# Patient Record
Sex: Male | Born: 1935 | Race: Black or African American | Hispanic: No | Marital: Married | Smoking: Former smoker
Health system: Southern US, Community
[De-identification: ages and names within clinical notes are randomized; demographics above are authoritative.]

## PROBLEM LIST (undated history)

## (undated) DIAGNOSIS — I679 Cerebrovascular disease, unspecified: Secondary | ICD-10-CM

## (undated) DIAGNOSIS — G459 Transient cerebral ischemic attack, unspecified: Secondary | ICD-10-CM

## (undated) DIAGNOSIS — I639 Cerebral infarction, unspecified: Secondary | ICD-10-CM

## (undated) DIAGNOSIS — G20 Parkinson's disease: Secondary | ICD-10-CM

## (undated) DIAGNOSIS — F0390 Unspecified dementia without behavioral disturbance: Secondary | ICD-10-CM

## (undated) DIAGNOSIS — M5090 Cervical disc disorder, unspecified, unspecified cervical region: Secondary | ICD-10-CM

## (undated) DIAGNOSIS — C801 Malignant (primary) neoplasm, unspecified: Secondary | ICD-10-CM

---

## 2009-01-13 ENCOUNTER — Encounter: Payer: Self-pay | Admitting: Gastroenterology

## 2009-01-13 ENCOUNTER — Ambulatory Visit: Payer: Self-pay | Admitting: Gastroenterology

## 2009-01-13 DIAGNOSIS — Z8719 Personal history of other diseases of the digestive system: Secondary | ICD-10-CM

## 2009-01-13 DIAGNOSIS — R112 Nausea with vomiting, unspecified: Secondary | ICD-10-CM | POA: Insufficient documentation

## 2009-01-13 DIAGNOSIS — R109 Unspecified abdominal pain: Secondary | ICD-10-CM

## 2009-01-14 ENCOUNTER — Encounter: Payer: Self-pay | Admitting: Gastroenterology

## 2009-01-16 ENCOUNTER — Encounter: Payer: Self-pay | Admitting: Gastroenterology

## 2009-01-21 ENCOUNTER — Encounter: Payer: Self-pay | Admitting: Gastroenterology

## 2009-01-21 ENCOUNTER — Ambulatory Visit: Payer: Self-pay | Admitting: Gastroenterology

## 2009-01-21 ENCOUNTER — Ambulatory Visit (HOSPITAL_COMMUNITY): Admission: RE | Admit: 2009-01-21 | Discharge: 2009-01-21 | Payer: Self-pay | Admitting: Gastroenterology

## 2009-01-26 LAB — CONVERTED CEMR LAB
ALT: 18 units/L (ref 0–53)
AST: 24 units/L (ref 0–37)
Amylase: 143 units/L — ABNORMAL HIGH (ref 0–105)
BUN: 11 mg/dL (ref 6–23)
Eosinophils Absolute: 0.1 10*3/uL (ref 0.0–0.7)
Hemoglobin: 13.4 g/dL (ref 13.0–17.0)
Lymphocytes Relative: 17 % (ref 12–46)
Lymphs Abs: 0.9 10*3/uL (ref 0.7–4.0)
Monocytes Absolute: 0.8 10*3/uL (ref 0.1–1.0)
Neutrophils Relative %: 64 % (ref 43–77)
Platelets: 266 10*3/uL (ref 150–400)
Potassium: 5.1 meq/L (ref 3.5–5.3)
RBC: 4.58 M/uL (ref 4.22–5.81)
RDW: 13.9 % (ref 11.5–15.5)
Sodium: 134 meq/L — ABNORMAL LOW (ref 135–145)
Total Protein: 7.9 g/dL (ref 6.0–8.3)
WBC: 5.3 10*3/uL (ref 4.0–10.5)

## 2009-02-04 ENCOUNTER — Telehealth: Payer: Self-pay | Admitting: Gastroenterology

## 2009-03-19 ENCOUNTER — Ambulatory Visit: Payer: Self-pay | Admitting: Gastroenterology

## 2009-03-23 ENCOUNTER — Ambulatory Visit (HOSPITAL_COMMUNITY): Admission: RE | Admit: 2009-03-23 | Discharge: 2009-03-23 | Payer: Self-pay | Admitting: Gastroenterology

## 2009-05-01 ENCOUNTER — Encounter: Payer: Self-pay | Admitting: Gastroenterology

## 2009-05-06 ENCOUNTER — Ambulatory Visit: Payer: Self-pay | Admitting: Gastroenterology

## 2009-05-06 DIAGNOSIS — R110 Nausea: Secondary | ICD-10-CM

## 2009-05-11 DIAGNOSIS — R6881 Early satiety: Secondary | ICD-10-CM

## 2009-05-12 LAB — CONVERTED CEMR LAB
Cortisol - AM: 14.4 ug/dL (ref 4.3–22.4)
Hgb A1c MFr Bld: 6.2 % — ABNORMAL HIGH (ref 4.6–6.1)
TSH: 1.406 microintl units/mL (ref 0.350–4.500)

## 2009-05-18 ENCOUNTER — Encounter (HOSPITAL_COMMUNITY): Admission: RE | Admit: 2009-05-18 | Discharge: 2009-06-17 | Payer: Self-pay | Admitting: Gastroenterology

## 2009-05-18 ENCOUNTER — Encounter: Payer: Self-pay | Admitting: Infectious Diseases

## 2009-06-01 ENCOUNTER — Ambulatory Visit: Payer: Self-pay | Admitting: Gastroenterology

## 2009-06-01 ENCOUNTER — Ambulatory Visit (HOSPITAL_COMMUNITY): Admission: RE | Admit: 2009-06-01 | Discharge: 2009-06-01 | Payer: Self-pay | Admitting: Gastroenterology

## 2009-06-24 ENCOUNTER — Encounter: Payer: Self-pay | Admitting: Gastroenterology

## 2009-10-03 ENCOUNTER — Ambulatory Visit: Payer: Self-pay | Admitting: Cardiology

## 2010-12-14 NOTE — Op Note (Signed)
NAME:  Jeffrey Blair, Jeffrey Blair NO.:  [id number]   MEDICAL RECORD NO.:  [id number]          PATIENT TYPE:  AMB   LOCATION:  DAY                           FACILITY:  [facility]   PHYSICIAN:  Kassie Mends, M.D.      DATE OF BIRTH:  [birth year]   DATE OF PROCEDURE:  DATE OF DISCHARGE:  01/21/2009                               OPERATIVE REPORT   REFERRING Nawaal Alling:  Dr. Charolette Forward   PROCEDURE:  Given capsule endoscopy.   INDICATION FOR EXAM:  [name] is a 77 year old male who presented  with weight loss, nausea, vomiting, and black stools, as well as  abdominal pain.  He had upper endoscopy, which showed small hiatal  hernia, otherwise is unremarkable, given capsule was released into the  second portion of the duodenum.   PATIENT DATA:  Height 74 inches, weight 180 pounds, waist 38, build  normal.  Gastric passage time 0-minute.  Small bowel passage time 4  hours and 55 minutes.   FINDINGS:  No old blood or fresh blood seen in the small intestines.  View slightly obscured for 1 hour to 2 hour due to retained luminal  contents.  Otherwise, no evidence of masses, ulcers, AVMs, or strictures  identified.   DIAGNOSIS:  No source for abdominal pain, nausea, vomiting, black stools  identified.   RECOMMENDATIONS:  [name] has had an extensive GI evaluation to  include colonoscopy and upper endoscopy, most recent is 2008.  He has a  CT scan in May 2010, which showed no acute intraabdominal process.  He  still have biopsies pending from his upper endoscopy.  No additional GI  workup recommended.  If his abdominal pain persists, then would proceed  with exploratory laparotomy.   PATH:  REACTIVE GASTROPATHY. Treated empirically for chronic prostatitis  w/ CIP x 21 days.      Kassie Mends, M.D.  Electronically Signed     SM/MEDQ  D:  01/26/2009  T:  01/27/2009  Job:  [id number]   cc:   Dr. Charolette Forward

## 2010-12-14 NOTE — Op Note (Signed)
NAME:  BERLE, FITZ NO.:  [id number]   MEDICAL RECORD NO.:  [id number]          PATIENT TYPE:  AMB   LOCATION:  DAY                           FACILITY:  [facility]   PHYSICIAN:  Kassie Mends, M.D.      DATE OF BIRTH:  11/13/1927   DATE OF PROCEDURE:  01/21/2009  DATE OF DISCHARGE:  01/21/2009                               OPERATIVE REPORT   REFERRING Jaymen Fetch:  Dr. Charolette Forward in [place], [facility].   PROCEDURE:  Esophagogastroduodenoscopy with cold forceps biopsy and  Givens capsule placement.   INDICATION FOR EXAM:  [name] is a 77 year old male, who presents  with abdominal pain, weight loss, nausea, vomiting, and black tarry  stools.  He reports having blood in his emesis at times.  He has lost 15  pounds.  His black stools were associated with Pepto-Bismol.  He has had  an evaluation at [facility] which include a CT scan of  the abdomen and pelvis on Dec 21, 2008, which showed a thickened distal  esophagus, otherwise was unremarkable.  He has CT angio that showed a  T11 compression fracture remote.  He has had normal liver enzymes,  mildly elevated amylase, and a lipase that was normal.  His hemoglobin  was 12.7 in May 2010.  He has been treated for H. pylori in the past  based on a serology.  He actually had a quarter found in his rectum.  His son, Abdulah Iqbal, helps to manage his medical care.  He denies  any problems swallowing.  He does not use aspirin, BC Goody's powders,  ibuprofen Motrin, or Aleve.  He is on omeprazole twice a day.  He takes  Remeron 30 mg at bedtime.  The patient's last colonoscopy was in  December 2001 that showed mild patchy erythema in the sigmoid colon and  the rectum.  He was treated with mesalamine enemas for 1 month.  He also  had a colonoscopy in August 2008.  He had an upper endoscopy in August  2008 that showed active gastritis.   FINDINGS:  1. Normal esophagus without evidence of  Barrett's mass, erosion,      ulceration, or stricture.  2. A 2-3 cm hiatal hernia.  Linear erosions in the antrum.  Biopsies      obtained via cold forceps to evaluate for H. pylori gastritis.  3. Normal duodenal bulb and second portion of the duodenum.  4. Given capsule released into the second portion of the duodenum.   DIAGNOSIS:  No obvious source for nausea or vomiting identified.  The  patient does have mild gastritis.   RECOMMENDATIONS:  1. Follow dietary instructions for post capsule endoscopy placement.  2. He should add Boost or [employer] Breakfast 3 times a day.  3. He will return to capsule today.  The results would be available      prior to Monday.  4. He should take his omeprazole 20 mg 30 minutes prior to first and      last meal.  We will call him with the results of his biopsies.  5. He has a follow up appointment in 1 month with Dr. Cira Servant regarding      his abdominal pain.  6. No aspirin, NSAIDs, or anticoagulation for 7 days.  He is also      given a handout on gastritis.   MEDICATIONS:  1. Demerol 50 mg IV.  2. Versed 3 mg IV.   PROCEDURE TECHNIQUE:  Physical exam was performed.  Informed consent was  obtained from the patient after explaining the benefits, risks, and  alternatives to the procedure.  The patient was connected to the monitor  and placed in left lateral position.  Continuous oxygen was provided by  nasal cannula and IV medicine administered through an indwelling  cannula.  After administration of sedation, the patient's esophagus was  intubated and scope was advanced under direct visualization to the  second portion of the duodenum.  The scope was removed slowly by  carefully examining the color, texture, anatomy, and integrity of the  mucosa on the way out.  Capsule was introduced under limited  visualization into the second portion of the duodenum.  The scope was  withdrawn.  The patient's esophagus was then again intubated with  the  diagnostic gastroscope and the scope was advanced to the antrum.  Biopsies were obtained.  The scope was removed slowly by carefully  examining the color, texture, anatomy, and integrity of the mucosa on  the way out.  The patient was recovered in endoscopy and discharged home  in satisfactory condition.   PATH:  Reactive gastropathy      Kassie Mends, M.D.  Electronically Signed     SM/MEDQ  D:  01/21/2009  T:  01/22/2009  Job:  [id number]   cc:   Charolette Forward

## 2011-10-03 ENCOUNTER — Encounter (HOSPITAL_COMMUNITY): Payer: Self-pay | Admitting: *Deleted

## 2011-10-03 ENCOUNTER — Emergency Department (HOSPITAL_COMMUNITY)
Admission: EM | Admit: 2011-10-03 | Discharge: 2011-10-03 | Disposition: A | Discharge status: Home / Self Care | Payer: Medicare Other | Attending: Emergency Medicine | Admitting: Emergency Medicine

## 2011-10-03 ENCOUNTER — Emergency Department (HOSPITAL_COMMUNITY): Payer: Medicare Other

## 2011-10-03 DIAGNOSIS — R609 Edema, unspecified: Secondary | ICD-10-CM | POA: Insufficient documentation

## 2011-10-03 DIAGNOSIS — M79609 Pain in unspecified limb: Secondary | ICD-10-CM | POA: Insufficient documentation

## 2011-10-03 DIAGNOSIS — L03119 Cellulitis of unspecified part of limb: Secondary | ICD-10-CM | POA: Insufficient documentation

## 2011-10-03 DIAGNOSIS — L02419 Cutaneous abscess of limb, unspecified: Secondary | ICD-10-CM | POA: Insufficient documentation

## 2011-10-03 DIAGNOSIS — L03116 Cellulitis of left lower limb: Secondary | ICD-10-CM

## 2011-10-03 DIAGNOSIS — Z8546 Personal history of malignant neoplasm of prostate: Secondary | ICD-10-CM | POA: Insufficient documentation

## 2011-10-03 HISTORY — DX: Parkinson's disease: G20

## 2011-10-03 HISTORY — DX: Unspecified dementia, unspecified severity, without behavioral disturbance, psychotic disturbance, mood disturbance, and anxiety: F03.90

## 2011-10-03 HISTORY — DX: Malignant (primary) neoplasm, unspecified: C80.1

## 2011-10-03 LAB — CBC
HCT: 36.3 % — ABNORMAL LOW (ref 39.0–52.0)
Hemoglobin: 12.4 g/dL — ABNORMAL LOW (ref 13.0–17.0)
MCHC: 34.2 g/dL (ref 30.0–36.0)
Platelets: 203 10*3/uL (ref 150–400)
RBC: 4.21 MIL/uL — ABNORMAL LOW (ref 4.22–5.81)
RDW: 13 % (ref 11.5–15.5)
WBC: 5.5 10*3/uL (ref 4.0–10.5)

## 2011-10-03 LAB — URINALYSIS, ROUTINE W REFLEX MICROSCOPIC
Bilirubin Urine: NEGATIVE
Ketones, ur: NEGATIVE mg/dL
Leukocytes, UA: NEGATIVE
Nitrite: NEGATIVE
Protein, ur: NEGATIVE mg/dL
Specific Gravity, Urine: 1.014 (ref 1.005–1.030)
Urobilinogen, UA: 0.2 mg/dL (ref 0.0–1.0)

## 2011-10-03 LAB — COMPREHENSIVE METABOLIC PANEL WITH GFR
ALT: <5 U/L (ref 0–53)
AST: 13 U/L (ref 0–37)
Albumin: 3.4 g/dL — ABNORMAL LOW (ref 3.5–5.2)
Alkaline Phosphatase: 116 U/L (ref 39–117)
BUN: 13 mg/dL (ref 6–23)
CO2: 31 meq/L (ref 19–32)
Calcium: 9.3 mg/dL (ref 8.4–10.5)
Chloride: 98 meq/L (ref 96–112)
Creatinine, Ser: 0.67 mg/dL (ref 0.50–1.35)
GFR calc Af Amer: >90 mL/min
GFR calc non Af Amer: 88 mL/min — ABNORMAL LOW
Glucose, Bld: 138 mg/dL — ABNORMAL HIGH (ref 70–99)
Potassium: 3.7 meq/L (ref 3.5–5.1)
Sodium: 137 meq/L (ref 135–145)
Total Bilirubin: 0.2 mg/dL — ABNORMAL LOW (ref 0.3–1.2)
Total Protein: 7.4 g/dL (ref 6.0–8.3)

## 2011-10-03 LAB — DIFFERENTIAL
Basophils Absolute: 0 K/uL (ref 0.0–0.1)
Basophils Relative: 0 % (ref 0–1)
Eosinophils Absolute: 0.1 K/uL (ref 0.0–0.7)
Eosinophils Relative: 2 % (ref 0–5)
Lymphocytes Relative: 9 % — ABNORMAL LOW (ref 12–46)
Lymphs Abs: 0.5 K/uL — ABNORMAL LOW (ref 0.7–4.0)
Monocytes Absolute: 0.5 K/uL (ref 0.1–1.0)
Monocytes Relative: 9 % (ref 3–12)
Neutro Abs: 4.4 K/uL (ref 1.7–7.7)
Neutrophils Relative %: 80 % — ABNORMAL HIGH (ref 43–77)

## 2011-10-03 LAB — PROCALCITONIN: Procalcitonin: <0.1 ng/mL

## 2011-10-03 MED ORDER — ACETAMINOPHEN 500 MG PO TABS
1000.0000 mg | ORAL_TABLET | Freq: Once | ORAL | Status: DC
  Filled 2011-10-03: qty 2

## 2011-10-03 MED ORDER — SODIUM CHLORIDE 0.9 % IV BOLUS (SEPSIS)
1000.0000 mL | Freq: Once | INTRAVENOUS | Status: AC
  Administered 2011-10-03: 1000 mL via INTRAVENOUS

## 2011-10-03 MED ORDER — HYDROCODONE-ACETAMINOPHEN 5-325 MG PO TABS
2.0000 | ORAL_TABLET | Freq: Once | ORAL | Status: AC
  Administered 2011-10-03: 2 via ORAL
  Filled 2011-10-03: qty 2

## 2011-10-03 MED ORDER — VANCOMYCIN HCL IN DEXTROSE 1-5 GM/200ML-% IV SOLN
1000.0000 mg | Freq: Once | INTRAVENOUS | Status: AC
  Administered 2011-10-03: 1000 mg via INTRAVENOUS
  Filled 2011-10-03: qty 200

## 2011-10-03 MED ORDER — DOXYCYCLINE HYCLATE 100 MG PO CAPS: 100.0000 mg | ORAL_CAPSULE | Freq: Two times a day (BID) | ORAL | Status: AC

## 2011-10-03 NOTE — H&P (Signed)
Requesting physician: Dr. Valma Cava  Reason for consultation: Cellulitis; concerns for inpatient vs outpatient treatment.  History of Present Illness: 78 y/o male with pmh significant for prostate cancer (in remission), parkinson disease and migraines; came to the hospital complaining of LLE pain, erythema and swelling. Patient in ED afebrile; no leukocytosis and with negative leg x-ray for osteomyelitis. Leg X-ray demonstrated and favor soft tissue cellulitis. At this point plan is to discharge patient home on doxycyline and follow up with PCP.  Allergies:   Allergies  Allergen Reactions  . Morphine   . Penicillins       Past Medical History  Diagnosis Date  . Cancer     prostate-in remission  . Dementia   . Parkinson disease   . Migraine     History reviewed. No pertinent past surgical history.  Scheduled Meds:   . HYDROcodone-acetaminophen  2 tablet Oral Once  . sodium chloride  1,000 mL Intravenous Once  . vancomycin  1,000 mg Intravenous Once  . DISCONTD: acetaminophen  1,000 mg Oral Once   Continuous Infusions:  PRN Meds:.  Social History:  reports that he has quit smoking. He does not have any smokeless tobacco history on file. He reports that he does not drink alcohol or use illicit drugs.  History reviewed. No pertinent family history.  Review of Systems:  Denies fever/chills, SOB, N/V, chest pain, abdominal pain or any other complaints. He reports mild anorexia for the last 2 days. Otherwise negative.  Physical Exam: Blood pressure 107/59, pulse 104, temperature 98.3 F (36.8 C), temperature source Oral, resp. rate 24, weight 72.576 kg (160 lb), SpO2 94.00%. GEN: in NAD; laying on bed cooperative with examination. Head: normocephalic, atraumatic. Eyes: no icterus, PERRLA, EOMI Oropharynx: no erythema and no exudate. Neck: supple, no thyromegaly Heart: Regular rhythm, no murmurs, S1 and S2. Resp: CTA bilaterally. Abdomen: soft, NT, ND, positive  BS. Extremities: LLE (anteriro aspect) with erythema, swelling and warm sensation; good pulses bilaterally and otherwise WNL. Neuro:AAOX3; no CN deficit; MS 4/5 bilaterally and simetrically. Psych: appropriate.   Labs on Admission:  Results for orders placed during the hospital encounter of 10/03/11 (from the past 48 hour(s))  CBC     Status: Abnormal   Collection Time   10/03/11 11:55 AM      Component Value Range Comment   WBC 5.5  4.0 - 10.5 (K/uL)    RBC 4.21 (*) 4.22 - 5.81 (MIL/uL)    Hemoglobin 12.4 (*) 13.0 - 17.0 (g/dL)    HCT 16.1 (*) 09.6 - 52.0 (%)    MCV 86.2  78.0 - 100.0 (fL)    MCH 29.5  26.0 - 34.0 (pg)    MCHC 34.2  30.0 - 36.0 (g/dL)    RDW 04.5  40.9 - 81.1 (%)    Platelets 203  150 - 400 (K/uL)   DIFFERENTIAL     Status: Abnormal   Collection Time   10/03/11 11:55 AM      Component Value Range Comment   Neutrophils Relative 80 (*) 43 - 77 (%)    Neutro Abs 4.4  1.7 - 7.7 (K/uL)    Lymphocytes Relative 9 (*) 12 - 46 (%)    Lymphs Abs 0.5 (*) 0.7 - 4.0 (K/uL)    Monocytes Relative 9  3 - 12 (%)    Monocytes Absolute 0.5  0.1 - 1.0 (K/uL)    Eosinophils Relative 2  0 - 5 (%)    Eosinophils Absolute 0.1  0.0 -  0.7 (K/uL)    Basophils Relative 0  0 - 1 (%)    Basophils Absolute 0.0  0.0 - 0.1 (K/uL)   COMPREHENSIVE METABOLIC PANEL     Status: Abnormal   Collection Time   10/03/11 11:55 AM      Component Value Range Comment   Sodium 137  135 - 145 (mEq/L)    Potassium 3.7  3.5 - 5.1 (mEq/L)    Chloride 98  96 - 112 (mEq/L)    CO2 31  19 - 32 (mEq/L)    Glucose, Bld 138 (*) 70 - 99 (mg/dL)    BUN 13  6 - 23 (mg/dL)    Creatinine, Ser 1.61  0.50 - 1.35 (mg/dL)    Calcium 9.3  8.4 - 10.5 (mg/dL)    Total Protein 7.4  6.0 - 8.3 (g/dL)    Albumin 3.4 (*) 3.5 - 5.2 (g/dL)    AST 13  0 - 37 (U/L)    ALT <5  0 - 53 (U/L) REPEATED TO VERIFY   Alkaline Phosphatase 116  39 - 117 (U/L)    Total Bilirubin 0.2 (*) 0.3 - 1.2 (mg/dL)    GFR calc non Af Amer 88 (*) >90  (mL/min)    GFR calc Af Amer >90  >90 (mL/min)   LACTIC ACID, PLASMA     Status: Normal   Collection Time   10/03/11 11:55 AM      Component Value Range Comment   Lactic Acid, Venous 1.6  0.5 - 2.2 (mmol/L)   PROCALCITONIN     Status: Normal   Collection Time   10/03/11 11:55 AM      Component Value Range Comment   Procalcitonin <0.10     URINALYSIS, ROUTINE W REFLEX MICROSCOPIC     Status: Normal   Collection Time   10/03/11  1:17 PM      Component Value Range Comment   Color, Urine YELLOW  YELLOW     APPearance CLEAR  CLEAR     Specific Gravity, Urine 1.014  1.005 - 1.030     pH 6.5  5.0 - 8.0     Glucose, UA NEGATIVE  NEGATIVE (mg/dL)    Hgb urine dipstick NEGATIVE  NEGATIVE     Bilirubin Urine NEGATIVE  NEGATIVE     Ketones, ur NEGATIVE  NEGATIVE (mg/dL)    Protein, ur NEGATIVE  NEGATIVE (mg/dL)    Urobilinogen, UA 0.2  0.0 - 1.0 (mg/dL)    Nitrite NEGATIVE  NEGATIVE     Leukocytes, UA NEGATIVE  NEGATIVE  MICROSCOPIC NOT DONE ON URINES WITH NEGATIVE PROTEIN, BLOOD, LEUKOCYTES, NITRITE, OR GLUCOSE <1000 mg/dL.    Radiological Exams on Admission: Dg Tibia/fibula Left  10/03/2011  *RADIOLOGY REPORT*  Clinical Data: Left lower anterior leg swelling and redness.  LEFT TIBIA AND FIBULA - 2 VIEW  Comparison: None.  Findings: Healed fracture deformities of the tibia and fibula are seen.  There is extensive bony overgrowth and heterotopic ossification.  Osteopenia is seen in the ankle and osteopenia. Soft tissue swelling is seen along the anterior aspect of the lower leg.  Degenerative changes in the ankle.  Vascular calcifications.  IMPRESSION:  1.  Soft tissue swelling along the anterior aspect of the lower leg without underlying evidence of osteomyelitis.  Cellulitis is suggested. 2.  Healed fracture deformities of the tibia and fibula with extensive bony overgrowth and heterotopic ossification.  Original Report Authenticated By: Reyes Ivan, M.D.    Assessment/Plan 1-LLE cellulitis:  no signs of osteomyelitis or extensive tendosynovitis or myositis. Will treat with doxycycline PO for 10 days; provide instructions to keep himself hydrated and to follow with PCP.  The rest of his medical problems remains stable and he will continue current home regimen.  Time Spent on Admission: 55 minutes  Jaceon Heiberger [facility] [phone]  10/03/2011, 3:11 PM

## 2011-10-03 NOTE — Discharge Instructions (Signed)
Cellulitis Cellulitis is an infection of the skin and the tissue beneath it. The area is typically red and tender. It is caused by germs (bacteria) (usually staph or strep) that enter the body through cuts or sores. Cellulitis most commonly occurs in the arms or lower legs.  HOME CARE INSTRUCTIONS   If you are given a prescription for medications which kill germs (antibiotics), take as directed until finished.   If the infection is on the arm or leg, keep the limb elevated as able.   Use a cold cloth several times per day to relieve pain and encourage healing.   See your caregiver for recheck of the infected site as directed if problems arise.   Only take over-the-counter or prescription medicines for pain, discomfort, or fever as directed by your caregiver.  SEEK MEDICAL CARE IF:   The area of redness (inflammation) is spreading, there are red streaks coming from the infected site, or if a part of the infection begins to turn dark in color.   The joint or bone underneath the infected skin becomes painful after the skin has healed.   The infection returns in the same or another area after it seems to have gone away.   A boil or bump swells up. This may be an abscess.   New, unexplained problems such as pain or fever develop.  SEEK IMMEDIATE MEDICAL CARE IF:   You have a fever.   You or your child feels drowsy or lethargic.   There is vomiting, diarrhea, or lasting discomfort or feeling ill (malaise) with muscle aches and pains.  MAKE SURE YOU:   Understand these instructions.   Will watch your condition.  Will get help right away if you are not doing well or get worse. Cellulitis Cellulitis is an infection of the tissue under the skin. The infected area is usually red and tender. This is caused by germs. These germs enter the body through cuts or sores. This usually happens in the arms or lower legs. HOME CARE   Take your medicine as told. Finish it even if you start to feel  better.   If the infection is on the arm or leg, keep it raised (elevated).   Use a warm cloth on the infected area several times a day.   See your doctor for a follow-up visit as told.  GET HELP RIGHT AWAY IF:   You are tired or confused.   You throw up (vomit).   You have watery poop (diarrhea).   You feel ill and have muscle aches.   You have a fever.  MAKE SURE YOU:   Understand these instructions.   Will watch your condition.   Will get help right away if you are not doing well or get worse.  Document Released: 01/04/2008 Document Revised: 07/07/2011 Document Reviewed: 06/19/2009  [facility] Patient Information 2012 [place], [place].

## 2011-10-03 NOTE — ED Provider Notes (Addendum)
History     CSN: [id number]  Arrival date & time 10/03/11  1057   First MD Initiated Contact with Patient 10/03/11 1130      Chief Complaint  Patient presents with  . Leg Pain    pt c/o left leg pain, was referred here by urgent care PA to r/o osteomyelitis. pt c/o pain to palpation and with ambulation. son also reports pt had bone graft years ago in same leg.   Pt has pain in L tib fib for several weeks.  H/o bone graft from football accident in 73's.  Worsening redness and pain in past few days.  Seen at [facility] yesterday, but did not come in until today. No fevers, no vomiting. Lives at home with spouse. Patient denies any recent injury. He has had no open sores. He does normally followed at the [facility] in [place]. Patient states he is "allergic" to morphine, and penicillin, but does not know the exact reaction. No reported history of anaphylaxis. Denies any abdominal or chest pain. No back pain or dysuria.  (Consider location/radiation/quality/duration/timing/severity/associated sxs/prior treatment) HPI  Past Medical History  Diagnosis Date  . Cancer     prostate-in remission  . Dementia   . Parkinson disease   . Migraine     History reviewed. No pertinent past surgical history.  History reviewed. No pertinent family history.  History  Substance Use Topics  . Smoking status: Former Games developer  . Smokeless tobacco: Not on file  . Alcohol Use: No      Review of Systems  All other systems reviewed and are negative.    Allergies  Morphine and Penicillins  Home Medications  No current outpatient prescriptions on file.  BP 107/59  Pulse 104  Temp(Src) 98.3 F (36.8 C) (Oral)  Resp 24  Wt 160 lb (72.576 kg)  SpO2 94%  Physical Exam  Nursing note and vitals reviewed. Constitutional: He is oriented to person, place, and time. He appears well-developed and well-nourished.  HENT:  Head: Normocephalic and atraumatic.  Eyes: Conjunctivae and EOM are normal.  Pupils are equal, round, and reactive to light.  Neck: Neck supple.  Cardiovascular: Normal rate and regular rhythm.  Exam reveals no gallop and no friction rub.   No murmur heard. Pulmonary/Chest: Breath sounds normal. He has no wheezes. He has no rales. He exhibits no tenderness.  Abdominal: Soft. Bowel sounds are normal. He exhibits no distension. There is no tenderness. There is no rebound and no guarding.  Musculoskeletal: Normal range of motion. He exhibits edema and tenderness.       Abnormal bony contour and left anterior tib-fib from previous surgery. Surrounding erythema and tenderness. No obvious crepitance. Distal pulses are intact. No open sores appreciated. Diffusely tender.  Neurological: He is alert and oriented to person, place, and time. No cranial nerve deficit. Coordination normal.  Skin: Skin is warm and dry. No rash noted.  Psychiatric: He has a normal mood and affect.    ED Course  Procedures (including critical care time)   Labs Reviewed  CBC  DIFFERENTIAL  COMPREHENSIVE METABOLIC PANEL  LACTIC ACID, PLASMA  PROCALCITONIN  CULTURE, BLOOD (ROUTINE X 2)  CULTURE, BLOOD (ROUTINE X 2)  URINALYSIS, ROUTINE W REFLEX MICROSCOPIC  URINE CULTURE   No results found.   No diagnosis found.    MDM  Pt is seen and examined;  Initial history and physical completed.  Will follow.   Initial HR of 120.  Initial BP of 107.  IVF, blood cx, imaging, labs, emperic ABX, pain meds.  Likely will need admission.   Will follow.      Horice Carrero A. Patrica Duel, MD 10/03/11 1141  3:12 PM  Discussed with [facility].  I felt the patient should be admitted.  The hospitalist states that they have seen. The patient and performed a full valuation. They felt the patient could go home. I respectfully disagreed. I told the [facility]ist that they could send the patient home, but I recommended that he be fitted. Disposition will be per [facility].  Lorelle Gibbs. Patrica Duel,  MD 10/03/11 [phone]

## 2011-10-03 NOTE — Discharge Summary (Signed)
Physician Discharge Summary  Patient ID: Jeffrey Blair MRN: [id number] DOB/AGE: [id number] 76 y.o.  Admit date: 10/03/2011 Discharge date: 10/03/2011  Primary Care Physician:  No primary provider on file.   Discharge Diagnoses:   1-LLE cellulitis 2-GERD 3-BPH 4-depression/anxiety 5-Parkinson's disease 6-HTN 7-HLD  Medication List  As of 10/03/2011  4:25 PM   TAKE these medications         citalopram 40 MG tablet   Commonly known as: CELEXA   Take 40 mg by mouth daily.      doxycycline 100 MG capsule   Commonly known as: VIBRAMYCIN   Take 1 capsule (100 mg total) by mouth 2 (two) times daily.      LORazepam 0.5 MG tablet   Commonly known as: ATIVAN   Take 0.5 mg by mouth 2 (two) times daily as needed. For anxiety and tremor      mulitivitamin with minerals Tabs   Take 1 tablet by mouth daily.      primidone 250 MG tablet   Commonly known as: MYSOLINE   Take 250 mg by mouth 2 (two) times daily.      propranolol 80 MG tablet   Commonly known as: INDERAL   Take 80 mg by mouth daily.      ranitidine 300 MG capsule   Commonly known as: ZANTAC   Take 300 mg by mouth at bedtime as needed.      simvastatin 20 MG tablet   Commonly known as: ZOCOR   Take 20 mg by mouth every evening.      terazosin 2 MG capsule   Commonly known as: HYTRIN   Take 2 mg by mouth at bedtime.      triamterene-hydrochlorothiazide 75-50 MG per tablet   Commonly known as: MAXZIDE   Take 1 tablet by mouth daily.      vitamin B-12 500 MCG tablet   Commonly known as: CYANOCOBALAMIN   Take 500 mcg by mouth daily.             Disposition and Follow-up:  Patient discharge home on PO antibiotics using doxycycline for 10 days and physical measures to help healing his LEG cellulitis. Advise to arrange visit with PCP in 10 days for follow up.  Consults:  None  Significant Diagnostic Studies:  Dg Tibia/fibula Left  10/03/2011  *RADIOLOGY REPORT*  Clinical Data: Left lower anterior leg  swelling and redness.  LEFT TIBIA AND FIBULA - 2 VIEW  Comparison: None.  Findings: Healed fracture deformities of the tibia and fibula are seen.  There is extensive bony overgrowth and heterotopic ossification.  Osteopenia is seen in the ankle and osteopenia. Soft tissue swelling is seen along the anterior aspect of the lower leg.  Degenerative changes in the ankle.  Vascular calcifications.  IMPRESSION:  1.  Soft tissue swelling along the anterior aspect of the lower leg without underlying evidence of osteomyelitis.  Cellulitis is suggested. 2.  Healed fracture deformities of the tibia and fibula with extensive bony overgrowth and heterotopic ossification.  Original Report Authenticated By: Reyes Ivan, M.D.     Brief H and P: 78 y/o male with pmh significant for prostate cancer (in remission), parkinson disease and migraines; came to the hospital complaining of LLE pain, erythema and swelling. Patient in ED afebrile; no leukocytosis and with negative leg x-ray for osteomyelitis. Leg X-ray demonstrated and favor soft tissue cellulitis.  Assessment/Plan  1-LLE cellulitis: no signs of osteomyelitis or extensive tendosynovitis or myositis. Will treat with doxycycline  PO for 10 days; provide instructions to keep himself hydrated and to follow with PCP.  The rest of his medical problems remains stable and he will continue current home regimen.      Time spent on Discharge: 55 minutes  Signed: Shaughn Thomley 10/03/2011, 4:25 PM

## 2011-10-03 NOTE — ED Notes (Signed)
Pt. Is unable to use the restroom at this time. 

## 2011-10-03 NOTE — ED Notes (Signed)
Admitting MD at bedside.

## 2011-10-04 LAB — URINE CULTURE
Colony Count: NO GROWTH
Culture  Setup Time: 201303050120

## 2011-10-09 LAB — CULTURE, BLOOD (ROUTINE X 2)
Culture: NO GROWTH
Culture: NO GROWTH

## 2015-04-08 ENCOUNTER — Ambulatory Visit (INDEPENDENT_AMBULATORY_CARE_PROVIDER_SITE_OTHER): Payer: PPO | Admitting: Pulmonary Disease

## 2015-04-08 ENCOUNTER — Ambulatory Visit (INDEPENDENT_AMBULATORY_CARE_PROVIDER_SITE_OTHER)
Admission: RE | Admit: 2015-04-08 | Discharge: 2015-04-08 | Disposition: A | Discharge status: Home / Self Care | Payer: PPO | Source: Ambulatory Visit | Attending: Pulmonary Disease | Admitting: Pulmonary Disease

## 2015-04-08 ENCOUNTER — Encounter: Payer: Self-pay | Admitting: Pulmonary Disease

## 2015-04-08 VITALS — BP 122/68 | HR 109 | Ht 73.0 in | Wt 158.0 lb

## 2015-04-08 DIAGNOSIS — R0602 Shortness of breath: Secondary | ICD-10-CM | POA: Diagnosis not present

## 2015-04-08 DIAGNOSIS — R05 Cough: Secondary | ICD-10-CM | POA: Diagnosis not present

## 2015-04-08 DIAGNOSIS — R059 Cough, unspecified: Secondary | ICD-10-CM | POA: Insufficient documentation

## 2015-04-08 DIAGNOSIS — J411 Mucopurulent chronic bronchitis: Secondary | ICD-10-CM

## 2015-04-08 DIAGNOSIS — J449 Chronic obstructive pulmonary disease, unspecified: Secondary | ICD-10-CM | POA: Insufficient documentation

## 2015-04-08 NOTE — Progress Notes (Signed)
Subjective:    Patient ID: Jeffrey Blair, male    DOB: 08-18-1927, 81 y.o.   MRN: [id number]  HPI Chief Complaint  Patient presents with  . Advice Only    self referral for cough.  Pt c/o sob, prod cough with lots of white mucus X4-5 months    This is a very pleasant 81 year old veteran who smoked for many years throughout his life and quit approximately 13 years ago who comes to my clinic today for evaluation of shortness of breath and coughing spells. He has referred himself here though apparently this problem has recently been evaluated at the that [facility]. He says that as a child he had no respiratory illnesses and is not aware of being born prematurely or having an infection as a child. He served in [employer] without respiratory difficulty and remained active as a young adult. He ran a Chiropractor and according to his family who is with him today he's always been a very hard worker and very active. However in the last several months he's been experiencing significant cough with mucus production. He coughs throughout the course of the day that his daughter feels that it may be a little worse in the evenings. She says that he does cough up significant chunks of white or yellow mucus at a time. She says that he sometimes will have coughing spells that last for several minutes on and and are quite disturbing. She says that he does have cough in the morning but it's not usually as bad as the evening cough. It does not sound like he chokes on food in it's not clear if the cough is worse with eating. He does not have sinus congestion, postnasal drip or other sinus symptoms. He says that he does have an upset stomach from time to time but he denies overt heart burn symptoms. He has used albuterol from time to time which will help with the cough. He does not have chest pain or leg swelling. He says that he does have shortness of breath when he exerts himself. Specifically, climbing  a flight of stairs will make him short of breath. Carrying in groceries does not make him short of breath.  Past Medical History  Diagnosis Date  . Cancer     prostate-in remission  . Dementia   . Parkinson disease   . Migraine      Family History  Problem Relation Age of Onset  . Allergies Child   . Cancer Father     lung cancer  . Cancer Brother     colon  . Cancer Sister      Social History   Social History  . Marital Status: Married    Spouse Name: N/A  . Number of Children: N/A  . Years of Education: N/A   Occupational History  . Not on file.   Social History Main Topics  . Smoking status: Former Smoker -- 0.50 packs/day for 20 years    Types: Cigarettes    Quit date: 04/07/1985  . Smokeless tobacco: Never Used  . Alcohol Use: No  . Drug Use: No  . Sexual Activity: Not on file   Other Topics Concern  . Not on file   Social History Narrative     Allergies  Allergen Reactions  . Morphine   . Penicillins      Outpatient Prescriptions Prior to Visit  Medication Sig Dispense Refill  . primidone (MYSOLINE) 250 MG tablet Take 250 mg  by mouth 2 (two) times daily.    . ranitidine (ZANTAC) 300 MG capsule Take 300 mg by mouth at bedtime as needed.    . simvastatin (ZOCOR) 20 MG tablet Take 20 mg by mouth every evening.    . vitamin B-12 (CYANOCOBALAMIN) 500 MCG tablet Take 500 mcg by mouth daily.    . citalopram (CELEXA) 40 MG tablet Take 40 mg by mouth daily.    Marland Kitchen LORazepam (ATIVAN) 0.5 MG tablet Take 0.5 mg by mouth 2 (two) times daily as needed. For anxiety and tremor    . Multiple Vitamin (MULITIVITAMIN WITH MINERALS) TABS Take 1 tablet by mouth daily.    . propranolol (INDERAL) 80 MG tablet Take 80 mg by mouth daily.    Marland Kitchen terazosin (HYTRIN) 2 MG capsule Take 2 mg by mouth at bedtime.    . triamterene-hydrochlorothiazide (MAXZIDE) 75-50 MG per tablet Take 1 tablet by mouth daily.     No facility-administered medications prior to visit.     Review of  Systems  Constitutional: Negative for unexpected weight change.  HENT: Positive for congestion and sinus pressure. Negative for dental problem and nosebleeds.   Eyes: Negative for redness and itching.  Respiratory: Negative for chest tightness.   Cardiovascular: Negative for palpitations and leg swelling.  Gastrointestinal: Negative for nausea and vomiting.  Genitourinary: Negative for dysuria.  Musculoskeletal: Negative for joint swelling.  Skin: Negative for rash.  Hematological: Does not bruise/bleed easily.  Psychiatric/Behavioral: Negative for dysphoric mood. The patient is not nervous/anxious.        Objective:   Physical Exam Filed Vitals:   04/08/15 1427  BP: 122/68  Pulse: 109  Height: 6\' 1"  (1.854 m)  Weight: 158 lb (71.668 kg)  SpO2: 97%   RA  Gen: well appearing, no acute distress HENT: NCAT, OP clear, neck supple without masses Eyes: PERRL, EOMi Lymph: no cervical lymphadenopathy PULM: Fine crackles in the bases of both lungs, good air movement CV: RRR, no mgr, no JVD GI: BS+, soft, nontender, no hsm Derm: no rash or skin breakdown MSK: normal bulk and tone Neuro: A&Ox4, CN II-XII intact, strength 5/5 in all 4 extremities Psyche: normal mood and affect   May 2016 chest x-ray images personally reviewed showing possible emphysema, mild scarring in the apices of both lungs bilaterally, normal cardiac silhouette. 2013 discharge summary of uterus treated for cellulitis and acid reflux. He was on a proton pump inhibitor and H2 blocker at that time.      Assessment & Plan:  COPD (chronic obstructive pulmonary disease) Today we perform simple spirometry and I think because of his dementia he had difficulty with the test. The test did not meet ATS criteria for acceptability or reproducibility but the flow volume loop on several of the studies was suggestive of airflow obstruction. Considering his lengthy smoking history and shortness of breath with cough with mucus  production, COPD would be a very reasonable diagnosis here. However, I would like to see the results of the recent full pulmonary function test which was performed by the [facility].  The differential diagnosis includes an underlying interstitial lung disease given the crackles in the lungs. There are many non-pulmonary causes of dyspnea, and unforunately I don't have the benefit of his PCP or [facility] records to review.  Plan Obtain records from the [place] to see if the recent full pulmonary function test that was performed Chest x-ray Start Anoro 1 puff daily, samples provided. I will have him call to let  us know in a couple weeks how the prescription is working. At that point we will likely change him to [facility] because the [place] provides that medicine. (We did not have Spiriva samples today) Follow-up 6 weeks or sooner if needed  Cough We will start by treating COPD as detailed above. Continue acid reflux treatment as well. If no improvement then we will consider working him up for other pulmonary causes of cough including a potential CT scan of the chest.     Current outpatient prescriptions:  .  carbidopa-levodopa (SINEMET IR) 25-100 MG per tablet, Take 2 tablets by mouth 3 (three) times daily., Disp: , Rfl:  .  carboxymethylcellulose (REFRESH PLUS) 0.5 % SOLN, Place 1 drop into both eyes 4 (four) times daily as needed., Disp: , Rfl:  .  diclofenac (CATAFLAM) 50 MG tablet, Take 50 mg by mouth as needed., Disp: , Rfl:  .  loratadine (CLARITIN) 10 MG tablet, Take 10 mg by mouth daily as needed for allergies., Disp: , Rfl:  .  meclizine (ANTIVERT) 25 MG tablet, Take 25 mg by mouth 3 (three) times daily as needed for dizziness., Disp: , Rfl:  .  omeprazole (PRILOSEC) 20 MG capsule, Take 20 mg by mouth daily., Disp: , Rfl:  .  primidone (MYSOLINE) 250 MG tablet, Take 250 mg by mouth 2 (two) times daily., Disp: , Rfl:  .  ranitidine (ZANTAC) 300 MG capsule, Take 300 mg by mouth at bedtime as  needed., Disp: , Rfl:  .  simvastatin (ZOCOR) 20 MG tablet, Take 20 mg by mouth every evening., Disp: , Rfl:  .  topiramate (TOPAMAX) 25 MG capsule, Take 25 mg by mouth 2 (two) times daily., Disp: , Rfl:  .  traMADol (ULTRAM) 50 MG tablet, Take 50 mg by mouth 3 (three) times daily as needed., Disp: , Rfl:  .  venlafaxine (EFFEXOR) 37.5 MG tablet, Take 37.5 mg by mouth 2 (two) times daily with a meal., Disp: , Rfl:  .  vitamin B-12 (CYANOCOBALAMIN) 500 MCG tablet, Take 500 mcg by mouth daily., Disp: , Rfl:

## 2015-04-08 NOTE — Patient Instructions (Addendum)
Start Anoro one puff daily no matter how you feel We will request a copy of the lung function test from the [place] We will call you with the result of the CXR We will see you back in 6 weeks or sooner if needed

## 2015-04-08 NOTE — Assessment & Plan Note (Addendum)
Today we perform simple spirometry and I think because of his dementia he had difficulty with the test. The test did not meet ATS criteria for acceptability or reproducibility but the flow volume loop on several of the studies was suggestive of airflow obstruction. Considering his lengthy smoking history and shortness of breath with cough with mucus production, COPD would be a very reasonable diagnosis here. However, I would like to see the results of the recent full pulmonary function test which was performed by the [facility].  The differential diagnosis includes an underlying interstitial lung disease given the crackles in the lungs. There are many non-pulmonary causes of dyspnea, and unforunately I don't have the benefit of his PCP or [facility] records to review.  Plan Obtain records from the [place] to see if the recent full pulmonary function test that was performed Chest x-ray Start Anoro 1 puff daily, samples provided. I will have him call to let us know in a couple weeks how the prescription is working. At that point we will likely change him to [facility] because the [place] provides that medicine. (We did not have Spiriva samples today) Follow-up 6 weeks or sooner if needed

## 2015-04-08 NOTE — Assessment & Plan Note (Signed)
We will start by treating COPD as detailed above. Continue acid reflux treatment as well. If no improvement then we will consider working him up for other pulmonary causes of cough including a potential CT scan of the chest.

## 2015-04-20 ENCOUNTER — Telehealth: Payer: Self-pay | Admitting: Pulmonary Disease

## 2015-04-20 MED ORDER — UMECLIDINIUM-VILANTEROL 62.5-25 MCG/INH IN AEPB: 1.0000 | INHALATION_SPRAY | Freq: Every day | RESPIRATORY_TRACT | Status: DC

## 2015-04-20 NOTE — Telephone Encounter (Signed)
Rx printed and sample - both left in bag at front to be picked up Patient notified. Nothing further needed.

## 2015-04-20 NOTE — Telephone Encounter (Signed)
Spoke with Estill Bamberg, Pt's EC.  She states that pt has done good on the Anoro.  Still has phlegm in the chest and encouraging him to cough this up. They are requesting [facility] and rx for [facility].  Per BQ ov note: Obtain records from the [place] to see if the recent full pulmonary function test that was performed Chest x-ray Start Anoro 1 puff daily, samples provided. I will have him call to let us know in a couple weeks how the prescription is working. At that point we will likely change him to [facility] because the [place] provides that medicine. (We did not have Spiriva samples today) Follow-up 6 weeks or sooner if needed  Please advise what to give pt.

## 2015-04-20 NOTE — Telephone Encounter (Signed)
Well, we can give him the Anoro Rx and sample and then write a script for it to see if the [place] will prescribe it. If they don't then we will need to Rx Spiriva.

## 2015-04-21 ENCOUNTER — Telehealth: Payer: Self-pay | Admitting: Pulmonary Disease

## 2015-04-21 NOTE — Telephone Encounter (Signed)
Spoke with Estill Bamberg; states she forgot to inform BQ yesterday when she called; Pt is doing well on Anoro but continues to have cough; she has noticed that when he coughs he grabs his side as though he is in pain. Estill Bamberg would like to know what BQ suggests to help with this. Estill Bamberg is aware that BQ is seeing patients at this time and may not get a call back until Wednesday AM. BQ please advise on any other recommendations. Thanks.

## 2015-04-22 NOTE — Telephone Encounter (Signed)
ON 9/7 his chest x-ray didn't show any abnormalities to explain the pain.  Sometimes people can have a muscle strain or small rib fracture.  Would recommend warm compresses to the area and tylenol first.  If no improvement with tylenol then low dose ibuprofen 200-400mg  tid prn pain.  Need to clarify that he doesn't have a contraindication to ibuprofen (GI bleeding in past or kidney problems).  If no improvement then f/u with [place].

## 2015-04-22 NOTE — Telephone Encounter (Signed)
Called spoke with Estill Bamberg. Aware of recs. Nothing further needed

## 2015-04-23 ENCOUNTER — Telehealth: Payer: Self-pay | Admitting: Pulmonary Disease

## 2015-04-23 NOTE — Telephone Encounter (Signed)
Spoke with pt's daughter Estill Bamberg, acknowledged that she needs a hard copy of script.  Advised that BQ will be in office tomorrow to sign rx.   BQ please advise on rx alternative.  Thanks!

## 2015-04-23 NOTE — Telephone Encounter (Signed)
Patient daughter cb and states she needs a written rx to come pick up from our office, please do not sent to pharmacy. She has to take the rx to the [place], please cb at previous number listed

## 2015-04-23 NOTE — Telephone Encounter (Signed)
Spoke with pt's EC and she states the [place] doesn't carry Anoro and at the pharmacy it will be $400. She is asking if you will send something different due to these issues. Please advise.

## 2015-04-27 MED ORDER — TIOTROPIUM BROMIDE MONOHYDRATE 18 MCG IN CAPS: 18.0000 ug | ORAL_CAPSULE | Freq: Every day | RESPIRATORY_TRACT | Status: DC

## 2015-04-27 NOTE — Telephone Encounter (Signed)
rx has been signed and placed up front for pickup.  Nothing further needed.

## 2015-04-27 NOTE — Telephone Encounter (Signed)
Rx printed for Dr. Lake Bells to sign.  Put in Dr. Anastasia Pall box. Estill Bamberg has been notified and says she will pick up RX in the morning.  To Caryl Pina for follow up

## 2015-04-27 NOTE — Telephone Encounter (Signed)
It's probably just going to be Spiriva handihaler one puff daily

## 2015-04-27 NOTE — Telephone Encounter (Signed)
So write the Rx for Spiriva Handihaler 1 puff daily; however if they can come up with the [place] then I can write for something else, but I'm pretty sure that is still on formulary at the [place]

## 2015-05-14 ENCOUNTER — Telehealth: Payer: Self-pay | Admitting: Pulmonary Disease

## 2015-05-14 NOTE — Telephone Encounter (Signed)
The # to [place] is [phone] sorry # got missed eariler.Jeffrey Blair

## 2015-05-14 NOTE — Telephone Encounter (Signed)
Last ov note printed and faxed to the number given for [facility]

## 2015-05-14 NOTE — Telephone Encounter (Signed)
[facility]. Sent Dorothyann Peng a message advising of this. Will forward message back to her

## 2015-05-21 ENCOUNTER — Ambulatory Visit (INDEPENDENT_AMBULATORY_CARE_PROVIDER_SITE_OTHER): Payer: PPO | Admitting: Pulmonary Disease

## 2015-05-21 ENCOUNTER — Ambulatory Visit: Payer: Medicare Other | Admitting: Pulmonary Disease

## 2015-05-21 ENCOUNTER — Encounter: Payer: Self-pay | Admitting: Pulmonary Disease

## 2015-05-21 ENCOUNTER — Other Ambulatory Visit (INDEPENDENT_AMBULATORY_CARE_PROVIDER_SITE_OTHER): Payer: PPO

## 2015-05-21 VITALS — BP 134/64 | HR 98 | Ht 73.0 in | Wt 152.0 lb

## 2015-05-21 DIAGNOSIS — J411 Mucopurulent chronic bronchitis: Secondary | ICD-10-CM | POA: Diagnosis not present

## 2015-05-21 DIAGNOSIS — R1012 Left upper quadrant pain: Secondary | ICD-10-CM | POA: Diagnosis not present

## 2015-05-21 DIAGNOSIS — R109 Unspecified abdominal pain: Secondary | ICD-10-CM | POA: Insufficient documentation

## 2015-05-21 LAB — URINALYSIS
BILIRUBIN URINE: NEGATIVE
HGB URINE DIPSTICK: NEGATIVE
Ketones, ur: NEGATIVE
LEUKOCYTES UA: NEGATIVE
NITRITE: NEGATIVE
Specific Gravity, Urine: 1.02 (ref 1.000–1.030)
TOTAL PROTEIN, URINE-UPE24: NEGATIVE
Urine Glucose: NEGATIVE
Urobilinogen, UA: 0.2 (ref 0.0–1.0)
pH: 6.5 (ref 5.0–8.0)

## 2015-05-21 LAB — BASIC METABOLIC PANEL
BUN: 14 mg/dL (ref 6–23)
CO2: 30 mEq/L (ref 19–32)
Calcium: 9.7 mg/dL (ref 8.4–10.5)
Chloride: 103 mEq/L (ref 96–112)
Creatinine, Ser: 0.85 mg/dL (ref 0.40–1.50)
GFR: 110.13 mL/min (ref >60.00)
Glucose, Bld: 117 mg/dL — ABNORMAL HIGH (ref 70–99)
POTASSIUM: 3.9 meq/L (ref 3.5–5.1)
SODIUM: 140 meq/L (ref 135–145)

## 2015-05-21 NOTE — Progress Notes (Signed)
Subjective:    Patient ID: Jeffrey Blair, male    DOB: Sep 29, 1927, 81 y.o.   MRN: [id number]  Synopsis this is a very pleasant veteran who is seen for the first time in 2016 for COPD Simple spirometry testing was difficult for him to perform but the flow volume loop showed airflow obstruction so he was started on Spiriva in 2016.  HPI Chief Complaint  Patient presents with  . Follow-up    pt has not yet started spiriva d/t difficulty getting med filled through the [place].  pt c/o fatigue, R sided chest pain, DOE.     We did not receive the records from the [place] unfortunately. He took the [employer], but we needed to change his medications to Spiriva to match the [place]' medication formulary. His daughter has struggled to get his medication from the [place]. The inhaler does help his breathing a lot.  He can't afford the [place]. He still has a constant pain in the left side which has not improved.  It is dull left pain under his left rib that will come and go.  This has been progressive for over a month. Bowel movements normal.  + weight loss with that.  Past Medical History  Diagnosis Date  . Cancer ([place])     prostate-in remission  . Dementia   . Parkinson disease ([place])   . Migraine       Review of Systems  Constitutional: Negative for fever, chills and fatigue.  HENT: Negative for postnasal drip, rhinorrhea and sinus pressure.   Respiratory: Positive for shortness of breath. Negative for cough and wheezing.   Cardiovascular: Negative for chest pain, palpitations and leg swelling.  Gastrointestinal: Positive for abdominal pain.       Objective:   Physical Exam Filed Vitals:   05/21/15 1523  BP: 134/64  Pulse: 98  Height: 6\' 1"  (1.854 m)  Weight: 152 lb (68.947 kg)  SpO2: 98%   RA  Gen: well appearing HENT: OP clear, TM's clear, neck supple PULM: CTA B, normal percussion CV: RRR, no mgr, trace edema GI: mild tenderness left upper quadrant, no mass, BS+, soft,  nontender Derm: no cyanosis or rash Psyche: normal mood and affect  September 2016 chest x-ray images personally reviewed showing emphysema bilaterally Lung function testing September 2016 flow volume loop consistent with airflow obstruction      Assessment & Plan:  COPD (chronic obstructive pulmonary disease) His simple spirometry this year on flow volume loop showed airflow obstruction, his chest x-ray this year showed emphysema. He benefited initially from [facility] but this medication was not covered by the [place]. We will treat him with Spiriva as this is covered by the [place]. His flu shot is up-to-date.  Plan: Continue Spiriva Continue exercise  Abdominal pain He has been experiencing severe, pressure type coming and going pain in the left upper quadrant. This is a luminal type pain so it makes me worried about the possibility of either constipation, or more worrisome a ureteral obstruction or bowel obstruction of some sort. I'm concerned about it because of his ongoing weight loss. There is no palpable mass on exam today.  Plan: Check urinalysis for evidence of ureteral obstruction or lesion Check CT abdomen and pelvis     Current outpatient prescriptions:  .  carbidopa-levodopa (SINEMET IR) 25-100 MG per tablet, Take 2 tablets by mouth 3 (three) times daily., Disp: , Rfl:  .  carboxymethylcellulose (REFRESH PLUS) 0.5 % SOLN, Place 1 drop into both  eyes 4 (four) times daily as needed., Disp: , Rfl:  .  diclofenac (CATAFLAM) 50 MG tablet, Take 50 mg by mouth as needed., Disp: , Rfl:  .  loratadine (CLARITIN) 10 MG tablet, Take 10 mg by mouth daily as needed for allergies., Disp: , Rfl:  .  meclizine (ANTIVERT) 25 MG tablet, Take 25 mg by mouth 3 (three) times daily as needed for dizziness., Disp: , Rfl:  .  omeprazole (PRILOSEC) 20 MG capsule, Take 20 mg by mouth daily., Disp: , Rfl:  .  primidone (MYSOLINE) 250 MG tablet, Take 250 mg by mouth 2 (two) times daily., Disp: , Rfl:  .   ranitidine (ZANTAC) 300 MG capsule, Take 300 mg by mouth at bedtime as needed., Disp: , Rfl:  .  simvastatin (ZOCOR) 20 MG tablet, Take 20 mg by mouth every evening., Disp: , Rfl:  .  tiotropium (SPIRIVA) 18 MCG inhalation capsule, Place 1 capsule (18 mcg total) into inhaler and inhale daily., Disp: 30 capsule, Rfl: 12 .  topiramate (TOPAMAX) 25 MG capsule, Take 25 mg by mouth 2 (two) times daily., Disp: , Rfl:  .  traMADol (ULTRAM) 50 MG tablet, Take 50 mg by mouth 3 (three) times daily as needed., Disp: , Rfl:  .  Umeclidinium-Vilanterol (ANORO ELLIPTA) 62.5-25 MCG/INH AEPB, Inhale 1 puff into the lungs daily., Disp: 1 each, Rfl: 0 .  venlafaxine (EFFEXOR) 37.5 MG tablet, Take 37.5 mg by mouth 2 (two) times daily with a meal., Disp: , Rfl:  .  vitamin B-12 (CYANOCOBALAMIN) 500 MCG tablet, Take 500 mcg by mouth daily., Disp: , Rfl:

## 2015-05-21 NOTE — Patient Instructions (Signed)
Take Spiriva 1 puff daily We will order a CT scan of your abdomen and call you with the results We will see you back in 4 months or sooner if needed

## 2015-05-21 NOTE — Assessment & Plan Note (Signed)
He has been experiencing severe, pressure type coming and going pain in the left upper quadrant. This is a luminal type pain so it makes me worried about the possibility of either constipation, or more worrisome a ureteral obstruction or bowel obstruction of some sort. I'm concerned about it because of his ongoing weight loss. There is no palpable mass on exam today.  Plan: Check urinalysis for evidence of ureteral obstruction or lesion Check CT abdomen and pelvis

## 2015-05-21 NOTE — Assessment & Plan Note (Signed)
His simple spirometry this year on flow volume loop showed airflow obstruction, his chest x-ray this year showed emphysema. He benefited initially from [facility] but this medication was not covered by the [place]. We will treat him with Spiriva as this is covered by the [place]. His flu shot is up-to-date.  Plan: Continue Spiriva Continue exercise

## 2015-05-27 ENCOUNTER — Ambulatory Visit (INDEPENDENT_AMBULATORY_CARE_PROVIDER_SITE_OTHER)
Admission: RE | Admit: 2015-05-27 | Discharge: 2015-05-27 | Disposition: A | Discharge status: Home / Self Care | Payer: PPO | Source: Ambulatory Visit | Attending: Pulmonary Disease | Admitting: Pulmonary Disease

## 2015-05-27 DIAGNOSIS — R1012 Left upper quadrant pain: Secondary | ICD-10-CM

## 2015-05-27 MED ORDER — IOHEXOL 300 MG/ML  SOLN
100.0000 mL | Freq: Once | INTRAMUSCULAR | Status: AC | PRN
  Administered 2015-05-27: 100 mL via INTRAVENOUS

## 2015-09-24 ENCOUNTER — Ambulatory Visit: Payer: PPO | Admitting: Pulmonary Disease

## 2015-09-29 ENCOUNTER — Encounter: Payer: Self-pay | Admitting: Adult Health

## 2015-09-29 ENCOUNTER — Ambulatory Visit (INDEPENDENT_AMBULATORY_CARE_PROVIDER_SITE_OTHER): Payer: PPO | Admitting: Adult Health

## 2015-09-29 VITALS — BP 132/72 | HR 97 | Temp 98.2°F | Ht 73.0 in | Wt 162.0 lb

## 2015-09-29 DIAGNOSIS — J441 Chronic obstructive pulmonary disease with (acute) exacerbation: Secondary | ICD-10-CM | POA: Diagnosis not present

## 2015-09-29 MED ORDER — PREDNISONE 10 MG PO TABS: ORAL_TABLET | ORAL | Status: DC

## 2015-09-29 NOTE — Patient Instructions (Signed)
Continue on Spiriva daily , rinse after use.  Prednisone taper over next week.  Mucinex DM Twice daily  As needed cough and congestion  Follow up with Dr. Lake Bells in 2 months and As needed

## 2015-09-29 NOTE — Assessment & Plan Note (Addendum)
Flare  Plan  Continue on Spiriva daily , rinse after use.  Prednisone taper over next week.  Mucinex DM Twice daily  As needed cough and congestion  Follow up with Dr. Lake Bells in 2 months and As needed

## 2015-09-29 NOTE — Progress Notes (Signed)
Subjective:    Patient ID: Jeffrey Blair, male    DOB: May 16, 1928, 82 y.o.   MRN: [id number]  HPI 82 yo male former smoker with COPD  [place] gets meds thru  [place]   09/29/2015 Acute OV  Pt presents for an acute office visit.  Complains of few days of prod cough with minimal white /gray colored mucus, SOB and wheezing with activity.  Denies any chest tightness/congestion, sinus pressure/drainage, fever, nausea or vomiting.  On spiriva daily .    Past Medical History  Diagnosis Date  . Cancer ([place])     prostate-in remission  . Dementia   . Parkinson disease ([place])   . Migraine    Current Outpatient Prescriptions on File Prior to Visit  Medication Sig Dispense Refill  . carbidopa-levodopa (SINEMET IR) 25-100 MG per tablet Take 2 tablets by mouth 3 (three) times daily.    . carboxymethylcellulose (REFRESH PLUS) 0.5 % SOLN Place 1 drop into both eyes 4 (four) times daily as needed.    . diclofenac (CATAFLAM) 50 MG tablet Take 50 mg by mouth as needed.    . loratadine (CLARITIN) 10 MG tablet Take 10 mg by mouth daily as needed for allergies.    Marland Kitchen meclizine (ANTIVERT) 25 MG tablet Take 25 mg by mouth 3 (three) times daily as needed for dizziness.    Marland Kitchen omeprazole (PRILOSEC) 20 MG capsule Take 20 mg by mouth daily.    . primidone (MYSOLINE) 250 MG tablet Take 250 mg by mouth 2 (two) times daily.    . ranitidine (ZANTAC) 300 MG capsule Take 300 mg by mouth at bedtime as needed.    . simvastatin (ZOCOR) 20 MG tablet Take 20 mg by mouth every evening.    . tiotropium (SPIRIVA) 18 MCG inhalation capsule Place 1 capsule (18 mcg total) into inhaler and inhale daily. 30 capsule 12  . topiramate (TOPAMAX) 25 MG capsule Take 25 mg by mouth 2 (two) times daily.    . traMADol (ULTRAM) 50 MG tablet Take 50 mg by mouth 3 (three) times daily as needed.    Marland Kitchen Umeclidinium-Vilanterol (ANORO ELLIPTA) 62.5-25 MCG/INH AEPB Inhale 1 puff into the lungs daily. 1 each 0  . venlafaxine (EFFEXOR) 37.5 MG  tablet Take 37.5 mg by mouth 2 (two) times daily with a meal.    . vitamin B-12 (CYANOCOBALAMIN) 500 MCG tablet Take 500 mcg by mouth daily.     No current facility-administered medications on file prior to visit.      Review of Systems Constitutional:   No  weight loss, night sweats,  Fevers, chills,  +fatigue, or  lassitude.  HEENT:   No headaches,  Difficulty swallowing,  Tooth/dental problems, or  Sore throat,                No sneezing, itching, ear ache, nasal congestion, post nasal drip,   CV:  No chest pain,  Orthopnea, PND, swelling in lower extremities, anasarca, dizziness, palpitations, syncope.   GI  No heartburn, indigestion, abdominal pain, nausea, vomiting, diarrhea, change in bowel habits, loss of appetite, bloody stools.   Resp:  No chest wall deformity  Skin: no rash or lesions.  GU: no dysuria, change in color of urine, no urgency or frequency.  No flank pain, no hematuria   MS:  No joint pain or swelling.  No decreased range of motion.  No back pain.  Psych:  No change in mood or affect. No depression or anxiety.  No memory  loss.         Objective:   Physical Exam Filed Vitals:   09/29/15 1608  BP: 132/72  Pulse: 97  Temp: 98.2 F (36.8 C)  TempSrc: Oral  Height: 6\' 1"  (1.854 m)  Weight: 162 lb (73.483 kg)  SpO2: 98%  . GEN: A/Ox3; pleasant , NAD, elderly   HEENT:  [place]/AT,  EACs-clear, TMs-wnl, NOSE-clear, THROAT-clear, no lesions, no postnasal drip or exudate noted.   NECK:  Supple w/ fair ROM; no JVD; normal carotid impulses w/o bruits; no thyromegaly or nodules palpated; no lymphadenopathy.  RESP  Decreased BS in bases no accessory muscle use, no dullness to percussion  CARD:  RRR, no m/r/g  , no peripheral edema, pulses intact, no cyanosis or clubbing.  GI:   Soft & nt; nml bowel sounds; no organomegaly or masses detected.  Musco: Warm bil, no deformities or joint swelling noted.   Neuro: alert, no focal deficits noted.    Skin:  Warm, no lesions or rashes   Jakob Kimberlin NP-C  [facility]   09/29/2015      Assessment & Plan:

## 2015-11-27 ENCOUNTER — Ambulatory Visit (INDEPENDENT_AMBULATORY_CARE_PROVIDER_SITE_OTHER): Payer: PPO | Admitting: Pulmonary Disease

## 2015-11-27 ENCOUNTER — Other Ambulatory Visit (INDEPENDENT_AMBULATORY_CARE_PROVIDER_SITE_OTHER): Payer: PPO

## 2015-11-27 ENCOUNTER — Ambulatory Visit (INDEPENDENT_AMBULATORY_CARE_PROVIDER_SITE_OTHER)
Admission: RE | Admit: 2015-11-27 | Discharge: 2015-11-27 | Disposition: A | Discharge status: Home / Self Care | Payer: PPO | Source: Ambulatory Visit | Attending: Pulmonary Disease | Admitting: Pulmonary Disease

## 2015-11-27 ENCOUNTER — Encounter: Payer: Self-pay | Admitting: Pulmonary Disease

## 2015-11-27 VITALS — BP 136/78 | HR 84 | Ht 73.0 in | Wt 167.0 lb

## 2015-11-27 DIAGNOSIS — R05 Cough: Secondary | ICD-10-CM

## 2015-11-27 DIAGNOSIS — R059 Cough, unspecified: Secondary | ICD-10-CM

## 2015-11-27 DIAGNOSIS — J441 Chronic obstructive pulmonary disease with (acute) exacerbation: Secondary | ICD-10-CM

## 2015-11-27 LAB — CBC WITH DIFFERENTIAL/PLATELET
BASOS PCT: 0.4 % (ref 0.0–3.0)
Basophils Absolute: 0 10*3/uL (ref 0.0–0.1)
EOS PCT: 5.4 % — AB (ref 0.0–5.0)
Eosinophils Absolute: 0.4 10*3/uL (ref 0.0–0.7)
HEMATOCRIT: 40.4 % (ref 39.0–52.0)
HEMOGLOBIN: 13.4 g/dL (ref 13.0–17.0)
LYMPHS PCT: 18.8 % (ref 12.0–46.0)
Lymphs Abs: 1.3 10*3/uL (ref 0.7–4.0)
MCHC: 33.2 g/dL (ref 30.0–36.0)
MCV: 86.1 fl (ref 78.0–100.0)
MONO ABS: 0.9 10*3/uL (ref 0.1–1.0)
Monocytes Relative: 13.1 % — ABNORMAL HIGH (ref 3.0–12.0)
Neutro Abs: 4.5 10*3/uL (ref 1.4–7.7)
Neutrophils Relative %: 62.3 % (ref 43.0–77.0)
Platelets: 244 10*3/uL (ref 150.0–400.0)
RBC: 4.69 Mil/uL (ref 4.22–5.81)
RDW: 14.7 % (ref 11.5–15.5)
WBC: 7.2 10*3/uL (ref 4.0–10.5)

## 2015-11-27 MED ORDER — BENZONATATE 100 MG PO CAPS: 100.0000 mg | ORAL_CAPSULE | Freq: Three times a day (TID) | ORAL | Status: DC | PRN

## 2015-11-27 NOTE — Assessment & Plan Note (Signed)
This problem persists and seems to be worse today. His son notes that it's been fairly severe in the last several weeks. He does not often cough up mucus. He denies postnasal drip or indigestion. I explained to them that gastroesophageal reflux can be silent and that may be causing his cough. Also, with the mucus production he's been experiencing it's important to evaluate for an allergic process or an occult mold.  Plan: Check CBC with differential to check eosinophils Check serum IgE Try switching to a controller medicine with a steroid: We will give Breo instead of Spiriva He was encouraged to take antacid therapy for acid reflux every day for a month, Prilosec recommended Tessalon pearls when necessary Follow-up 6-8 weeks or sooner if needed

## 2015-11-27 NOTE — Progress Notes (Signed)
Subjective:    Patient ID: Jeffrey Blair, male    DOB: Jan 19, 1928, 82 y.o.   MRN: [id number]  Synopsis this is a very pleasant veteran who is seen for the first time in 2016 for COPD Simple spirometry testing was difficult for him to perform but the flow volume loop showed airflow obstruction so he was started on Spiriva in 2016.  HPI Chief Complaint  Patient presents with  . Follow-up    Pt c/o sob with exertion, chest tightness with exertion, prod cough with light gray mucus.     Jeffrey Blair has been breathing about the same.  He still has a cough which hasn't changed much since the last visit.  His family notes that the cough persists throughout.  Sometimes he produces mucus, but not every time.  He has never seen any color or blood in it.  He feels congested a lot.  He denies heartburn or indigestion.  He has some sinus drainage sometimes.   He has not been experiencing shortness of breath. He is generally weaker and he has had more problems with his balance recently.  Past Medical History  Diagnosis Date  . Cancer ([place])     prostate-in remission  . Dementia   . Parkinson disease ([place])   . Migraine       Review of Systems  Constitutional: Negative for fever, chills and fatigue.  HENT: Negative for postnasal drip, rhinorrhea and sinus pressure.   Respiratory: Positive for shortness of breath. Negative for cough and wheezing.   Cardiovascular: Negative for chest pain, palpitations and leg swelling.  Gastrointestinal: Positive for abdominal pain.       Objective:   Physical Exam Filed Vitals:   11/27/15 1336  BP: 136/78  Pulse: 84  Height: 6\' 1"  (1.854 m)  Weight: 167 lb (75.751 kg)  SpO2: 97%   RA  Gen: well appearing HENT: OP clear, TM's clear, neck supple PULM: CTA B, normal percussion CV: RRR, no mgr, trace edema GI: mild tenderness left upper quadrant, no mass, BS+, soft, nontender Derm: no cyanosis or rash Psyche: normal mood and affect  September  2016 chest x-ray images personally reviewed showing emphysema bilaterally (again today) Our office notes from February reviewed where he was treated for a COPD flare Lung function testing September 2016 flow volume loop consistent with airflow obstruction      Assessment & Plan:  COPD (chronic obstructive pulmonary disease) I think his COPD has been stable as he has not been having problems with shortness of breath and his lungs are clear today but he does continue to have a cough, see discussion above.  Plan: For now we will switch from Symbicort to [facility] in the form of a sample, they will call me and let me know if this seems to help.  Cough This problem persists and seems to be worse today. His son notes that it's been fairly severe in the last several weeks. He does not often cough up mucus. He denies postnasal drip or indigestion. I explained to them that gastroesophageal reflux can be silent and that may be causing his cough. Also, with the mucus production he's been experiencing it's important to evaluate for an allergic process or an occult mold.  Plan: Check CBC with differential to check eosinophils Check serum IgE Try switching to a controller medicine with a steroid: We will give Breo instead of Spiriva He was encouraged to take antacid therapy for acid reflux every day for a month, Prilosec  recommended Tessalon pearls when necessary Follow-up 6-8 weeks or sooner if needed     Current outpatient prescriptions:  .  carbidopa-levodopa (SINEMET IR) 25-100 MG per tablet, Take 2 tablets by mouth 3 (three) times daily., Disp: , Rfl:  .  carboxymethylcellulose (REFRESH PLUS) 0.5 % SOLN, Place 1 drop into both eyes 4 (four) times daily as needed., Disp: , Rfl:  .  diclofenac (CATAFLAM) 50 MG tablet, Take 50 mg by mouth as needed., Disp: , Rfl:  .  loratadine (CLARITIN) 10 MG tablet, Take 10 mg by mouth daily as needed for allergies., Disp: , Rfl:  .  meclizine (ANTIVERT) 25 MG  tablet, Take 25 mg by mouth 3 (three) times daily as needed for dizziness., Disp: , Rfl:  .  omeprazole (PRILOSEC) 20 MG capsule, Take 20 mg by mouth daily., Disp: , Rfl:  .  primidone (MYSOLINE) 250 MG tablet, Take 250 mg by mouth 2 (two) times daily., Disp: , Rfl:  .  ranitidine (ZANTAC) 300 MG capsule, Take 300 mg by mouth at bedtime as needed., Disp: , Rfl:  .  simvastatin (ZOCOR) 20 MG tablet, Take 20 mg by mouth every evening., Disp: , Rfl:  .  tiotropium (SPIRIVA) 18 MCG inhalation capsule, Place 1 capsule (18 mcg total) into inhaler and inhale daily., Disp: 30 capsule, Rfl: 12 .  topiramate (TOPAMAX) 25 MG capsule, Take 25 mg by mouth 2 (two) times daily., Disp: , Rfl:  .  traMADol (ULTRAM) 50 MG tablet, Take 50 mg by mouth 3 (three) times daily as needed., Disp: , Rfl:  .  Umeclidinium-Vilanterol (ANORO ELLIPTA) 62.5-25 MCG/INH AEPB, Inhale 1 puff into the lungs daily., Disp: 1 each, Rfl: 0 .  venlafaxine (EFFEXOR) 37.5 MG tablet, Take 37.5 mg by mouth 2 (two) times daily with a meal., Disp: , Rfl:  .  vitamin B-12 (CYANOCOBALAMIN) 500 MCG tablet, Take 500 mcg by mouth daily., Disp: , Rfl:

## 2015-11-27 NOTE — Assessment & Plan Note (Signed)
I think his COPD has been stable as he has not been having problems with shortness of breath and his lungs are clear today but he does continue to have a cough, see discussion above.  Plan: For now we will switch from Symbicort to [facility] in the form of a sample, they will call me and let me know if this seems to help.

## 2015-11-27 NOTE — Patient Instructions (Signed)
We will call you with the results of the bloodwork and the chest x-ray Stop Spiriva Take Breo 1 puff daily for the next week and then call me to let me know if this seems to be helping Take Tessalon Perles every 8 hours as needed for the cough Take Prilosec daily We will see you back in 6-8 weeks or sooner if needed

## 2016-01-11 ENCOUNTER — Encounter: Payer: Self-pay | Admitting: Pulmonary Disease

## 2016-01-11 ENCOUNTER — Ambulatory Visit (INDEPENDENT_AMBULATORY_CARE_PROVIDER_SITE_OTHER): Payer: PPO | Admitting: Pulmonary Disease

## 2016-01-11 VITALS — BP 130/72 | HR 90 | Ht 74.0 in | Wt 173.0 lb

## 2016-01-11 DIAGNOSIS — R05 Cough: Secondary | ICD-10-CM

## 2016-01-11 DIAGNOSIS — R059 Cough, unspecified: Secondary | ICD-10-CM

## 2016-01-11 DIAGNOSIS — J441 Chronic obstructive pulmonary disease with (acute) exacerbation: Secondary | ICD-10-CM

## 2016-01-11 MED ORDER — ALBUTEROL SULFATE HFA 108 (90 BASE) MCG/ACT IN AERS: 2.0000 | INHALATION_SPRAY | Freq: Four times a day (QID) | RESPIRATORY_TRACT | Status: AC | PRN

## 2016-01-11 MED ORDER — ALBUTEROL SULFATE (2.5 MG/3ML) 0.083% IN NEBU: 2.5000 mg | INHALATION_SOLUTION | Freq: Four times a day (QID) | RESPIRATORY_TRACT | Status: DC | PRN

## 2016-01-11 MED ORDER — FLUTICASONE FUROATE-VILANTEROL 100-25 MCG/INH IN AEPB: 1.0000 | INHALATION_SPRAY | Freq: Every day | RESPIRATORY_TRACT | Status: DC

## 2016-01-11 NOTE — Patient Instructions (Signed)
Take Breo 1 puff daily no matter how you feel Use albuterol (nebulized or HFA) as needed for chest tightness, shortness of breath, or wheezing. This can be used 3-4 times a day as needed We will see back in 6 months pursuing needed

## 2016-01-11 NOTE — Assessment & Plan Note (Addendum)
His cough improved significantly when we changed his controller medications to one that contained an inhaled corticosteroid.  He had a slightly elevated serum eosinophil count on the last visit. I suspect there is an atopic component to his airways disease, this seems to be well-controlled with an inhaled corticosteroid.  Plan: Continue Breo

## 2016-01-11 NOTE — Assessment & Plan Note (Signed)
His symptoms are better controlled on an inhaled corticosteroid.  Plan: Change prescription to Breo 1 puff daily Add albuterol nebulizer as needed, HFA inhaler as needed Follow-up 6 months

## 2016-01-11 NOTE — Progress Notes (Signed)
Subjective:    Patient ID: Jeffrey Blair, male    DOB: 02/08/1928, 82 y.o.   MRN: [id number]  Synopsis this is a very pleasant veteran who is seen for the first time in 2016 for COPD Simple spirometry testing was difficult for him to perform but the flow volume loop showed airflow obstruction so he was started on Spiriva in 2016.  HPI Chief Complaint  Patient presents with  . Follow-up    pt states breathing is baseline since last OV. c/o non prod cough. denies sob, wheezing or cp/tightness   [place] and his daughter provide the history together today. She says that the [facility] helps and the cough is better. Not having sinus symptoms or a runny nose.   Past Medical History  Diagnosis Date  . Cancer ([place])     prostate-in remission  . Dementia   . Parkinson disease ([place])   . Migraine       Review of Systems  Constitutional: Negative for fever, chills and fatigue.  HENT: Negative for postnasal drip, rhinorrhea and sinus pressure.   Respiratory: Positive for shortness of breath. Negative for cough and wheezing.   Cardiovascular: Negative for chest pain, palpitations and leg swelling.  Gastrointestinal: Positive for abdominal pain.       Objective:   Physical Exam Filed Vitals:   01/11/16 1351  BP: 130/72  Pulse: 90  Height: 6\' 2"  (1.88 m)  Weight: 173 lb (78.472 kg)  SpO2: 99%   RA  Gen: well appearing HENT: OP clear, neck supple PULM: CTA B, normal percussion CV: RRR, no mgr, trace edema GI: no mass, BS+, soft, nontender Derm: no cyanosis or rash Psyche: normal mood and affect  April eosinophil count 200 cell/microliter      Assessment & Plan:  Cough His cough improved significantly when we changed his controller medications to one that contained an inhaled corticosteroid.  He had a slightly elevated serum eosinophil count on the last visit. I suspect there is an atopic component to his airways disease, this seems to be well-controlled with an  inhaled corticosteroid.  Plan: Continue Breo  COPD (chronic obstructive pulmonary disease) His symptoms are better controlled on an inhaled corticosteroid.  Plan: Change prescription to Breo 1 puff daily Add albuterol nebulizer as needed, HFA inhaler as needed Follow-up 6 months     Current outpatient prescriptions:  .  benzonatate (TESSALON) 100 MG capsule, Take 1 capsule (100 mg total) by mouth 3 (three) times daily as needed for cough., Disp: 45 capsule, Rfl: 1 .  carbidopa-levodopa (SINEMET IR) 25-100 MG per tablet, Take 2 tablets by mouth 3 (three) times daily., Disp: , Rfl:  .  carboxymethylcellulose (REFRESH PLUS) 0.5 % SOLN, Place 1 drop into both eyes 4 (four) times daily as needed., Disp: , Rfl:  .  diclofenac (CATAFLAM) 50 MG tablet, Take 50 mg by mouth as needed., Disp: , Rfl:  .  loratadine (CLARITIN) 10 MG tablet, Take 10 mg by mouth daily as needed for allergies., Disp: , Rfl:  .  meclizine (ANTIVERT) 25 MG tablet, Take 25 mg by mouth 3 (three) times daily as needed for dizziness., Disp: , Rfl:  .  omeprazole (PRILOSEC) 20 MG capsule, Take 20 mg by mouth daily., Disp: , Rfl:  .  primidone (MYSOLINE) 250 MG tablet, Take 250 mg by mouth 2 (two) times daily., Disp: , Rfl:  .  ranitidine (ZANTAC) 300 MG capsule, Take 300 mg by mouth at bedtime as needed., Disp: , Rfl:  .  simvastatin (ZOCOR) 20 MG tablet, Take 20 mg by mouth every evening., Disp: , Rfl:  .  tiotropium (SPIRIVA) 18 MCG inhalation capsule, Place 1 capsule (18 mcg total) into inhaler and inhale daily., Disp: 30 capsule, Rfl: 12 .  topiramate (TOPAMAX) 25 MG capsule, Take 25 mg by mouth 2 (two) times daily., Disp: , Rfl:  .  traMADol (ULTRAM) 50 MG tablet, Take 50 mg by mouth 3 (three) times daily as needed., Disp: , Rfl:  .  Umeclidinium-Vilanterol (ANORO ELLIPTA) 62.5-25 MCG/INH AEPB, Inhale 1 puff into the lungs daily., Disp: 1 each, Rfl: 0 .  venlafaxine (EFFEXOR) 37.5 MG tablet, Take 37.5 mg by mouth 2  (two) times daily with a meal., Disp: , Rfl:  .  vitamin B-12 (CYANOCOBALAMIN) 500 MCG tablet, Take 500 mcg by mouth daily., Disp: , Rfl:

## 2016-01-14 DIAGNOSIS — J441 Chronic obstructive pulmonary disease with (acute) exacerbation: Secondary | ICD-10-CM | POA: Diagnosis not present

## 2016-01-14 DIAGNOSIS — M6281 Muscle weakness (generalized): Secondary | ICD-10-CM | POA: Diagnosis not present

## 2016-01-14 DIAGNOSIS — E876 Hypokalemia: Secondary | ICD-10-CM | POA: Diagnosis not present

## 2016-01-24 ENCOUNTER — Observation Stay (HOSPITAL_COMMUNITY)
Admission: EM | Admit: 2016-01-24 | Discharge: 2016-01-25 | Disposition: A | Discharge status: Home / Self Care | Payer: PPO | Attending: Internal Medicine | Admitting: Internal Medicine

## 2016-01-24 ENCOUNTER — Emergency Department (HOSPITAL_COMMUNITY): Payer: PPO

## 2016-01-24 ENCOUNTER — Encounter (HOSPITAL_COMMUNITY): Payer: Self-pay | Admitting: Emergency Medicine

## 2016-01-24 ENCOUNTER — Other Ambulatory Visit: Payer: Self-pay

## 2016-01-24 DIAGNOSIS — G20A1 Parkinson's disease without dyskinesia, without mention of fluctuations: Secondary | ICD-10-CM | POA: Diagnosis present

## 2016-01-24 DIAGNOSIS — G459 Transient cerebral ischemic attack, unspecified: Secondary | ICD-10-CM | POA: Diagnosis not present

## 2016-01-24 DIAGNOSIS — Z8546 Personal history of malignant neoplasm of prostate: Secondary | ICD-10-CM | POA: Insufficient documentation

## 2016-01-24 DIAGNOSIS — G20 Parkinson's disease: Secondary | ICD-10-CM | POA: Diagnosis not present

## 2016-01-24 DIAGNOSIS — R29818 Other symptoms and signs involving the nervous system: Secondary | ICD-10-CM | POA: Diagnosis not present

## 2016-01-24 DIAGNOSIS — M5090 Cervical disc disorder, unspecified, unspecified cervical region: Secondary | ICD-10-CM | POA: Diagnosis present

## 2016-01-24 DIAGNOSIS — I679 Cerebrovascular disease, unspecified: Secondary | ICD-10-CM | POA: Diagnosis present

## 2016-01-24 DIAGNOSIS — Z79899 Other long term (current) drug therapy: Secondary | ICD-10-CM | POA: Insufficient documentation

## 2016-01-24 DIAGNOSIS — R531 Weakness: Secondary | ICD-10-CM | POA: Diagnosis not present

## 2016-01-24 DIAGNOSIS — Z87891 Personal history of nicotine dependence: Secondary | ICD-10-CM | POA: Insufficient documentation

## 2016-01-24 DIAGNOSIS — E785 Hyperlipidemia, unspecified: Secondary | ICD-10-CM | POA: Diagnosis present

## 2016-01-24 DIAGNOSIS — R030 Elevated blood-pressure reading, without diagnosis of hypertension: Secondary | ICD-10-CM

## 2016-01-24 DIAGNOSIS — R5383 Other fatigue: Secondary | ICD-10-CM | POA: Diagnosis not present

## 2016-01-24 DIAGNOSIS — F4489 Other dissociative and conversion disorders: Secondary | ICD-10-CM | POA: Diagnosis not present

## 2016-01-24 DIAGNOSIS — R05 Cough: Secondary | ICD-10-CM

## 2016-01-24 DIAGNOSIS — R6881 Early satiety: Secondary | ICD-10-CM

## 2016-01-24 DIAGNOSIS — C801 Malignant (primary) neoplasm, unspecified: Secondary | ICD-10-CM

## 2016-01-24 DIAGNOSIS — IMO0001 Reserved for inherently not codable concepts without codable children: Secondary | ICD-10-CM | POA: Diagnosis present

## 2016-01-24 DIAGNOSIS — C61 Malignant neoplasm of prostate: Secondary | ICD-10-CM | POA: Diagnosis not present

## 2016-01-24 DIAGNOSIS — I6789 Other cerebrovascular disease: Secondary | ICD-10-CM | POA: Diagnosis not present

## 2016-01-24 DIAGNOSIS — R059 Cough, unspecified: Secondary | ICD-10-CM

## 2016-01-24 HISTORY — DX: Transient cerebral ischemic attack, unspecified: G45.9

## 2016-01-24 HISTORY — DX: Cervical disc disorder, unspecified, unspecified cervical region: M50.90

## 2016-01-24 HISTORY — DX: Cerebrovascular disease, unspecified: I67.9

## 2016-01-24 LAB — COMPREHENSIVE METABOLIC PANEL
ALT: 19 U/L (ref 17–63)
ANION GAP: 4 — AB (ref 5–15)
AST: 24 U/L (ref 15–41)
Albumin: 3.9 g/dL (ref 3.5–5.0)
Alkaline Phosphatase: 68 U/L (ref 38–126)
BUN: 15 mg/dL (ref 6–20)
CHLORIDE: 103 mmol/L (ref 101–111)
CO2: 30 mmol/L (ref 22–32)
CREATININE: 0.75 mg/dL (ref 0.61–1.24)
Calcium: 8.9 mg/dL (ref 8.9–10.3)
GFR calc non Af Amer: >60 mL/min (ref >60)
Glucose, Bld: 104 mg/dL — ABNORMAL HIGH (ref 65–99)
Potassium: 3.9 mmol/L (ref 3.5–5.1)
SODIUM: 137 mmol/L (ref 135–145)
Total Bilirubin: 0.6 mg/dL (ref 0.3–1.2)
Total Protein: 7.3 g/dL (ref 6.5–8.1)

## 2016-01-24 LAB — I-STAT CHEM 8, ED
BUN: 13 mg/dL (ref 6–20)
CALCIUM ION: 1.13 mmol/L (ref 1.13–1.30)
CHLORIDE: 100 mmol/L — AB (ref 101–111)
Creatinine, Ser: 0.8 mg/dL (ref 0.61–1.24)
Glucose, Bld: 100 mg/dL — ABNORMAL HIGH (ref 65–99)
HEMATOCRIT: 41 % (ref 39.0–52.0)
Hemoglobin: 13.9 g/dL (ref 13.0–17.0)
POTASSIUM: 3.9 mmol/L (ref 3.5–5.1)
SODIUM: 140 mmol/L (ref 135–145)
TCO2: 28 mmol/L (ref 0–100)

## 2016-01-24 LAB — DIFFERENTIAL
BASOS PCT: 0 %
Basophils Absolute: 0 10*3/uL (ref 0.0–0.1)
Eosinophils Absolute: 0.3 10*3/uL (ref 0.0–0.7)
Eosinophils Relative: 6 %
Lymphocytes Relative: 20 %
Lymphs Abs: 1.2 10*3/uL (ref 0.7–4.0)
MONO ABS: 0.8 10*3/uL (ref 0.1–1.0)
Monocytes Relative: 13 %
NEUTROS ABS: 3.7 10*3/uL (ref 1.7–7.7)
Neutrophils Relative %: 61 %

## 2016-01-24 LAB — URINALYSIS, ROUTINE W REFLEX MICROSCOPIC
BILIRUBIN URINE: NEGATIVE
GLUCOSE, UA: NEGATIVE mg/dL
HGB URINE DIPSTICK: NEGATIVE
KETONES UR: NEGATIVE mg/dL
Leukocytes, UA: NEGATIVE
Nitrite: NEGATIVE
PH: 8.5 — AB (ref 5.0–8.0)
Protein, ur: NEGATIVE mg/dL
SPECIFIC GRAVITY, URINE: 1.01 (ref 1.005–1.030)

## 2016-01-24 LAB — RAPID URINE DRUG SCREEN, HOSP PERFORMED
AMPHETAMINES: NOT DETECTED
BENZODIAZEPINES: NOT DETECTED
Barbiturates: POSITIVE — AB
COCAINE: NOT DETECTED
OPIATES: NOT DETECTED
Tetrahydrocannabinol: NOT DETECTED

## 2016-01-24 LAB — CBC
HCT: 39.3 % (ref 39.0–52.0)
Hemoglobin: 13 g/dL (ref 13.0–17.0)
MCH: 29 pg (ref 26.0–34.0)
MCHC: 33.1 g/dL (ref 30.0–36.0)
MCV: 87.7 fL (ref 78.0–100.0)
PLATELETS: 183 10*3/uL (ref 150–400)
RBC: 4.48 MIL/uL (ref 4.22–5.81)
RDW: 13.2 % (ref 11.5–15.5)
WBC: 6.1 10*3/uL (ref 4.0–10.5)

## 2016-01-24 LAB — ETHANOL

## 2016-01-24 LAB — I-STAT TROPONIN, ED: Troponin i, poc: 0 ng/mL (ref 0.00–0.08)

## 2016-01-24 LAB — PROTIME-INR
INR: 1 (ref 0.00–1.49)
PROTHROMBIN TIME: 13.4 s (ref 11.6–15.2)

## 2016-01-24 LAB — APTT: APTT: 32 s (ref 24–37)

## 2016-01-24 MED ORDER — TRAMADOL HCL 50 MG PO TABS
50.0000 mg | ORAL_TABLET | Freq: Three times a day (TID) | ORAL | Status: DC | PRN
  Administered 2016-01-24 – 2016-01-25 (×2): 50 mg via ORAL
  Filled 2016-01-24 (×2): qty 1

## 2016-01-24 MED ORDER — FLUTICASONE FUROATE-VILANTEROL 100-25 MCG/INH IN AEPB
1.0000 | INHALATION_SPRAY | Freq: Every day | RESPIRATORY_TRACT | Status: DC
  Administered 2016-01-25: 1 via RESPIRATORY_TRACT
  Filled 2016-01-24: qty 28

## 2016-01-24 MED ORDER — FAMOTIDINE 20 MG PO TABS
40.0000 mg | ORAL_TABLET | Freq: Every day | ORAL | Status: DC
  Administered 2016-01-24: 40 mg via ORAL
  Filled 2016-01-24: qty 2

## 2016-01-24 MED ORDER — ENOXAPARIN SODIUM 40 MG/0.4ML ~~LOC~~ SOLN
40.0000 mg | SUBCUTANEOUS | Status: DC
  Administered 2016-01-24: 40 mg via SUBCUTANEOUS
  Filled 2016-01-24: qty 0.4

## 2016-01-24 MED ORDER — LORATADINE 10 MG PO TABS: 10.0000 mg | ORAL_TABLET | Freq: Every day | ORAL | Status: DC | PRN

## 2016-01-24 MED ORDER — ASPIRIN 81 MG PO CHEW
324.0000 mg | CHEWABLE_TABLET | Freq: Once | ORAL | Status: AC
  Administered 2016-01-24: 324 mg via ORAL
  Filled 2016-01-24: qty 4

## 2016-01-24 MED ORDER — POLYVINYL ALCOHOL 1.4 % OP SOLN
1.0000 [drp] | OPHTHALMIC | Status: DC | PRN
Start: 2016-01-24 — End: 2016-01-25

## 2016-01-24 MED ORDER — MECLIZINE HCL 12.5 MG PO TABS
25.0000 mg | ORAL_TABLET | Freq: Three times a day (TID) | ORAL | Status: DC | PRN
  Administered 2016-01-24: 25 mg via ORAL
  Filled 2016-01-24: qty 2

## 2016-01-24 MED ORDER — SODIUM CHLORIDE 0.9 % IV BOLUS (SEPSIS)
500.0000 mL | Freq: Once | INTRAVENOUS | Status: AC
  Administered 2016-01-24: 500 mL via INTRAVENOUS

## 2016-01-24 MED ORDER — PRIMIDONE 50 MG PO TABS
150.0000 mg | ORAL_TABLET | Freq: Two times a day (BID) | ORAL | Status: DC
  Administered 2016-01-25: 150 mg via ORAL
  Filled 2016-01-24 (×6): qty 3

## 2016-01-24 MED ORDER — ALBUTEROL SULFATE (2.5 MG/3ML) 0.083% IN NEBU: 3.0000 mL | INHALATION_SOLUTION | Freq: Four times a day (QID) | RESPIRATORY_TRACT | Status: DC | PRN

## 2016-01-24 MED ORDER — CARBIDOPA-LEVODOPA 25-100 MG PO TABS
2.0000 | ORAL_TABLET | Freq: Three times a day (TID) | ORAL | Status: DC
  Administered 2016-01-24 – 2016-01-25 (×3): 2 via ORAL
  Filled 2016-01-24: qty 2
  Filled 2016-01-24: qty 1
  Filled 2016-01-24: qty 2
  Filled 2016-01-24: qty 1

## 2016-01-24 MED ORDER — TRAMADOL HCL 50 MG PO TABS
50.0000 mg | ORAL_TABLET | Freq: Once | ORAL | Status: AC
  Administered 2016-01-24: 50 mg via ORAL
  Filled 2016-01-24: qty 1

## 2016-01-24 MED ORDER — PANTOPRAZOLE SODIUM 40 MG PO TBEC
40.0000 mg | DELAYED_RELEASE_TABLET | Freq: Every day | ORAL | Status: DC
  Administered 2016-01-24 – 2016-01-25 (×2): 40 mg via ORAL
  Filled 2016-01-24 (×2): qty 1

## 2016-01-24 MED ORDER — SENNOSIDES-DOCUSATE SODIUM 8.6-50 MG PO TABS: 1.0000 | ORAL_TABLET | Freq: Every evening | ORAL | Status: DC | PRN

## 2016-01-24 MED ORDER — SIMVASTATIN 10 MG PO TABS
10.0000 mg | ORAL_TABLET | Freq: Every evening | ORAL | Status: DC
  Administered 2016-01-24: 10 mg via ORAL
  Filled 2016-01-24: qty 1

## 2016-01-24 MED ORDER — CARBOXYMETHYLCELLULOSE SODIUM 0.5 % OP SOLN: 1.0000 [drp] | Freq: Four times a day (QID) | OPHTHALMIC | Status: DC | PRN

## 2016-01-24 MED ORDER — KETOROLAC TROMETHAMINE 30 MG/ML IJ SOLN
30.0000 mg | Freq: Once | INTRAMUSCULAR | Status: AC
  Administered 2016-01-25: 30 mg via INTRAVENOUS
  Filled 2016-01-24: qty 1

## 2016-01-24 MED ORDER — STROKE: EARLY STAGES OF RECOVERY BOOK
Freq: Once | Status: DC
  Filled 2016-01-24: qty 1

## 2016-01-24 MED ORDER — BENZONATATE 100 MG PO CAPS
100.0000 mg | ORAL_CAPSULE | Freq: Three times a day (TID) | ORAL | Status: DC | PRN
Start: 2016-01-24 — End: 2016-01-25

## 2016-01-24 NOTE — Progress Notes (Signed)
1437 call from ER/ possible code stroke 1447 EMS brining patient to CT on EMS stretcher 1449 started CT scan 1451 Exam finished/ EMS taking patient back to ER with RN 1455 finished exam in EPIC and called GRA/ also sent to [place] results called to EDP by RAD and completed in canopy and EPIC.

## 2016-01-24 NOTE — ED Notes (Signed)
ED triage Start at [phone] entered in error.

## 2016-01-24 NOTE — ED Provider Notes (Signed)
CSN: [id number]     Arrival date & time 01/24/16  1442 History   First MD Initiated Contact with Patient 01/24/16 1444     Chief Complaint  Patient presents with  . Code Stroke    An emergency department physician performed an initial assessment on this suspected stroke patient at 38. (Consider location/radiation/quality/duration/timing/severity/associated sxs/prior Treatment) Patient is a 82 y.o. male presenting with weakness. The history is provided by a relative (The patient was in bed this morning he woke up and felt weak and eventually called his son who cannot see him and found that he had slurred speech and weakness in the left side. When paramedics arrived he had no weakness but slurred speech.).  Weakness This is a new problem. Episode onset: Unknown. The problem occurs constantly. The problem has been resolved. Pertinent negatives include no chest pain, no abdominal pain and no headaches. Nothing aggravates the symptoms. Nothing relieves the symptoms.    Past Medical History  Diagnosis Date  . Cancer ([place])     prostate-in remission  . Dementia   . Parkinson disease ([place])   . Migraine    History reviewed. No pertinent past surgical history. Family History  Problem Relation Age of Onset  . Allergies Child   . Cancer Father     lung cancer  . Cancer Brother     colon  . Cancer Sister    Social History  Substance Use Topics  . Smoking status: Former Smoker -- 0.50 packs/day for 20 years    Types: Cigarettes    Quit date: 04/07/1985  . Smokeless tobacco: Never Used  . Alcohol Use: No    Review of Systems  Constitutional: Negative for appetite change and fatigue.  HENT: Negative for congestion, ear discharge and sinus pressure.   Eyes: Negative for discharge.  Respiratory: Negative for cough.   Cardiovascular: Negative for chest pain.  Gastrointestinal: Negative for abdominal pain and diarrhea.  Genitourinary: Negative for frequency and hematuria.   Musculoskeletal: Negative for back pain.  Skin: Negative for rash.  Neurological: Positive for weakness. Negative for seizures and headaches.       Slurred speech  Psychiatric/Behavioral: Negative for hallucinations.      Allergies  Morphine and Penicillins  Home Medications   Prior to Admission medications   Medication Sig Start Date End Date Taking? Authorizing Provider  albuterol (PROVENTIL HFA;VENTOLIN HFA) 108 (90 Base) MCG/ACT inhaler Inhale 2 puffs into the lungs every 6 (six) hours as needed for wheezing or shortness of breath. 01/11/16  Yes Juanito Doom, MD  albuterol (PROVENTIL) (2.5 MG/3ML) 0.083% nebulizer solution Take 3 mLs (2.5 mg total) by nebulization every 6 (six) hours as needed for wheezing or shortness of breath. 01/11/16  Yes Juanito Doom, MD  benzonatate (TESSALON) 100 MG capsule Take 1 capsule (100 mg total) by mouth 3 (three) times daily as needed for cough. 11/27/15  Yes Juanito Doom, MD  Calcium Carbonate-Vitamin D (CALCARB 600/D) 600-400 MG-UNIT tablet Take 1 tablet by mouth 2 (two) times daily.   Yes Historical Provider, MD  carbidopa-levodopa (SINEMET IR) 25-100 MG per tablet Take 2 tablets by mouth 3 (three) times daily.   Yes Historical Provider, MD  carboxymethylcellulose (REFRESH PLUS) 0.5 % SOLN Place 1 drop into both eyes 4 (four) times daily as needed.   Yes Historical Provider, MD  fluticasone furoate-vilanterol (BREO ELLIPTA) 100-25 MCG/INH AEPB Inhale 1 puff into the lungs daily. 01/11/16  Yes Juanito Doom, MD  guaifenesin (HUMIBID E)  400 MG TABS tablet Take 400 mg by mouth 3 (three) times daily.   Yes Historical Provider, MD  loratadine (CLARITIN) 10 MG tablet Take 10 mg by mouth daily as needed for allergies.   Yes Historical Provider, MD  meclizine (ANTIVERT) 25 MG tablet Take 25 mg by mouth 3 (three) times daily as needed for dizziness.   Yes Historical Provider, MD  omeprazole (PRILOSEC) 20 MG capsule Take 20 mg by mouth daily.    Yes Historical Provider, MD  primidone (MYSOLINE) 50 MG tablet Take 150 mg by mouth 2 (two) times daily.   Yes Historical Provider, MD  ranitidine (ZANTAC) 300 MG capsule Take 300 mg by mouth at bedtime.    Yes Historical Provider, MD  simvastatin (ZOCOR) 20 MG tablet Take 10 mg by mouth every evening.    Yes Historical Provider, MD  traMADol (ULTRAM) 50 MG tablet Take 50 mg by mouth 3 (three) times daily as needed for moderate pain.    Yes Historical Provider, MD  vitamin B-12 (CYANOCOBALAMIN) 500 MCG tablet Take 1,000 mcg by mouth daily.    Yes Historical Provider, MD   BP 156/75 mmHg  Pulse 77  Temp(Src) 98.6 F (37 C)  Resp 15  Ht 6\' 2"  (1.88 m)  Wt 180 lb (81.647 kg)  BMI 23.10 kg/m2  SpO2 100% Physical Exam  Constitutional: He is oriented to person, place, and time. He appears well-developed.  HENT:  Head: Normocephalic.  Eyes: Conjunctivae and EOM are normal. No scleral icterus.  Neck: Neck supple. No thyromegaly present.  Cardiovascular: Normal rate and regular rhythm.  Exam reveals no gallop and no friction rub.   No murmur heard. Pulmonary/Chest: No stridor. He has no wheezes. He has no rales. He exhibits no tenderness.  Abdominal: He exhibits no distension. There is no tenderness. There is no rebound.  Musculoskeletal: Normal range of motion. He exhibits no edema.  Lymphadenopathy:    He has no cervical adenopathy.  Neurological: He is oriented to person, place, and time. He exhibits normal muscle tone. Coordination normal.  Patient's speech started returned to normal once he got to the emergency department  Skin: No rash noted. No erythema.  Psychiatric: He has a normal mood and affect. His behavior is normal.    ED Course  Procedures (including critical care time) Labs Review Labs Reviewed  COMPREHENSIVE METABOLIC PANEL - Abnormal; Notable for the following:    Glucose, Bld 104 (*)    Anion gap 4 (*)    All other components within normal limits  URINE RAPID  DRUG SCREEN, HOSP PERFORMED - Abnormal; Notable for the following:    Barbiturates POSITIVE (*)    All other components within normal limits  URINALYSIS, ROUTINE W REFLEX MICROSCOPIC (NOT AT [facility]) - Abnormal; Notable for the following:    pH 8.5 (*)    All other components within normal limits  I-STAT CHEM 8, ED - Abnormal; Notable for the following:    Chloride 100 (*)    Glucose, Bld 100 (*)    All other components within normal limits  ETHANOL  PROTIME-INR  APTT  CBC  DIFFERENTIAL  I-STAT TROPOININ, ED    Imaging Review Dg Chest Port 1 View  01/24/2016  CLINICAL DATA:  Weakness EXAM: PORTABLE CHEST 1 VIEW COMPARISON:  11/27/2015 chest radiograph. FINDINGS: Stable cardiomediastinal silhouette with normal heart size. No pneumothorax. No pleural effusion. No pulmonary edema. Stable biapical pleural-parenchymal scarring. No acute consolidative airspace disease. IMPRESSION: No active disease. Electronically Signed  By: Ilona Sorrel M.D.   On: 01/24/2016 15:48   Ct Head Code Stroke W/o Cm  01/24/2016  CLINICAL DATA:  Left-sided weakness. Code stroke. Prostate cancer in remission. Parkinson disease. EXAM: CT HEAD WITHOUT CONTRAST TECHNIQUE: Contiguous axial images were obtained from the base of the skull through the vertex without intravenous contrast. COMPARISON:  12/20/2014 head CT. FINDINGS: No evidence of parenchymal hemorrhage or extra-axial fluid collection. No mass lesion, mass effect, or midline shift. No CT evidence of acute infarction. Intracranial atherosclerosis. Nonspecific mild subcortical and periventricular white matter hypodensity, most in keeping with chronic small vessel ischemic change. Cerebral volume is age appropriate. No ventriculomegaly. Mucoperiosteal thickening in the visualized bilateral maxillary sinuses. No fluid levels in the visualized paranasal sinuses. The mastoid air cells are unopacified. No evidence of calvarial fracture. IMPRESSION: 1.  No evidence of acute  intracranial abnormality. 2. Mild chronic small vessel ischemia . 3. Mild paranasal sinusitis, probably chronic. These results were called by telephone at the time of interpretation on 01/24/2016 at 3:05 pm to Dr. Milton Ferguson , who verbally acknowledged these results. Electronically Signed   By: Ilona Sorrel M.D.   On: 01/24/2016 15:04   I have personally reviewed and evaluated these images and lab results as part of my medical decision-making.   EKG Interpretation   Date/Time:  Sunday January 24 2016 14:59:00 EDT Ventricular Rate:  84 PR Interval:    QRS Duration: 83 QT Interval:  398 QTC Calculation: 471 R Axis:   84 Text Interpretation:  Sinus rhythm Borderline right axis deviation  Confirmed by Liticia Gasior  MD, Aniketh Huberty ([id number]) on 01/24/2016 6:32:43 PM      MDM   Final diagnoses:  Transient cerebral ischemia, unspecified transient cerebral ischemia type      CT head unremarkable,   suspect TIA. Patient will be admitted for TIA workup  Milton Ferguson, MD 01/24/16 1840

## 2016-01-24 NOTE — ED Notes (Signed)
Neuro checks to be done every 2 hours per Dr. Roderic Palau.

## 2016-01-24 NOTE — H&P (Signed)
History and Physical  [name] [id number] DOB: [birth year] DOA: 01/24/2016  Referring physician: Dr Dewayne Hatch, ED physician PCP: Octavio Graves, DO  Outpatient Specialists:   [facility]  Pt has neurologist, but not able to remember name  Chief Complaint: Left arm weakness  HPI: Jeffrey Blair is a 81 y.o. male with a history of prostate cancer in remission, Parkinson disease, migraines.  He was last seen well last night. He woke up this morning around 1 PM feeling weak. He called his son, who noted that he had some slurred speech. Multiple family members came to check on him and called EMS. When EMS arrived, he found that he had resolved the lower extremity weakness, but continued to have slurred speech.  He said resolved upon arrival to the emergency department around 3 PM. The patient's bedroom was quite hot when EMS arrived. At the time of my conversation with the patient, he felt fine and had no deficits. He isn't had no other episode similar to this. His family reports that this episode was not normal for him. No palliating or provoking factors.   Review of Systems:   Pt denies any fevers, chills, nausea, vomiting, diarrhea, constipation, abdominal pain, shortness of breath, dyspnea on exertion, orthopnea, cough, wheezing, palpitations, headache, vision changes, lightheadedness, dizziness, melena, rectal bleeding.  Review of systems are otherwise negative  Past Medical History  Diagnosis Date  . Cancer ([place])     prostate-in remission  . Dementia   . Parkinson disease ([place])   . Migraine    History reviewed. No pertinent past surgical history. Social History:  reports that he quit smoking about 30 years ago. His smoking use included Cigarettes. He has a 10 pack-year smoking history. He has never used smokeless tobacco. He reports that he does not drink alcohol or use illicit drugs. Patient lives at [facility]  . Morphine Anaphylaxis,  Shortness Of Breath and Other (See Comments)    Altered mental status  . Penicillins Other (See Comments)    Has patient had a PCN reaction causing immediate rash, facial/tongue/throat swelling, SOB or lightheadedness with hypotension: unknown Has patient had a PCN reaction causing severe rash involving mucus membranes or skin necrosis: unknown Has patient had a PCN reaction that required hospitalization; unknown Has patient had a PCN reaction occurring within the last 10 years:unknown If all of the above answers are "NO", then may proceed with Cephalosporin use.  Unknown reaction    Family History  Problem Relation Age of Onset  . Allergies Child   . Cancer Father     lung cancer  . Cancer Brother     colon  . Cancer Sister       Prior to Admission medications   Medication Sig Start Date End Date Taking? Authorizing Provider  albuterol (PROVENTIL HFA;VENTOLIN HFA) 108 (90 Base) MCG/ACT inhaler Inhale 2 puffs into the lungs every 6 (six) hours as needed for wheezing or shortness of breath. 01/11/16  Yes Juanito Doom, MD  albuterol (PROVENTIL) (2.5 MG/3ML) 0.083% nebulizer solution Take 3 mLs (2.5 mg total) by nebulization every 6 (six) hours as needed for wheezing or shortness of breath. 01/11/16  Yes Juanito Doom, MD  benzonatate (TESSALON) 100 MG capsule Take 1 capsule (100 mg total) by mouth 3 (three) times daily as needed for cough. 11/27/15  Yes Juanito Doom, MD  Calcium Carbonate-Vitamin D (CALCARB 600/D) 600-400 MG-UNIT tablet Take 1 tablet by mouth 2 (two) times  daily.   Yes Historical Provider, MD  carbidopa-levodopa (SINEMET IR) 25-100 MG per tablet Take 2 tablets by mouth 3 (three) times daily.   Yes Historical Provider, MD  carboxymethylcellulose (REFRESH PLUS) 0.5 % SOLN Place 1 drop into both eyes 4 (four) times daily as needed.   Yes Historical Provider, MD  fluticasone furoate-vilanterol (BREO ELLIPTA) 100-25 MCG/INH AEPB Inhale 1 puff into the lungs daily.  01/11/16  Yes Juanito Doom, MD  guaifenesin (HUMIBID E) 400 MG TABS tablet Take 400 mg by mouth 3 (three) times daily.   Yes Historical Provider, MD  loratadine (CLARITIN) 10 MG tablet Take 10 mg by mouth daily as needed for allergies.   Yes Historical Provider, MD  meclizine (ANTIVERT) 25 MG tablet Take 25 mg by mouth 3 (three) times daily as needed for dizziness.   Yes Historical Provider, MD  omeprazole (PRILOSEC) 20 MG capsule Take 20 mg by mouth daily.   Yes Historical Provider, MD  primidone (MYSOLINE) 50 MG tablet Take 150 mg by mouth 2 (two) times daily.   Yes Historical Provider, MD  ranitidine (ZANTAC) 300 MG capsule Take 300 mg by mouth at bedtime.    Yes Historical Provider, MD  simvastatin (ZOCOR) 20 MG tablet Take 10 mg by mouth every evening.    Yes Historical Provider, MD  traMADol (ULTRAM) 50 MG tablet Take 50 mg by mouth 3 (three) times daily as needed for moderate pain.    Yes Historical Provider, MD  vitamin B-12 (CYANOCOBALAMIN) 500 MCG tablet Take 1,000 mcg by mouth daily.    Yes Historical Provider, MD    Physical Exam: BP 156/75 mmHg  Pulse 77  Temp(Src) 98.6 F (37 C)  Resp 15  Ht 6\' 2"  (1.88 m)  Wt 81.647 kg (180 lb)  BMI 23.10 kg/m2  SpO2 100%  General: Elderly black male. Awake and alert and oriented x3. No acute cardiopulmonary distress.  HEENT: Normocephalic atraumatic.  Right and left ears normal in appearance.  Pupils equal, round, reactive to light. Extraocular muscles are intact. Sclerae anicteric and noninjected.  Moist mucosal membranes. No mucosal lesions.  Neck: Neck supple without lymphadenopathy. No carotid bruits. No masses palpated.  Cardiovascular: Regular rate with normal S1-S2 sounds. No murmurs, rubs, gallops auscultated. No JVD.  Respiratory: Good respiratory effort with no wheezes, rales, rhonchi. Lungs clear to auscultation bilaterally.  No accessory muscle use. Abdomen: Soft, nontender, nondistended. Active bowel sounds. No masses or  hepatosplenomegaly  Skin: No rashes, lesions, or ulcerations.  Dry, warm to touch. 2+ dorsalis pedis and radial pulses. Musculoskeletal: No calf or leg pain. All major joints not erythematous nontender.  No upper or lower joint deformation.  Good ROM.  No contractures  Psychiatric: Intact judgment and insight. Pleasant and cooperative. Neurologic: No focal neurological deficits. Strength is 5/5 and symmetric in upper and lower extremities.  Sensation intact. Coordination intact. Cranial nerves II through XII are grossly intact.           Labs on Admission: I have personally reviewed following labs and imaging studies  CBC:  Recent Labs Lab 01/24/16 1507 01/24/16 1528  WBC 6.1  --   NEUTROABS 3.7  --   HGB 13.0 13.9  HCT 39.3 41.0  MCV 87.7  --   PLT 183  --    Basic Metabolic Panel:  Recent Labs Lab 01/24/16 1507 01/24/16 1528  NA 137 140  K 3.9 3.9  CL 103 100*  CO2 30  --   GLUCOSE 104* 100*  BUN 15 13  CREATININE 0.75 0.80  CALCIUM 8.9  --    GFR: Estimated Creatinine Clearance: 77.9 mL/min (by C-G formula based on Cr of 0.8). Liver Function Tests:  Recent Labs Lab 01/24/16 1507  AST 24  ALT 19  ALKPHOS 68  BILITOT 0.6  PROT 7.3  ALBUMIN 3.9   No results for input(s): LIPASE, AMYLASE in the last 168 hours. No results for input(s): AMMONIA in the last 168 hours. Coagulation Profile:  Recent Labs Lab 01/24/16 1507  INR 1.00   Cardiac Enzymes: No results for input(s): CKTOTAL, CKMB, CKMBINDEX, TROPONINI in the last 168 hours. BNP (last 3 results) No results for input(s): PROBNP in the last 8760 hours. HbA1C: No results for input(s): HGBA1C in the last 72 hours. CBG: No results for input(s): GLUCAP in the last 168 hours. Lipid Profile: No results for input(s): CHOL, HDL, LDLCALC, TRIG, CHOLHDL, LDLDIRECT in the last 72 hours. Thyroid Function Tests: No results for input(s): TSH, T4TOTAL, FREET4, T3FREE, THYROIDAB in the last 72 hours. Anemia  Panel: No results for input(s): VITAMINB12, FOLATE, FERRITIN, TIBC, IRON, RETICCTPCT in the last 72 hours. Urine analysis:    Component Value Date/Time   COLORURINE YELLOW 01/24/2016 [place] 01/24/2016 1520   LABSPEC 1.010 01/24/2016 1520   PHURINE 8.5* 01/24/2016 1520   GLUCOSEU NEGATIVE 01/24/2016 1520   GLUCOSEU NEGATIVE 05/21/2015 1632   HGBUR NEGATIVE 01/24/2016 1520   BILIRUBINUR NEGATIVE 01/24/2016 1520   KETONESUR NEGATIVE 01/24/2016 1520   PROTEINUR NEGATIVE 01/24/2016 1520   UROBILINOGEN 0.2 05/21/2015 1632   NITRITE NEGATIVE 01/24/2016 1520   LEUKOCYTESUR NEGATIVE 01/24/2016 1520   Sepsis Labs: @LABRCNTIP (procalcitonin:4,lacticidven:4) )No results found for this or any previous visit (from the past 240 hour(s)).   Radiological Exams on Admission: Dg Chest Port 1 View  01/24/2016  CLINICAL DATA:  Weakness EXAM: PORTABLE CHEST 1 VIEW COMPARISON:  11/27/2015 chest radiograph. FINDINGS: Stable cardiomediastinal silhouette with normal heart size. No pneumothorax. No pleural effusion. No pulmonary edema. Stable biapical pleural-parenchymal scarring. No acute consolidative airspace disease. IMPRESSION: No active disease. Electronically Signed   By: Ilona Sorrel M.D.   On: 01/24/2016 15:48   Ct Head Code Stroke W/o Cm  01/24/2016  CLINICAL DATA:  Left-sided weakness. Code stroke. Prostate cancer in remission. Parkinson disease. EXAM: CT HEAD WITHOUT CONTRAST TECHNIQUE: Contiguous axial images were obtained from the base of the skull through the vertex without intravenous contrast. COMPARISON:  12/20/2014 head CT. FINDINGS: No evidence of parenchymal hemorrhage or extra-axial fluid collection. No mass lesion, mass effect, or midline shift. No CT evidence of acute infarction. Intracranial atherosclerosis. Nonspecific mild subcortical and periventricular white matter hypodensity, most in keeping with chronic small vessel ischemic change. Cerebral volume is age  appropriate. No ventriculomegaly. Mucoperiosteal thickening in the visualized bilateral maxillary sinuses. No fluid levels in the visualized paranasal sinuses. The mastoid air cells are unopacified. No evidence of calvarial fracture. IMPRESSION: 1.  No evidence of acute intracranial abnormality. 2. Mild chronic small vessel ischemia . 3. Mild paranasal sinusitis, probably chronic. These results were called by telephone at the time of interpretation on 01/24/2016 at 3:05 pm to Dr. Milton Ferguson , who verbally acknowledged these results. Electronically Signed   By: Ilona Sorrel M.D.   On: 01/24/2016 15:04    EKG: Independently reviewed. Sinus rhythm with borderline right axis deviation. No acute ST elevation or depression.  Assessment/Plan: Principal Problem:   TIA (transient ischemic attack) Active Problems:   Parkinson disease ([place])  This patient was discussed with the ED physician, including pertinent vitals, physical exam findings, labs, and imaging.  We also discussed care given by the ED provider.  #1 TIA  Observation on telemetry  MRI, echocardiogram, duplex in the morning  Daily aspirin  PT, OT, speech consult #2 Parkinson disease - continue home medications #3 migraines - continue home medications #4 COPD - stable  DVT prophylaxis: Lovenox Consultants: None Code Status: Full code Family Communication: Daughter, son in the room  Disposition Plan: Observation with likely discharge tomorrow   Truett Mainland, DO [facility] Pager [phone]  If 7PM-7AM, please contact night-coverage [email or web address] Password [email or web address]

## 2016-01-24 NOTE — ED Notes (Signed)
Per EMS called to patient's home due to stroke like symptoms. EMS states they arrive at patient's house. EMS states patient's room was in the upper 90's in temp. Patient's family stated left sided deficits. EMS states no deficits on arrival. EMS states some slurred speech speech which cleared up on arrival to [facility] ED.

## 2016-01-25 ENCOUNTER — Observation Stay (HOSPITAL_COMMUNITY): Payer: PPO

## 2016-01-25 ENCOUNTER — Observation Stay (HOSPITAL_BASED_OUTPATIENT_CLINIC_OR_DEPARTMENT_OTHER): Payer: PPO

## 2016-01-25 ENCOUNTER — Encounter (HOSPITAL_COMMUNITY): Payer: Self-pay | Admitting: Internal Medicine

## 2016-01-25 DIAGNOSIS — E785 Hyperlipidemia, unspecified: Secondary | ICD-10-CM | POA: Diagnosis present

## 2016-01-25 DIAGNOSIS — C61 Malignant neoplasm of prostate: Secondary | ICD-10-CM | POA: Diagnosis not present

## 2016-01-25 DIAGNOSIS — I679 Cerebrovascular disease, unspecified: Secondary | ICD-10-CM | POA: Diagnosis not present

## 2016-01-25 DIAGNOSIS — R410 Disorientation, unspecified: Secondary | ICD-10-CM | POA: Diagnosis not present

## 2016-01-25 DIAGNOSIS — G459 Transient cerebral ischemic attack, unspecified: Secondary | ICD-10-CM | POA: Diagnosis not present

## 2016-01-25 DIAGNOSIS — R51 Headache: Secondary | ICD-10-CM | POA: Diagnosis not present

## 2016-01-25 DIAGNOSIS — I6523 Occlusion and stenosis of bilateral carotid arteries: Secondary | ICD-10-CM | POA: Diagnosis not present

## 2016-01-25 DIAGNOSIS — M5090 Cervical disc disorder, unspecified, unspecified cervical region: Secondary | ICD-10-CM

## 2016-01-25 DIAGNOSIS — IMO0001 Reserved for inherently not codable concepts without codable children: Secondary | ICD-10-CM | POA: Diagnosis present

## 2016-01-25 DIAGNOSIS — R030 Elevated blood-pressure reading, without diagnosis of hypertension: Secondary | ICD-10-CM

## 2016-01-25 DIAGNOSIS — I6602 Occlusion and stenosis of left middle cerebral artery: Secondary | ICD-10-CM | POA: Diagnosis not present

## 2016-01-25 HISTORY — DX: Cervical disc disorder, unspecified, unspecified cervical region: M50.90

## 2016-01-25 HISTORY — DX: Cerebrovascular disease, unspecified: I67.9

## 2016-01-25 LAB — ECHOCARDIOGRAM COMPLETE
CHL CUP MV DEC (S): 275
CHL CUP STROKE VOLUME: 31 mL
CHL CUP TV REG PEAK VELOCITY: 253 cm/s
E decel time: 275 msec
EERAT: 11.39
FS: 38 % (ref 28–44)
Height: 74 in
IVS/LV PW RATIO, ED: 1.19
LA ID, A-P, ES: 30 mm
LA diam index: 1.49 cm/m2
LA vol A4C: 32.1 ml
LAVOL: 38.1 mL
LAVOLIN: 18.9 mL/m2
LDCA: 3.8 cm2
LEFT ATRIUM END SYS DIAM: 30 mm
LV E/e' medial: 11.39
LV E/e'average: 11.39
LV dias vol: 50 mL — AB (ref 62–150)
LV e' LATERAL: 7.07 cm/s
LVDIAVOLIN: 25 mL/m2
LVOTD: 22 mm
LVSYSVOL: 19 mL — AB (ref 21–61)
LVSYSVOLIN: 10 mL/m2
MV pk A vel: 100 m/s
MV pk E vel: 80.5 m/s
MVPG: 3 mmHg
PW: 10.3 mm — AB (ref 0.6–1.1)
RV TAPSE: 17.3 mm
Simpson's disk: 61
TDI e' lateral: 7.07
TDI e' medial: 6.96
TR max vel: 253 cm/s
Weight: 2689.6 oz

## 2016-01-25 LAB — LIPID PANEL
CHOL/HDL RATIO: 3.3 ratio
CHOLESTEROL: 166 mg/dL (ref 0–200)
HDL: 51 mg/dL (ref >40)
LDL CALC: 104 mg/dL — AB (ref 0–99)
Triglycerides: 54 mg/dL (ref <150)
VLDL: 11 mg/dL (ref 0–40)

## 2016-01-25 MED ORDER — ASPIRIN 81 MG PO CHEW
81.0000 mg | CHEWABLE_TABLET | Freq: Every day | ORAL | Status: DC
  Administered 2016-01-25: 81 mg via ORAL
  Filled 2016-01-25: qty 1

## 2016-01-25 MED ORDER — SIMVASTATIN 20 MG PO TABS: 20.0000 mg | ORAL_TABLET | Freq: Every evening | ORAL | Status: DC

## 2016-01-25 MED ORDER — ASPIRIN 81 MG PO CHEW: 81.0000 mg | CHEWABLE_TABLET | Freq: Every day | ORAL | Status: DC

## 2016-01-25 NOTE — Care Management Note (Signed)
Case Management Note  Patient Details  Name: Jeffrey Blair MRN: [id number] Date of Birth: 1927/11/27  Subjective/Objective: Patient is from home, daughter lives with him. Patient has lots of family support. He has 8 children who share caring for him. Son is at bedside. Son drives him to appointments. He walks with a cane and has a RW at home. Has had HH in the past through [facility] PT is recommending HHPT at discharge. Patient and soon would like to use [facility].  Son reports no issues obtaining medications.         Action/Plan: Romualdo Bolk of [facility] notified and will obtain information from the chart. Patient and son aware that [facility] has 48 hours to make first visit.    Expected Discharge Date:  01/25/16               Expected Discharge Plan:  [place]  In-House Referral:  NA  Discharge planning Services  CM Consult  Post Acute Care Choice:  Home Health Choice offered to:  Patient, Adult Children  DME Arranged:    DME Agency:     HH Arranged:  RN, PT [place] Agency:  [place]  Status of Service:  Completed, signed off  If discussed at [place] of Stay Meetings, dates discussed:    Additional Comments:  Ajanay Farve, Chauncey [place], RN 01/25/2016, 1:38 PM

## 2016-01-25 NOTE — Evaluation (Signed)
Physical Therapy Evaluation Patient Details Name: Jeffrey Blair MRN: [id number] DOB: 06-30-1928 Today's Date: 01/25/2016   History of Present Illness  82 yo M admitted with slurred speech, and weakness. EMS reported that pt's bedroom temperature was in the upper 90's. PMH: Prostate CA in remission, Parkinson disease, dementia, COPD.  Clinical Impression  Pt received in bed, dtr present, and son arrived during evaluation.  Pt normally is ambulatory with RW, and is independent with dressing and bathing.  During today's evaluation he demonstrates a strong startle reflex upon positional changes, which son states is baseline for him.  Orthostatic BP's taken due to pt's c/o feeling dizzy, however they were unremarkable.  Pt ambulated 61ft with RW and Min A +2 for safety.   Pt is recommended for HHPT and 24/7 supervision/assistance at home.      Follow Up Recommendations Home health PT;Supervision/Assistance - 24 hour (Son states that they are alreayd providing 24/7 supervision /assistance. )    Equipment Recommendations  None recommended by PT    Recommendations for Other Services       Precautions / Restrictions Precautions Precautions: Fall Precaution Comments: Due to weakness Restrictions Weight Bearing Restrictions: No      Mobility  Bed Mobility Overal bed mobility: Needs Assistance Bed Mobility: Supine to Sit     Supine to sit: Min assist;HOB elevated     General bed mobility comments: Once seated on the EOB, he demonstrates a strong startle reflex.    Transfers Overall transfer level: Needs assistance Equipment used: Rolling walker (2 wheeled) Transfers: Sit to/from Stand Sit to Stand: Min assist;+2 safety/equipment         General transfer comment: Son present, and provides assistances for transfer.    Ambulation/Gait Ambulation/Gait assistance: Min assist;+2 safety/equipment Ambulation Distance (Feet): 50 Feet Assistive device: Rolling walker (2  wheeled) Gait Pattern/deviations: Step-through pattern;Decreased step length - right;Decreased step length - left     General Gait Details: Son encouraged pt to "walk normally" and "put one foot in front of the other."   Stairs            Wheelchair Mobility    Modified Rankin (Stroke Patients Only)       Balance Overall balance assessment: Needs assistance Sitting-balance support: Bilateral upper extremity supported Sitting balance-Leahy Scale: Fair Sitting balance - Comments: Posterior lean with increased startle reflex.    Standing balance support: Bilateral upper extremity supported Standing balance-Leahy Scale: Fair                               Pertinent Vitals/Pain Pain Assessment: 0-10 Pain Score: 8  Pain Location: HA - migraines "comes out of each side"  Pain Intervention(s): Limited activity within patient's tolerance    Home Living   Living Arrangements: Children (dtr - works part time. ) Available Help at Discharge: Available 24 hours/day;Family (His 8 children rotate who is going to assist while the main caregiver is at work. ) Type of Home: House Home Access: Ramped entrance     Home Layout: Two level Home Equipment: Environmental consultant - 2 wheels;Cane - single point;Bedside commode;Shower seat      Prior Function Level of Independence: Independent   Gait / Transfers Assistance Needed: Son states that they have been making him use the walker all the time.  Son states that there is someone there with him everytime he is up and moving around, and they provide CGA.   ADL's /  Homemaking Assistance Needed: Pt states he is indepedent with ADL's.  family helps with driving.         Hand Dominance   Dominant Hand: Right    Extremity/Trunk Assessment   Upper Extremity Assessment: LUE deficits/detail       LUE Deficits / Details: Grossly 3+/5 - but pt states that it is weaker.    Lower Extremity Assessment: Generalized weakness          Communication   Communication: No difficulties  Cognition Arousal/Alertness: Awake/alert Behavior During Therapy: WFL for tasks assessed/performed Overall Cognitive Status: Impaired/Different from baseline Area of Impairment: Orientation Orientation Level: Disoriented to;Situation;Time                  General Comments      Exercises        Assessment/Plan    PT Assessment Patient needs continued PT services  PT Diagnosis Difficulty walking;Abnormality of gait;Generalized weakness   PT Problem List Decreased strength;Decreased range of motion;Decreased activity tolerance;Decreased balance;Decreased mobility;Decreased coordination;Decreased cognition;Decreased knowledge of use of DME;Decreased safety awareness;Decreased knowledge of precautions  PT Treatment Interventions DME instruction;Gait training;Stair training;Functional mobility training;Therapeutic activities;Therapeutic exercise;Balance training;Cognitive remediation;Patient/family education;Neuromuscular re-education   PT Goals (Current goals can be found in the Care Plan section) Acute Rehab PT Goals Patient Stated Goal: Pt wants to get strong enough to get home.  PT Goal Formulation: With patient/family Time For Goal Achievement: 02/01/16 Potential to Achieve Goals: Fair    Frequency Min 3X/week   Barriers to discharge        Co-evaluation               End of Session Equipment Utilized During Treatment: Gait belt Activity Tolerance: Patient limited by fatigue;Patient tolerated treatment well Patient left: in bed;with bed alarm set;with family/visitor present Nurse Communication: Mobility status    Functional Assessment Tool Used: [employer] "6-clicks"  Functional Limitation: Mobility: Walking and moving around Mobility: Walking and Moving Around Current Status [phone]): At least 40 percent but less than 60 percent impaired, limited or restricted Mobility: Walking and Moving Around  Goal Status [phone]): At least 20 percent but less than 40 percent impaired, limited or restricted    Time: 1259-1335 PT Time Calculation (min) (ACUTE ONLY): 36 min   Charges:   PT Evaluation $PT Eval Moderate Complexity: 1 Procedure PT Treatments $Gait Training: 8-22 mins   PT G Codes:   PT G-Codes **NOT FOR INPATIENT CLASS** Functional Assessment Tool Used: [employer] "6-clicks"  Functional Limitation: Mobility: Walking and moving around Mobility: Walking and Moving Around Current Status [phone]): At least 40 percent but less than 60 percent impaired, limited or restricted Mobility: Walking and Moving Around Goal Status [phone]): At least 20 percent but less than 40 percent impaired, limited or restricted    Beth Leeza Heiner, PT, DPT X: [phone]

## 2016-01-25 NOTE — Progress Notes (Signed)
OT Cancellation Note  Patient Details Name: Jeffrey Blair MRN: [id number] DOB: May 20, 1928   Cancelled Treatment:    Reason Eval/Treat Not Completed: Pain limiting ability to participate. Pt just returning from test upon therapy arrival. Son present. Patient was just getting set up for breakfast and had complaints of a severe headache. Pain meds requested from nursing. Patient was unable to participate in OT evaluation at this time. It appears that all stroke like symptoms have resolved although therapist was unable to assess formally. Son reports that mornings are always harder for patient. Will re-attempt evaluation when able at a later time.   Ailene Ravel, OTR/L,CBIS  [phone]  01/25/2016, 8:43 AM

## 2016-01-25 NOTE — Care Management Note (Signed)
Case Management Note  Patient Details  Name: ROEMELLO LANGBEHN MRN: [id number] Date of Birth: [birth year]    Expected Discharge Date:  01/25/16               Expected Discharge Plan:     In-House Referral:     Discharge planning Services     Post Acute Care Choice:    Choice offered to:     DME Arranged:    DME Agency:     HH Arranged:    HH Agency:     Status of Service:     If discussed at [employer] of Stay Meetings, dates discussed:    Additional Comments: Notified [facility], that patient is here for TIA under observation.  Anjalina Bergevin, Chauncey Reading, RN 01/25/2016, 1:15 PM

## 2016-01-25 NOTE — Progress Notes (Signed)
Jeffrey Blair discharged Home per MD order.  Discharge instructions reviewed and discussed with the patient, all questions and concerns answered. Copy of instructions and scripts given to patient.    Medication List    TAKE these medications        albuterol (2.5 MG/3ML) 0.083% nebulizer solution  Commonly known as:  PROVENTIL  Take 3 mLs (2.5 mg total) by nebulization every 6 (six) hours as needed for wheezing or shortness of breath.     albuterol 108 (90 Base) MCG/ACT inhaler  Commonly known as:  PROVENTIL HFA;VENTOLIN HFA  Inhale 2 puffs into the lungs every 6 (six) hours as needed for wheezing or shortness of breath.     aspirin 81 MG chewable tablet  Chew 1 tablet (81 mg total) by mouth daily.     benzonatate 100 MG capsule  Commonly known as:  TESSALON  Take 1 capsule (100 mg total) by mouth 3 (three) times daily as needed for cough.     CALCARB 600/D 600-400 MG-UNIT tablet  Generic drug:  Calcium Carbonate-Vitamin D  Take 1 tablet by mouth 2 (two) times daily.     carbidopa-levodopa 25-100 MG tablet  Commonly known as:  SINEMET IR  Take 2 tablets by mouth 3 (three) times daily.     carboxymethylcellulose 0.5 % Soln  Commonly known as:  REFRESH PLUS  Place 1 drop into both eyes 4 (four) times daily as needed.     fluticasone furoate-vilanterol 100-25 MCG/INH Aepb  Commonly known as:  BREO ELLIPTA  Inhale 1 puff into the lungs daily.     guaifenesin 400 MG Tabs tablet  Commonly known as:  HUMIBID E  Take 400 mg by mouth 3 (three) times daily.     loratadine 10 MG tablet  Commonly known as:  CLARITIN  Take 10 mg by mouth daily as needed for allergies.     meclizine 25 MG tablet  Commonly known as:  ANTIVERT  Take 25 mg by mouth 3 (three) times daily as needed for dizziness.     omeprazole 20 MG capsule  Commonly known as:  PRILOSEC  Take 20 mg by mouth daily.     primidone 50 MG tablet  Commonly known as:  MYSOLINE  Take 150 mg by mouth 2 (two) times  daily.     ranitidine 300 MG capsule  Commonly known as:  ZANTAC  Take 300 mg by mouth at bedtime.     simvastatin 20 MG tablet  Commonly known as:  ZOCOR  Take 1 tablet (20 mg total) by mouth every evening.     traMADol 50 MG tablet  Commonly known as:  ULTRAM  Take 50 mg by mouth 3 (three) times daily as needed for moderate pain.     vitamin B-12 500 MCG tablet  Commonly known as:  CYANOCOBALAMIN  Take 1,000 mcg by mouth daily.        Patients skin is clean, dry and intact, no evidence of skin break down. IV site discontinued and catheter remains intact. Site without signs and symptoms of complications. Dressing and pressure applied.  Patient escorted to car by NT in a wheelchair,  no distress noted upon discharge.  Jeffrey Blair 01/25/2016 5:42 PM

## 2016-01-25 NOTE — Discharge Summary (Signed)
Physician Discharge Summary  Jeffrey Blair [id number] DOB: 20-May-1928 DOA: 01/24/2016  PCP: [place] date: 01/24/2016 Discharge date: 01/25/2016  Time spent: Greater than 30 minutes  Recommendations for Outpatient Follow-up:  1. Recommend consideration for MRI of the cervical spine for complete evaluation of C3-C4 bulging disc.  2. Recommend follow-up of the patient's blood pressure as he may require pharmacological treatment.   Discharge Diagnoses:  1. TIA. 2. Hyperlipidemia. 3. Cerebrovascular disease. 4. Elevated blood pressure, not sustained. 5. Cervical disc disease with protrusion/ bulge. 6. Parkinson's disease. 7. COPD.   Discharge Condition: Stable  Diet recommendation: Heart healthy  Filed Weights   01/24/16 1505 01/24/16 2100  Weight: 81.647 kg (180 lb) 76.25 kg (168 lb 1.6 oz)    History of present illness:  Patient is an 82 year old man with a history of prostate cancer in remission, Parkinson's disease, hyperlipidemia, migraine headaches, who presented to the ED on 01/24/2016 with a chief complaint of left sided weakness and slurred speech. In the ED, he was afebrile and hemodynamically stable, but will with mildly elevated blood pressures. CT of the head revealed no acute intracranial findings, but with mild chronic small vessel ischemia. His chest x-ray revealed no acute findings. His lab data were virtually unremarkable. He was admitted for further evaluation and management.  Hospital Course:  Apparently, the patient's left-sided weakness and slurred speech has resolved on admission. He passed the bedside swallow evaluation. He was started on antiplatelet therapy with aspirin. Apparently he had not been taking aspirin on a regular basis. He was restarted on simvastatin. For further evaluation, a number of studies were ordered. MRI of the brain revealed no acute infarct or hemorrhage, but with moderate chronic microvascular changes and mild  global atrophy. It also revealed an incomplete assessment of C3-C4 old/protrusion with mild cord contact. MRA of the brain revealed marked narrowing in the proximal M2 segment of the left middle cerebral artery and posterior cerebral artery. Carotid ultrasound revealed no ICA stenosis bilaterally. Echocardiogram revealed mild to moderate LVH, EF of 65-70%, and grade 1 diastolic dysfunction. Fasting lipid profile revealed a total cholesterol of 166, triglycerides of 54, HDL of 51, and LDL of 104.  Patient remained stable during hospitalization. Physical therapy evaluated him and recommended home health PT if desired. Patient's blood pressure waxed and waned, but it was 124/64 at the time of discharge. He may require outpatient treatment with a BP medication, but one was not started during the hospitalization in the setting of a TIA and a normal blood pressure at the time of discharge. Due to the brain vessel atherosclerosis, simvastatin was increased from 10 mg to 20 mg daily. He was discharged also on 81 mg daily.    Procedures: 2-D echo on 01/25/16: Study Conclusions - Left ventricle: The cavity size was normal. Wall thickness was  increased increased in a pattern of mild to moderate LVH.  Systolic function was vigorous. The estimated ejection fraction  was in the range of 65% to 70%. Wall motion was normal; there  were no regional wall motion abnormalities. Doppler parameters  are consistent with abnormal left ventricular relaxation (grade 1  diastolic dysfunction). - Aortic valve: Valve area (VTI): 4.18 cm^2. Valve area (Vmax):  3.46 cm^2. - Atrial septum: No defect or patent foramen ovale was identified.  - Technically adequate study.  Consultations:  None  Discharge Exam: Filed Vitals:   01/25/16 1331 01/25/16 1430  BP: 160/80 124/64  Pulse: 68 72  Temp:  Resp:  18  Oxygen saturation 99% on room air. Temperature 97.6.  General: Pleasant elderly 82 year old  African-American man in no acute distress. Cardiovascular: S1, S2, no murmurs rubs or gallops. Respiratory: Clear to auscultation bilaterally. Neurologic: He is alert and oriented to himself, son, and place. Cranial nerves II through XII are grossly intact. He has some overshoot and moderate tremor of his left upper extremity greater than right upper extremity (presumably from Parkinson's disease). With finger to nose testing. No pronator drift. Handgrip strength bilaterally 5 over 5. Patient is able to lift each leg against gravity with resistance. Gait not assessed, but noted by PT.  Discharge Instructions   Discharge Instructions    Diet - low sodium heart healthy    Complete by:  As directed      Discharge instructions    Complete by:  As directed   You had a TIA, but not a stroke. MRI of your brain showed that you have atherosclerosis of the brain vessels. MRI also showed incomplete imaging of a bulging disc at the C3-C4 cervical vertebrae. Your carotid ultrasound did not show any blockages. Echocardiogram of your heart showed an ejection fraction of 65-70% which is normal. Your LDL cholesterol was 104, slightly elevated. You should take an 81 mg aspirin daily and increase the dose of the simvastatin to 1 tablet each evening     Increase activity slowly    Complete by:  As directed           Current Discharge Medication List    START taking these medications   Details  aspirin 81 MG chewable tablet Chew 1 tablet (81 mg total) by mouth daily.      CONTINUE these medications which have CHANGED   Details  simvastatin (ZOCOR) 20 MG tablet Take 1 tablet (20 mg total) by mouth every evening. Qty: 30 tablet, Refills: 3      CONTINUE these medications which have NOT CHANGED   Details  albuterol (PROVENTIL HFA;VENTOLIN HFA) 108 (90 Base) MCG/ACT inhaler Inhale 2 puffs into the lungs every 6 (six) hours as needed for wheezing or shortness of breath. Qty: 1 Inhaler, Refills: 2     albuterol (PROVENTIL) (2.5 MG/3ML) 0.083% nebulizer solution Take 3 mLs (2.5 mg total) by nebulization every 6 (six) hours as needed for wheezing or shortness of breath. Qty: 75 mL, Refills: 12    benzonatate (TESSALON) 100 MG capsule Take 1 capsule (100 mg total) by mouth 3 (three) times daily as needed for cough. Qty: 45 capsule, Refills: 1    Calcium Carbonate-Vitamin D (CALCARB 600/D) 600-400 MG-UNIT tablet Take 1 tablet by mouth 2 (two) times daily.    carbidopa-levodopa (SINEMET IR) 25-100 MG per tablet Take 2 tablets by mouth 3 (three) times daily.    carboxymethylcellulose (REFRESH PLUS) 0.5 % SOLN Place 1 drop into both eyes 4 (four) times daily as needed.    fluticasone furoate-vilanterol (BREO ELLIPTA) 100-25 MCG/INH AEPB Inhale 1 puff into the lungs daily. Qty: 60 each, Refills: 5    guaifenesin (HUMIBID E) 400 MG TABS tablet Take 400 mg by mouth 3 (three) times daily.    loratadine (CLARITIN) 10 MG tablet Take 10 mg by mouth daily as needed for allergies.    meclizine (ANTIVERT) 25 MG tablet Take 25 mg by mouth 3 (three) times daily as needed for dizziness.    omeprazole (PRILOSEC) 20 MG capsule Take 20 mg by mouth daily.    primidone (MYSOLINE) 50 MG tablet Take 150 mg  by mouth 2 (two) times daily.    ranitidine (ZANTAC) 300 MG capsule Take 300 mg by mouth at bedtime.     traMADol (ULTRAM) 50 MG tablet Take 50 mg by mouth 3 (three) times daily as needed for moderate pain.     vitamin B-12 (CYANOCOBALAMIN) 500 MCG tablet Take 1,000 mcg by mouth daily.        Allergies  Allergen Reactions  . Morphine Anaphylaxis, Shortness Of Breath and Other (See Comments)    Altered mental status; takes tramadol at home  . Penicillins Other (See Comments)       Follow-up Information    Please follow up.   Why:  FOLLOW UP WITH YOUR DOCTOR IN 1-2 WEEKS.       The results of significant diagnostics from this hospitalization (including imaging, microbiology, ancillary and  laboratory) are listed below for reference.    Significant Diagnostic Studies: Mr Virgel Paling Wo Contrast  01/25/2016  CLINICAL DATA:  82 year old male with history of Parkinson's disease, dementia and migraine headaches presenting with weakness and confusion for the past 2 days. Prostate cancer. Subsequent encounter. EXAM: MRI HEAD WITHOUT CONTRAST MRA HEAD WITHOUT CONTRAST TECHNIQUE: Multiplanar, multiecho pulse sequences of the brain and surrounding structures were obtained without intravenous contrast. Angiographic images of the head were obtained using MRA technique without contrast. COMPARISON:  01/24/2016 head CT.  No comparison brain MR. FINDINGS: MRI HEAD FINDINGS No acute infarct or intracranial hemorrhage. Moderate chronic microvascular changes. Global mild atrophy without hydrocephalus. No intracranial mass lesion noted on this unenhanced exam. No acute orbital abnormality. C3-4 bulge/protrusion with mild cord contact incompletely assessed. Minimal to mild mucosal thickening maxillary sinuses and ethmoid sinus air cells. MRA HEAD FINDINGS Majority of the distal vertebral arteries, posterior inferior cerebellar arteries, anterior inferior cerebellar arteries and basilar artery are not visualized. Posterior cerebral artery is predominantly supplied from the anterior circulation. Moderate to marked narrowing portions of the posterior cerebral arch far laterally (P2 segment on the right and distally bilaterally, with narrowing greater on the left). Artifact versus narrowing internal carotid artery proximal petrous segment greater on the right. Marked narrowing proximal M2 segment left middle cerebral artery. Mild narrowing proximal M1 segment left middle cerebral artery. IMPRESSION: MRI HEAD No acute infarct or intracranial hemorrhage. Moderate chronic microvascular changes. Global mild atrophy without hydrocephalus. C3-4 bulge/protrusion with mild cord contact incompletely assessed. Minimal to mild mucosal  thickening maxillary sinuses and ethmoid sinus air cells. MRA HEAD Majority of the distal vertebral arteries, posterior inferior cerebellar arteries, anterior inferior cerebellar arteries and basilar artery are not visualized. Posterior cerebral artery predominantly supplied from the anterior circulation. Moderate to marked narrowing portions of the posterior cerebral arch far laterally (P2 segment on the right and distally bilaterally, with narrowing greater on the left). Artifact versus narrowing internal carotid artery proximal petrous segment greater on the right. Marked narrowing proximal M2 segment left middle cerebral artery. Electronically Signed   By: Genia Del M.D.   On: 01/25/2016 08:07   Mr Brain Wo Contrast  01/25/2016  CLINICAL DATA:  82 year old male with history of Parkinson's disease, dementia and migraine headaches presenting with weakness and confusion for the past 2 days. Prostate cancer. Subsequent encounter. EXAM: MRI HEAD WITHOUT CONTRAST MRA HEAD WITHOUT CONTRAST TECHNIQUE: Multiplanar, multiecho pulse sequences of the brain and surrounding structures were obtained without intravenous contrast. Angiographic images of the head were obtained using MRA technique without contrast. COMPARISON:  01/24/2016 head CT.  No comparison brain MR. FINDINGS: MRI HEAD  FINDINGS No acute infarct or intracranial hemorrhage. Moderate chronic microvascular changes. Global mild atrophy without hydrocephalus. No intracranial mass lesion noted on this unenhanced exam. No acute orbital abnormality. C3-4 bulge/protrusion with mild cord contact incompletely assessed. Minimal to mild mucosal thickening maxillary sinuses and ethmoid sinus air cells. MRA HEAD FINDINGS Majority of the distal vertebral arteries, posterior inferior cerebellar arteries, anterior inferior cerebellar arteries and basilar artery are not visualized. Posterior cerebral artery is predominantly supplied from the anterior circulation. Moderate  to marked narrowing portions of the posterior cerebral arch far laterally (P2 segment on the right and distally bilaterally, with narrowing greater on the left). Artifact versus narrowing internal carotid artery proximal petrous segment greater on the right. Marked narrowing proximal M2 segment left middle cerebral artery. Mild narrowing proximal M1 segment left middle cerebral artery. IMPRESSION: MRI HEAD No acute infarct or intracranial hemorrhage. Moderate chronic microvascular changes. Global mild atrophy without hydrocephalus. C3-4 bulge/protrusion with mild cord contact incompletely assessed. Minimal to mild mucosal thickening maxillary sinuses and ethmoid sinus air cells. MRA HEAD Majority of the distal vertebral arteries, posterior inferior cerebellar arteries, anterior inferior cerebellar arteries and basilar artery are not visualized. Posterior cerebral artery predominantly supplied from the anterior circulation. Moderate to marked narrowing portions of the posterior cerebral arch far laterally (P2 segment on the right and distally bilaterally, with narrowing greater on the left). Artifact versus narrowing internal carotid artery proximal petrous segment greater on the right. Marked narrowing proximal M2 segment left middle cerebral artery. Electronically Signed   By: Genia Del M.D.   On: 01/25/2016 08:07   US Carotid Bilateral  01/25/2016  CLINICAL DATA:  Increasing left-sided weakness over 2 days EXAM: BILATERAL CAROTID DUPLEX ULTRASOUND TECHNIQUE: Pearline Cables scale imaging, color Doppler and duplex ultrasound were performed of bilateral carotid and vertebral arteries in the neck. COMPARISON:  None. FINDINGS: Criteria: Quantification of carotid stenosis is based on velocity parameters that correlate the residual internal carotid diameter with NASCET-based stenosis levels, using the diameter of the distal internal carotid lumen as the denominator for stenosis measurement. The following velocity  measurements were obtained: RIGHT ICA:  99/32 cm/sec CCA:  123456 cm/sec SYSTOLIC ICA/CCA RATIO:  1.0 DIASTOLIC ICA/CCA RATIO:  2.4 ECA:  82 cm/sec LEFT ICA:  80/24 cm/sec CCA:  [id number] cm/sec SYSTOLIC ICA/CCA RATIO:  0.7 DIASTOLIC ICA/CCA RATIO:  1.3 ECA:  72 cm/sec RIGHT CAROTID ARTERY: Preliminary grayscale images demonstrate mild atherosclerotic plaque in the region of the carotid bulb and proximal internal carotid artery. The waveforms, velocities and flow velocity ratios however demonstrate no evidence of focal hemodynamically significant stenosis. RIGHT VERTEBRAL ARTERY:  Antegrade in nature. LEFT CAROTID ARTERY: Grayscale images demonstrate mild atherosclerotic plaque of the carotid bulb. The waveforms, velocities and flow velocity ratios however demonstrate no evidence of focal hemodynamically significant stenosis. LEFT VERTEBRAL ARTERY:  Antegrade in nature. IMPRESSION: Mild plaque bilaterally without focal hemodynamically significant stenosis. Electronically Signed   By: Inez Catalina M.D.   On: 01/25/2016 08:44   Dg Chest Port 1 View  01/24/2016  CLINICAL DATA:  Weakness EXAM: PORTABLE CHEST 1 VIEW COMPARISON:  11/27/2015 chest radiograph. FINDINGS: Stable cardiomediastinal silhouette with normal heart size. No pneumothorax. No pleural effusion. No pulmonary edema. Stable biapical pleural-parenchymal scarring. No acute consolidative airspace disease. IMPRESSION: No active disease. Electronically Signed   By: Ilona Sorrel M.D.   On: 01/24/2016 15:48   Ct Head Code Stroke W/o Cm  01/24/2016  CLINICAL DATA:  Left-sided weakness. Code stroke. Prostate cancer in remission. Parkinson  disease. EXAM: CT HEAD WITHOUT CONTRAST TECHNIQUE: Contiguous axial images were obtained from the base of the skull through the vertex without intravenous contrast. COMPARISON:  12/20/2014 head CT. FINDINGS: No evidence of parenchymal hemorrhage or extra-axial fluid collection. No mass lesion, mass effect, or midline shift. No  CT evidence of acute infarction. Intracranial atherosclerosis. Nonspecific mild subcortical and periventricular white matter hypodensity, most in keeping with chronic small vessel ischemic change. Cerebral volume is age appropriate. No ventriculomegaly. Mucoperiosteal thickening in the visualized bilateral maxillary sinuses. No fluid levels in the visualized paranasal sinuses. The mastoid air cells are unopacified. No evidence of calvarial fracture. IMPRESSION: 1.  No evidence of acute intracranial abnormality. 2. Mild chronic small vessel ischemia . 3. Mild paranasal sinusitis, probably chronic. These results were called by telephone at the time of interpretation on 01/24/2016 at 3:05 pm to Dr. Milton Ferguson , who verbally acknowledged these results. Electronically Signed   By: Ilona Sorrel M.D.   On: 01/24/2016 15:04    Microbiology: No results found for this or any previous visit (from the past 240 hour(s)).   Labs: Basic Metabolic Panel:  Recent Labs Lab 01/24/16 1507 01/24/16 1528  NA 137 140  K 3.9 3.9  CL 103 100*  CO2 30  --   GLUCOSE 104* 100*  BUN 15 13  CREATININE 0.75 0.80  CALCIUM 8.9  --    Liver Function Tests:  Recent Labs Lab 01/24/16 1507  AST 24  ALT 19  ALKPHOS 68  BILITOT 0.6  PROT 7.3  ALBUMIN 3.9   No results for input(s): LIPASE, AMYLASE in the last 168 hours. No results for input(s): AMMONIA in the last 168 hours. CBC:  Recent Labs Lab 01/24/16 1507 01/24/16 1528  WBC 6.1  --   NEUTROABS 3.7  --   HGB 13.0 13.9  HCT 39.3 41.0  MCV 87.7  --   PLT 183  --    Cardiac Enzymes: No results for input(s): CKTOTAL, CKMB, CKMBINDEX, TROPONINI in the last 168 hours. BNP: BNP (last 3 results) No results for input(s): BNP in the last 8760 hours.  ProBNP (last 3 results) No results for input(s): PROBNP in the last 8760 hours.  CBG: No results for input(s): GLUCAP in the last 168 hours.     Signed:  Chenel Wernli MD.  [facility] 01/25/2016, 5:01 PM

## 2016-01-25 NOTE — Progress Notes (Signed)
PT Cancellation Note  Patient Details Name: Jeffrey Blair MRN: [id number] DOB: [birth year]   Cancelled Treatment:    Reason Eval/Treat Not Completed: Other (comment);Patient's level of consciousness (Pt's son states he didn't have a restful night, and has been very busy with testing this AM.  Requesting that pt is allowed to sleep.  Will check back later. )  Beth Claudell Wohler, PT, DPT X: [phone]

## 2016-01-25 NOTE — Progress Notes (Signed)
SLP Cancellation Note  Patient Details Name: Jeffrey Blair MRN: [id number] DOB: [birth year]   Cancelled treatment:       Reason Eval/Treat Not Completed: Patient declined, no reason specified; Pt passed RN swallow screen. Pt currently resting after earlier procedures. SLP will check back later.   Thank you,  Genene Churn, [place]    [place] 01/25/2016, 10:18 AM

## 2016-01-26 LAB — HEMOGLOBIN A1C
HEMOGLOBIN A1C: 5.9 % — AB (ref 4.8–5.6)
MEAN PLASMA GLUCOSE: 123 mg/dL

## 2016-01-27 DIAGNOSIS — J449 Chronic obstructive pulmonary disease, unspecified: Secondary | ICD-10-CM | POA: Diagnosis not present

## 2016-01-27 DIAGNOSIS — Z8546 Personal history of malignant neoplasm of prostate: Secondary | ICD-10-CM | POA: Diagnosis not present

## 2016-01-27 DIAGNOSIS — Z7982 Long term (current) use of aspirin: Secondary | ICD-10-CM | POA: Diagnosis not present

## 2016-01-27 DIAGNOSIS — G20 Parkinson's disease: Secondary | ICD-10-CM | POA: Diagnosis not present

## 2016-01-27 DIAGNOSIS — I69354 Hemiplegia and hemiparesis following cerebral infarction affecting left non-dominant side: Secondary | ICD-10-CM | POA: Diagnosis not present

## 2016-01-27 DIAGNOSIS — Z7951 Long term (current) use of inhaled steroids: Secondary | ICD-10-CM | POA: Diagnosis not present

## 2016-01-27 DIAGNOSIS — I10 Essential (primary) hypertension: Secondary | ICD-10-CM | POA: Diagnosis not present

## 2016-01-27 DIAGNOSIS — M5090 Cervical disc disorder, unspecified, unspecified cervical region: Secondary | ICD-10-CM | POA: Diagnosis not present

## 2016-01-27 DIAGNOSIS — F0390 Unspecified dementia without behavioral disturbance: Secondary | ICD-10-CM | POA: Diagnosis not present

## 2016-01-30 ENCOUNTER — Encounter (HOSPITAL_COMMUNITY): Payer: Self-pay | Admitting: Emergency Medicine

## 2016-01-30 ENCOUNTER — Emergency Department (HOSPITAL_COMMUNITY): Payer: PPO

## 2016-01-30 ENCOUNTER — Emergency Department (HOSPITAL_COMMUNITY)
Admission: EM | Admit: 2016-01-30 | Discharge: 2016-01-30 | Disposition: A | Discharge status: Home / Self Care | Payer: PPO | Attending: Emergency Medicine | Admitting: Emergency Medicine

## 2016-01-30 DIAGNOSIS — Z8546 Personal history of malignant neoplasm of prostate: Secondary | ICD-10-CM | POA: Diagnosis not present

## 2016-01-30 DIAGNOSIS — Z8673 Personal history of transient ischemic attack (TIA), and cerebral infarction without residual deficits: Secondary | ICD-10-CM | POA: Diagnosis not present

## 2016-01-30 DIAGNOSIS — Z79899 Other long term (current) drug therapy: Secondary | ICD-10-CM | POA: Insufficient documentation

## 2016-01-30 DIAGNOSIS — G3183 Dementia with Lewy bodies: Secondary | ICD-10-CM | POA: Insufficient documentation

## 2016-01-30 DIAGNOSIS — R531 Weakness: Secondary | ICD-10-CM | POA: Insufficient documentation

## 2016-01-30 DIAGNOSIS — R079 Chest pain, unspecified: Secondary | ICD-10-CM | POA: Diagnosis not present

## 2016-01-30 DIAGNOSIS — Z7982 Long term (current) use of aspirin: Secondary | ICD-10-CM | POA: Insufficient documentation

## 2016-01-30 DIAGNOSIS — Z87891 Personal history of nicotine dependence: Secondary | ICD-10-CM | POA: Diagnosis not present

## 2016-01-30 DIAGNOSIS — R42 Dizziness and giddiness: Secondary | ICD-10-CM | POA: Diagnosis not present

## 2016-01-30 LAB — CBC WITH DIFFERENTIAL/PLATELET
BASOS ABS: 0 10*3/uL (ref 0.0–0.1)
BASOS PCT: 0 %
EOS ABS: 0.3 10*3/uL (ref 0.0–0.7)
EOS PCT: 5 %
HCT: 39 % (ref 39.0–52.0)
Hemoglobin: 12.8 g/dL — ABNORMAL LOW (ref 13.0–17.0)
Lymphocytes Relative: 22 %
Lymphs Abs: 1.3 10*3/uL (ref 0.7–4.0)
MCH: 28.9 pg (ref 26.0–34.0)
MCHC: 32.8 g/dL (ref 30.0–36.0)
MCV: 88 fL (ref 78.0–100.0)
MONO ABS: 0.5 10*3/uL (ref 0.1–1.0)
MONOS PCT: 9 %
NEUTROS ABS: 3.7 10*3/uL (ref 1.7–7.7)
Neutrophils Relative %: 64 %
PLATELETS: 181 10*3/uL (ref 150–400)
RBC: 4.43 MIL/uL (ref 4.22–5.81)
RDW: 13.2 % (ref 11.5–15.5)
WBC: 5.8 10*3/uL (ref 4.0–10.5)

## 2016-01-30 LAB — URINALYSIS, ROUTINE W REFLEX MICROSCOPIC
BILIRUBIN URINE: NEGATIVE
Glucose, UA: NEGATIVE mg/dL
HGB URINE DIPSTICK: NEGATIVE
KETONES UR: NEGATIVE mg/dL
Leukocytes, UA: NEGATIVE
NITRITE: NEGATIVE
PROTEIN: NEGATIVE mg/dL
Specific Gravity, Urine: 1.01 (ref 1.005–1.030)
pH: 7 (ref 5.0–8.0)

## 2016-01-30 LAB — BASIC METABOLIC PANEL
ANION GAP: 8 (ref 5–15)
BUN: 14 mg/dL (ref 6–20)
CALCIUM: 9.2 mg/dL (ref 8.9–10.3)
CHLORIDE: 100 mmol/L — AB (ref 101–111)
CO2: 30 mmol/L (ref 22–32)
Creatinine, Ser: 0.77 mg/dL (ref 0.61–1.24)
GFR calc Af Amer: >60 mL/min (ref >60)
Glucose, Bld: 95 mg/dL (ref 65–99)
POTASSIUM: 3.6 mmol/L (ref 3.5–5.1)
SODIUM: 138 mmol/L (ref 135–145)

## 2016-01-30 LAB — TROPONIN I

## 2016-01-30 NOTE — ED Notes (Signed)
Patient and family verbalize understanding of discharge instructions, home care and follow up care. Patient out of department at this time with family. 

## 2016-01-30 NOTE — Discharge Instructions (Signed)

## 2016-01-30 NOTE — ED Provider Notes (Signed)
CSN: [id number]     Arrival date & time 01/30/16  [id number] History   By signing my name below, I, Dyke Brackett, attest that this documentation has been prepared under the direction and in the presence of Davonna Belling, MD . Electronically Signed: Dyke Brackett, Scribe. 01/30/2016. 11:47 AM   Chief Complaint  Patient presents with  . Chest Pain     Patient is a 82 y.o. male presenting with chest pain. The history is provided by the patient and a relative. No language interpreter was used.  Chest Pain Associated symptoms: dizziness and weakness   Associated symptoms: no fever, no headache and no shortness of breath     HPI Comments:  Jeffrey Blair is a 82 y.o. male with PMHx of dementia and parkinson disease who presents to the Emergency Department complaining of mild, left sided chest pain onset this morning. Pt was seen in ED four days ago for similar symptoms. Pt also complains of associated generalized weakness and dizziness. Per son, pt was complaining of chest pain and changes in speech this morning. He states his speech is lower than usual. Pt was repeating himself often, but per son, that is typical. Pt denies fever, chills, trouble breathing, headaches, changes in appetite, or dysuria. No alleviating factors or other complaints noted at this time.   Past Medical History  Diagnosis Date  . Cancer ([place])     prostate-in remission  . Dementia   . Parkinson disease ([place])   . Migraine   . Cerebrovascular disease 01/25/2016  . TIA (transient ischemic attack) 01/24/2016  . Cervical disc disease 01/25/2016   History reviewed. No pertinent past surgical history. Family History  Problem Relation Age of Onset  . Allergies Child   . Cancer Father     lung cancer  . Cancer Brother     colon  . Cancer Sister    Social History  Substance Use Topics  . Smoking status: Former Smoker -- 0.50 packs/day for 20 years    Types: Cigarettes    Quit date: 04/07/1985  . Smokeless tobacco:  Never Used  . Alcohol Use: No    Review of Systems  Constitutional: Negative for fever, chills and appetite change.  Respiratory: Negative for apnea and shortness of breath.   Cardiovascular: Positive for chest pain.  Genitourinary: Negative for dysuria.  Neurological: Positive for dizziness and weakness. Negative for headaches.  All other systems reviewed and are negative.     Allergies  Morphine and Penicillins  Home Medications   Prior to Admission medications   Medication Sig Start Date End Date Taking? Authorizing Provider  albuterol (PROVENTIL HFA;VENTOLIN HFA) 108 (90 Base) MCG/ACT inhaler Inhale 2 puffs into the lungs every 6 (six) hours as needed for wheezing or shortness of breath. 01/11/16  Yes Juanito Doom, MD  albuterol (PROVENTIL) (2.5 MG/3ML) 0.083% nebulizer solution Take 3 mLs (2.5 mg total) by nebulization every 6 (six) hours as needed for wheezing or shortness of breath. 01/11/16  Yes Juanito Doom, MD  aspirin 81 MG chewable tablet Chew 1 tablet (81 mg total) by mouth daily. 01/25/16  Yes Rexene Alberts, MD  benzonatate (TESSALON) 100 MG capsule Take 1 capsule (100 mg total) by mouth 3 (three) times daily as needed for cough. 11/27/15  Yes Juanito Doom, MD  Calcium Carbonate-Vitamin D (CALCARB 600/D) 600-400 MG-UNIT tablet Take 1 tablet by mouth 2 (two) times daily.   Yes Historical Provider, MD  carbidopa-levodopa (SINEMET IR) 25-100 MG per tablet  Take 2 tablets by mouth 3 (three) times daily.   Yes Historical Provider, MD  carboxymethylcellulose (REFRESH PLUS) 0.5 % SOLN Place 1 drop into both eyes 4 (four) times daily as needed (dry eyes).    Yes Historical Provider, MD  fluticasone furoate-vilanterol (BREO ELLIPTA) 100-25 MCG/INH AEPB Inhale 1 puff into the lungs daily. 01/11/16  Yes Juanito Doom, MD  guaifenesin (HUMIBID E) 400 MG TABS tablet Take 400 mg by mouth 3 (three) times daily.   Yes Historical Provider, MD  loratadine (CLARITIN) 10 MG  tablet Take 10 mg by mouth daily as needed for allergies.   Yes Historical Provider, MD  meclizine (ANTIVERT) 25 MG tablet Take 25 mg by mouth 3 (three) times daily as needed for dizziness.   Yes Historical Provider, MD  omeprazole (PRILOSEC) 20 MG capsule Take 20 mg by mouth daily.   Yes Historical Provider, MD  primidone (MYSOLINE) 50 MG tablet Take 150 mg by mouth 2 (two) times daily.   Yes Historical Provider, MD  ranitidine (ZANTAC) 300 MG capsule Take 300 mg by mouth at bedtime.    Yes Historical Provider, MD  simvastatin (ZOCOR) 20 MG tablet Take 1 tablet (20 mg total) by mouth every evening. 01/25/16  Yes Rexene Alberts, MD  traMADol (ULTRAM) 50 MG tablet Take 50 mg by mouth 3 (three) times daily as needed for moderate pain.    Yes Historical Provider, MD  vitamin B-12 (CYANOCOBALAMIN) 500 MCG tablet Take 1,000 mcg by mouth daily.    Yes Historical Provider, MD   BP 149/76 mmHg  Pulse 77  Temp(Src) 98.4 F (36.9 C)  Resp 24  Ht 6\' 1"  (1.854 m)  Wt 170 lb (77.111 kg)  BMI 22.43 kg/m2  SpO2 97% Physical Exam  Constitutional: He is oriented to person, place, and time. He appears well-developed and well-nourished. No distress.  HENT:  Head: Normocephalic and atraumatic.  slight facial asymmetry- normal per family member   Eyes: Conjunctivae are normal.  Cardiovascular: Normal rate.   Pulmonary/Chest: Effort normal.  Abdominal: He exhibits no distension. There is no tenderness.  Neurological: He is alert and oriented to person, place, and time.  Awake, appropriate,  Good grip strengths  Skin: Skin is warm and dry.  Psychiatric: He has a normal mood and affect.  Nursing note and vitals reviewed.   ED Course  Procedures  DIAGNOSTIC STUDIES:  Oxygen Saturation is 97% on RA, normal by my interpretation.    COORDINATION OF CARE:  10:02 AM Will order DG chest, BMP, troponin, CBC, and urinaylsis Discussed treatment plan with pt at bedside and pt agreed to plan.  Labs  Review Labs Reviewed  BASIC METABOLIC PANEL - Abnormal; Notable for the following:    Chloride 100 (*)    All other components within normal limits  CBC WITH DIFFERENTIAL/PLATELET - Abnormal; Notable for the following:    Hemoglobin 12.8 (*)    All other components within normal limits  TROPONIN I  URINALYSIS, ROUTINE W REFLEX MICROSCOPIC (NOT AT [facility])    Imaging Review Dg Chest 2 View  01/30/2016  CLINICAL DATA:  Chest pain EXAM: CHEST  2 VIEW COMPARISON:  01/24/2016 FINDINGS: The heart size and mediastinal contours are within normal limits. Both lungs are clear. The visualized skeletal structures are unremarkable. IMPRESSION: No active cardiopulmonary disease. Electronically Signed   By: Inez Catalina M.D.   On: 01/30/2016 10:40   I have personally reviewed and evaluated these images and lab results as part of my  medical decision-making.   EKG Interpretation   Date/Time:  Saturday January 30 2016 09:52:05 EDT Ventricular Rate:  74 PR Interval:    QRS Duration: 95 QT Interval:  390 QTC Calculation: 433 R Axis:   83 Text Interpretation:  Sinus rhythm Borderline right axis deviation  Confirmed by Alvino Chapel  MD, Tiaja Hagan [phone]) on 01/30/2016 9:56:30 AM      MDM   Final diagnoses:  Generalized weakness     Patient generalized weakness. Question chest pain. Has had some memory issues overall.Lab work overall reassuring. Patient is eager to be discharged and will be discharged. I personally performed the services described in this documentation, which was scribed in my presence. The recorded information has been reviewed and is accurate.       Davonna Belling, MD 01/30/16 [phone]

## 2016-01-30 NOTE — ED Notes (Signed)
Per EMS, pt called out for CP. Pain is in LT lower chest/ribcage. Pt also reports vertigo and generalized weakness. Pt treated last week in ED for same complaint. Pt in NAD.

## 2016-02-01 DIAGNOSIS — Z7951 Long term (current) use of inhaled steroids: Secondary | ICD-10-CM | POA: Diagnosis not present

## 2016-02-01 DIAGNOSIS — Z8546 Personal history of malignant neoplasm of prostate: Secondary | ICD-10-CM | POA: Diagnosis not present

## 2016-02-01 DIAGNOSIS — I69354 Hemiplegia and hemiparesis following cerebral infarction affecting left non-dominant side: Secondary | ICD-10-CM | POA: Diagnosis not present

## 2016-02-01 DIAGNOSIS — J449 Chronic obstructive pulmonary disease, unspecified: Secondary | ICD-10-CM | POA: Diagnosis not present

## 2016-02-01 DIAGNOSIS — M5090 Cervical disc disorder, unspecified, unspecified cervical region: Secondary | ICD-10-CM | POA: Diagnosis not present

## 2016-02-01 DIAGNOSIS — F0390 Unspecified dementia without behavioral disturbance: Secondary | ICD-10-CM | POA: Diagnosis not present

## 2016-02-01 DIAGNOSIS — Z7982 Long term (current) use of aspirin: Secondary | ICD-10-CM | POA: Diagnosis not present

## 2016-02-01 DIAGNOSIS — I10 Essential (primary) hypertension: Secondary | ICD-10-CM | POA: Diagnosis not present

## 2016-02-01 DIAGNOSIS — G20 Parkinson's disease: Secondary | ICD-10-CM | POA: Diagnosis not present

## 2016-02-11 DIAGNOSIS — Z7951 Long term (current) use of inhaled steroids: Secondary | ICD-10-CM | POA: Diagnosis not present

## 2016-02-11 DIAGNOSIS — M5090 Cervical disc disorder, unspecified, unspecified cervical region: Secondary | ICD-10-CM | POA: Diagnosis not present

## 2016-02-11 DIAGNOSIS — Z8546 Personal history of malignant neoplasm of prostate: Secondary | ICD-10-CM | POA: Diagnosis not present

## 2016-02-11 DIAGNOSIS — Z7982 Long term (current) use of aspirin: Secondary | ICD-10-CM | POA: Diagnosis not present

## 2016-02-11 DIAGNOSIS — G20 Parkinson's disease: Secondary | ICD-10-CM | POA: Diagnosis not present

## 2016-02-11 DIAGNOSIS — I69354 Hemiplegia and hemiparesis following cerebral infarction affecting left non-dominant side: Secondary | ICD-10-CM | POA: Diagnosis not present

## 2016-02-11 DIAGNOSIS — I10 Essential (primary) hypertension: Secondary | ICD-10-CM | POA: Diagnosis not present

## 2016-02-11 DIAGNOSIS — J449 Chronic obstructive pulmonary disease, unspecified: Secondary | ICD-10-CM | POA: Diagnosis not present

## 2016-02-11 DIAGNOSIS — F0390 Unspecified dementia without behavioral disturbance: Secondary | ICD-10-CM | POA: Diagnosis not present

## 2016-02-17 DIAGNOSIS — G20 Parkinson's disease: Secondary | ICD-10-CM | POA: Diagnosis not present

## 2016-02-17 DIAGNOSIS — Z7982 Long term (current) use of aspirin: Secondary | ICD-10-CM | POA: Diagnosis not present

## 2016-02-17 DIAGNOSIS — Z7951 Long term (current) use of inhaled steroids: Secondary | ICD-10-CM | POA: Diagnosis not present

## 2016-02-17 DIAGNOSIS — J449 Chronic obstructive pulmonary disease, unspecified: Secondary | ICD-10-CM | POA: Diagnosis not present

## 2016-02-17 DIAGNOSIS — I10 Essential (primary) hypertension: Secondary | ICD-10-CM | POA: Diagnosis not present

## 2016-02-17 DIAGNOSIS — Z8546 Personal history of malignant neoplasm of prostate: Secondary | ICD-10-CM | POA: Diagnosis not present

## 2016-02-17 DIAGNOSIS — F0390 Unspecified dementia without behavioral disturbance: Secondary | ICD-10-CM | POA: Diagnosis not present

## 2016-02-17 DIAGNOSIS — M5090 Cervical disc disorder, unspecified, unspecified cervical region: Secondary | ICD-10-CM | POA: Diagnosis not present

## 2016-02-17 DIAGNOSIS — I69354 Hemiplegia and hemiparesis following cerebral infarction affecting left non-dominant side: Secondary | ICD-10-CM | POA: Diagnosis not present

## 2016-11-04 DIAGNOSIS — Z8546 Personal history of malignant neoplasm of prostate: Secondary | ICD-10-CM | POA: Diagnosis not present

## 2016-11-04 DIAGNOSIS — J449 Chronic obstructive pulmonary disease, unspecified: Secondary | ICD-10-CM | POA: Diagnosis not present

## 2016-11-04 DIAGNOSIS — R0602 Shortness of breath: Secondary | ICD-10-CM | POA: Diagnosis not present

## 2016-11-04 DIAGNOSIS — R531 Weakness: Secondary | ICD-10-CM | POA: Diagnosis not present

## 2016-11-04 DIAGNOSIS — G20 Parkinson's disease: Secondary | ICD-10-CM | POA: Diagnosis not present

## 2016-11-04 DIAGNOSIS — K219 Gastro-esophageal reflux disease without esophagitis: Secondary | ICD-10-CM | POA: Diagnosis not present

## 2016-11-04 DIAGNOSIS — I10 Essential (primary) hypertension: Secondary | ICD-10-CM | POA: Diagnosis not present

## 2016-11-04 DIAGNOSIS — Z7982 Long term (current) use of aspirin: Secondary | ICD-10-CM | POA: Diagnosis not present

## 2016-11-04 DIAGNOSIS — J209 Acute bronchitis, unspecified: Secondary | ICD-10-CM | POA: Diagnosis not present

## 2016-11-04 DIAGNOSIS — R55 Syncope and collapse: Secondary | ICD-10-CM | POA: Diagnosis not present

## 2016-11-04 DIAGNOSIS — Z79899 Other long term (current) drug therapy: Secondary | ICD-10-CM | POA: Diagnosis not present

## 2020-01-06 DIAGNOSIS — M79671 Pain in right foot: Secondary | ICD-10-CM | POA: Diagnosis not present

## 2020-01-06 DIAGNOSIS — L853 Xerosis cutis: Secondary | ICD-10-CM | POA: Diagnosis not present

## 2020-01-06 DIAGNOSIS — M79672 Pain in left foot: Secondary | ICD-10-CM | POA: Diagnosis not present

## 2020-01-06 DIAGNOSIS — I739 Peripheral vascular disease, unspecified: Secondary | ICD-10-CM | POA: Diagnosis not present

## 2020-01-06 DIAGNOSIS — M79674 Pain in right toe(s): Secondary | ICD-10-CM | POA: Diagnosis not present

## 2020-01-06 DIAGNOSIS — M79675 Pain in left toe(s): Secondary | ICD-10-CM | POA: Diagnosis not present

## 2020-07-25 ENCOUNTER — Inpatient Hospital Stay (HOSPITAL_COMMUNITY)
Admission: EM | Admit: 2020-07-25 | Discharge: 2020-08-26 | DRG: 064 | Disposition: A | Discharge status: Hospice | Payer: PPO | Attending: Internal Medicine | Admitting: Internal Medicine

## 2020-07-25 ENCOUNTER — Other Ambulatory Visit: Payer: Self-pay

## 2020-07-25 ENCOUNTER — Encounter (HOSPITAL_COMMUNITY): Payer: Self-pay | Admitting: *Deleted

## 2020-07-25 ENCOUNTER — Emergency Department (HOSPITAL_COMMUNITY): Payer: PPO

## 2020-07-25 DIAGNOSIS — E871 Hypo-osmolality and hyponatremia: Secondary | ICD-10-CM | POA: Diagnosis not present

## 2020-07-25 DIAGNOSIS — I639 Cerebral infarction, unspecified: Secondary | ICD-10-CM | POA: Diagnosis not present

## 2020-07-25 DIAGNOSIS — E876 Hypokalemia: Secondary | ICD-10-CM | POA: Diagnosis not present

## 2020-07-25 DIAGNOSIS — D696 Thrombocytopenia, unspecified: Secondary | ICD-10-CM | POA: Diagnosis not present

## 2020-07-25 DIAGNOSIS — I6389 Other cerebral infarction: Secondary | ICD-10-CM | POA: Diagnosis not present

## 2020-07-25 DIAGNOSIS — E46 Unspecified protein-calorie malnutrition: Secondary | ICD-10-CM | POA: Diagnosis present

## 2020-07-25 DIAGNOSIS — F32A Depression, unspecified: Secondary | ICD-10-CM | POA: Diagnosis present

## 2020-07-25 DIAGNOSIS — Z20822 Contact with and (suspected) exposure to covid-19: Secondary | ICD-10-CM | POA: Diagnosis present

## 2020-07-25 DIAGNOSIS — D649 Anemia, unspecified: Secondary | ICD-10-CM | POA: Diagnosis present

## 2020-07-25 DIAGNOSIS — R4701 Aphasia: Secondary | ICD-10-CM | POA: Diagnosis present

## 2020-07-25 DIAGNOSIS — E43 Unspecified severe protein-calorie malnutrition: Secondary | ICD-10-CM | POA: Diagnosis not present

## 2020-07-25 DIAGNOSIS — I6523 Occlusion and stenosis of bilateral carotid arteries: Secondary | ICD-10-CM | POA: Diagnosis not present

## 2020-07-25 DIAGNOSIS — I63511 Cerebral infarction due to unspecified occlusion or stenosis of right middle cerebral artery: Secondary | ICD-10-CM | POA: Diagnosis not present

## 2020-07-25 DIAGNOSIS — Z66 Do not resuscitate: Secondary | ICD-10-CM | POA: Diagnosis not present

## 2020-07-25 DIAGNOSIS — I739 Peripheral vascular disease, unspecified: Secondary | ICD-10-CM | POA: Diagnosis not present

## 2020-07-25 DIAGNOSIS — I6322 Cerebral infarction due to unspecified occlusion or stenosis of basilar arteries: Secondary | ICD-10-CM | POA: Diagnosis not present

## 2020-07-25 DIAGNOSIS — I771 Stricture of artery: Secondary | ICD-10-CM | POA: Diagnosis not present

## 2020-07-25 DIAGNOSIS — R519 Headache, unspecified: Secondary | ICD-10-CM | POA: Diagnosis not present

## 2020-07-25 DIAGNOSIS — I6503 Occlusion and stenosis of bilateral vertebral arteries: Secondary | ICD-10-CM | POA: Diagnosis not present

## 2020-07-25 DIAGNOSIS — N179 Acute kidney failure, unspecified: Secondary | ICD-10-CM | POA: Diagnosis not present

## 2020-07-25 DIAGNOSIS — Z9109 Other allergy status, other than to drugs and biological substances: Secondary | ICD-10-CM

## 2020-07-25 DIAGNOSIS — G9341 Metabolic encephalopathy: Secondary | ICD-10-CM | POA: Diagnosis not present

## 2020-07-25 DIAGNOSIS — Z8673 Personal history of transient ischemic attack (TIA), and cerebral infarction without residual deficits: Secondary | ICD-10-CM

## 2020-07-25 DIAGNOSIS — G8104 Flaccid hemiplegia affecting left nondominant side: Secondary | ICD-10-CM | POA: Diagnosis present

## 2020-07-25 DIAGNOSIS — Z8546 Personal history of malignant neoplasm of prostate: Secondary | ICD-10-CM

## 2020-07-25 DIAGNOSIS — G936 Cerebral edema: Secondary | ICD-10-CM | POA: Diagnosis not present

## 2020-07-25 DIAGNOSIS — R29702 NIHSS score 2: Secondary | ICD-10-CM | POA: Diagnosis not present

## 2020-07-25 DIAGNOSIS — I361 Nonrheumatic tricuspid (valve) insufficiency: Secondary | ICD-10-CM | POA: Diagnosis not present

## 2020-07-25 DIAGNOSIS — R636 Underweight: Secondary | ICD-10-CM

## 2020-07-25 DIAGNOSIS — R52 Pain, unspecified: Secondary | ICD-10-CM | POA: Diagnosis not present

## 2020-07-25 DIAGNOSIS — R29718 NIHSS score 18: Secondary | ICD-10-CM | POA: Diagnosis not present

## 2020-07-25 DIAGNOSIS — Z87891 Personal history of nicotine dependence: Secondary | ICD-10-CM | POA: Diagnosis not present

## 2020-07-25 DIAGNOSIS — I959 Hypotension, unspecified: Secondary | ICD-10-CM | POA: Diagnosis not present

## 2020-07-25 DIAGNOSIS — R627 Adult failure to thrive: Secondary | ICD-10-CM | POA: Diagnosis not present

## 2020-07-25 DIAGNOSIS — Z880 Allergy status to penicillin: Secondary | ICD-10-CM

## 2020-07-25 DIAGNOSIS — Z79899 Other long term (current) drug therapy: Secondary | ICD-10-CM | POA: Diagnosis not present

## 2020-07-25 DIAGNOSIS — R471 Dysarthria and anarthria: Secondary | ICD-10-CM | POA: Diagnosis not present

## 2020-07-25 DIAGNOSIS — G459 Transient cerebral ischemic attack, unspecified: Secondary | ICD-10-CM | POA: Diagnosis present

## 2020-07-25 DIAGNOSIS — Z801 Family history of malignant neoplasm of trachea, bronchus and lung: Secondary | ICD-10-CM

## 2020-07-25 DIAGNOSIS — R1312 Dysphagia, oropharyngeal phase: Secondary | ICD-10-CM | POA: Diagnosis present

## 2020-07-25 DIAGNOSIS — F419 Anxiety disorder, unspecified: Secondary | ICD-10-CM | POA: Diagnosis present

## 2020-07-25 DIAGNOSIS — Z885 Allergy status to narcotic agent status: Secondary | ICD-10-CM

## 2020-07-25 DIAGNOSIS — Z7982 Long term (current) use of aspirin: Secondary | ICD-10-CM | POA: Diagnosis not present

## 2020-07-25 DIAGNOSIS — G43909 Migraine, unspecified, not intractable, without status migrainosus: Secondary | ICD-10-CM | POA: Diagnosis present

## 2020-07-25 DIAGNOSIS — N390 Urinary tract infection, site not specified: Secondary | ICD-10-CM | POA: Diagnosis not present

## 2020-07-25 DIAGNOSIS — J441 Chronic obstructive pulmonary disease with (acute) exacerbation: Secondary | ICD-10-CM | POA: Diagnosis not present

## 2020-07-25 DIAGNOSIS — R29810 Facial weakness: Secondary | ICD-10-CM | POA: Diagnosis not present

## 2020-07-25 DIAGNOSIS — R6520 Severe sepsis without septic shock: Secondary | ICD-10-CM | POA: Diagnosis not present

## 2020-07-25 DIAGNOSIS — E785 Hyperlipidemia, unspecified: Secondary | ICD-10-CM | POA: Diagnosis not present

## 2020-07-25 DIAGNOSIS — I63231 Cerebral infarction due to unspecified occlusion or stenosis of right carotid arteries: Secondary | ICD-10-CM | POA: Diagnosis not present

## 2020-07-25 DIAGNOSIS — A4151 Sepsis due to Escherichia coli [E. coli]: Secondary | ICD-10-CM | POA: Diagnosis not present

## 2020-07-25 DIAGNOSIS — R1310 Dysphagia, unspecified: Secondary | ICD-10-CM | POA: Diagnosis not present

## 2020-07-25 DIAGNOSIS — R4587 Impulsiveness: Secondary | ICD-10-CM | POA: Diagnosis present

## 2020-07-25 DIAGNOSIS — M5030 Other cervical disc degeneration, unspecified cervical region: Secondary | ICD-10-CM | POA: Diagnosis present

## 2020-07-25 DIAGNOSIS — R509 Fever, unspecified: Secondary | ICD-10-CM

## 2020-07-25 DIAGNOSIS — J449 Chronic obstructive pulmonary disease, unspecified: Secondary | ICD-10-CM | POA: Diagnosis present

## 2020-07-25 DIAGNOSIS — E440 Moderate protein-calorie malnutrition: Secondary | ICD-10-CM | POA: Diagnosis not present

## 2020-07-25 DIAGNOSIS — Z515 Encounter for palliative care: Secondary | ICD-10-CM

## 2020-07-25 DIAGNOSIS — R29818 Other symptoms and signs involving the nervous system: Secondary | ICD-10-CM | POA: Diagnosis not present

## 2020-07-25 DIAGNOSIS — I672 Cerebral atherosclerosis: Secondary | ICD-10-CM | POA: Diagnosis present

## 2020-07-25 DIAGNOSIS — F0281 Dementia in other diseases classified elsewhere with behavioral disturbance: Secondary | ICD-10-CM | POA: Diagnosis not present

## 2020-07-25 DIAGNOSIS — F0280 Dementia in other diseases classified elsewhere without behavioral disturbance: Secondary | ICD-10-CM | POA: Diagnosis present

## 2020-07-25 DIAGNOSIS — K8020 Calculus of gallbladder without cholecystitis without obstruction: Secondary | ICD-10-CM | POA: Diagnosis not present

## 2020-07-25 DIAGNOSIS — Z7951 Long term (current) use of inhaled steroids: Secondary | ICD-10-CM | POA: Diagnosis not present

## 2020-07-25 DIAGNOSIS — G20 Parkinson's disease: Secondary | ICD-10-CM | POA: Diagnosis not present

## 2020-07-25 DIAGNOSIS — R410 Disorientation, unspecified: Secondary | ICD-10-CM | POA: Diagnosis not present

## 2020-07-25 DIAGNOSIS — F0390 Unspecified dementia without behavioral disturbance: Secondary | ICD-10-CM | POA: Diagnosis not present

## 2020-07-25 DIAGNOSIS — I10 Essential (primary) hypertension: Secondary | ICD-10-CM | POA: Diagnosis not present

## 2020-07-25 DIAGNOSIS — Z6821 Body mass index (BMI) 21.0-21.9, adult: Secondary | ICD-10-CM

## 2020-07-25 DIAGNOSIS — R4781 Slurred speech: Secondary | ICD-10-CM | POA: Diagnosis not present

## 2020-07-25 DIAGNOSIS — I679 Cerebrovascular disease, unspecified: Secondary | ICD-10-CM | POA: Diagnosis not present

## 2020-07-25 DIAGNOSIS — R21 Rash and other nonspecific skin eruption: Secondary | ICD-10-CM | POA: Diagnosis not present

## 2020-07-25 DIAGNOSIS — I998 Other disorder of circulatory system: Secondary | ICD-10-CM | POA: Diagnosis not present

## 2020-07-25 DIAGNOSIS — Z7189 Other specified counseling: Secondary | ICD-10-CM | POA: Diagnosis not present

## 2020-07-25 DIAGNOSIS — R451 Restlessness and agitation: Secondary | ICD-10-CM | POA: Diagnosis not present

## 2020-07-25 DIAGNOSIS — R404 Transient alteration of awareness: Secondary | ICD-10-CM | POA: Diagnosis not present

## 2020-07-25 HISTORY — DX: Cerebral infarction, unspecified: I63.9

## 2020-07-25 NOTE — ED Provider Notes (Signed)
[facility] Emergency Department Provider Note MRN:  [id number]  Arrival date & time: 07/25/20     Chief Complaint   Cerebrovascular Accident   History of Present Illness   Jeffrey Blair is a 86 y.o. year-old male with a history of dementia, Parkinson's presenting to the ED with chief complaint of stroke.  Patient was recently admitted at the [facility] for stroke but family brought him home [facility] because they were waiting for a long time in the ED to get a bed upstairs.  Once they got home, patient did not recognize the home.  This concerned family and they decided to bring patient back to the hospital for admission.  They chose a different hospital.  Currently patient is confused compared to his baseline.  No focal numbness or weakness.  Complaining of headache.  Review of Systems  A complete 10 system review of systems was obtained and all systems are negative except as noted in the HPI and PMH.   Patient's Health History    Past Medical History:  Diagnosis Date  . Cancer ([place])    prostate-in remission  . Cerebrovascular disease 01/25/2016  . Cervical disc disease 01/25/2016  . Dementia ([place])   . Migraine   . Parkinson disease ([place])   . Stroke ([place])   . TIA (transient ischemic attack) 01/24/2016    History reviewed. No pertinent surgical history.  Family History  Problem Relation Age of Onset  . Allergies Child   . Cancer Father        lung cancer  . Cancer Brother        colon  . Cancer Sister     Social History   Socioeconomic History  . Marital status: Married    Spouse name: Not on file  . Number of children: Not on file  . Years of education: Not on file  . Highest education level: Not on file  Occupational History  . Not on file  Tobacco Use  . Smoking status: Former Smoker    Packs/day: 0.50    Years: 20.00    Pack years: 10.00    Types: Cigarettes    Quit date: 04/07/1985    Years since quitting:  35.3  . Smokeless tobacco: Never Used  Substance and Sexual Activity  . Alcohol use: No    Alcohol/week: 0.0 standard drinks  . Drug use: No  . Sexual activity: Not Currently  Other Topics Concern  . Not on file  Social History Narrative  . Not on file   Social Determinants of Health   Financial Resource Strain: Not on file  Food Insecurity: Not on file  Transportation Needs: Not on file  Physical Activity: Not on file  Stress: Not on file  Social Connections: Not on file  Intimate Partner Violence: Not on file     Physical Exam   Vitals:   07/25/20 1926 07/25/20 2200  BP: 139/80 (!) 141/76  Pulse: 90 86  Resp: 16 17  Temp: 98.5 F (36.9 C)   SpO2: 97% 94%    CONSTITUTIONAL: Chronically ill-appearing, NAD NEURO: Confused, disoriented, moves all extremities EYES:  eyes equal and reactive ENT/NECK:  no LAD, no JVD CARDIO: Regular rate, well-perfused, normal S1 and S2 PULM:  CTAB no wheezing or rhonchi GI/GU:  normal bowel sounds, non-distended, non-tender MSK/SPINE:  No gross deformities, no edema SKIN:  no rash, atraumatic PSYCH:  Appropriate speech and behavior  *Additional and/or pertinent findings included in MDM below  Diagnostic and Interventional Summary    EKG Interpretation  Date/Time:    Ventricular Rate:    PR Interval:    QRS Duration:   QT Interval:    QTC Calculation:   R Axis:     Text Interpretation:        Labs Reviewed  RESP PANEL BY RT-PCR (FLU A&B, COVID) ARPGX2  ETHANOL  PROTIME-INR  APTT  CBC  DIFFERENTIAL  COMPREHENSIVE METABOLIC PANEL  RAPID URINE DRUG SCREEN, HOSP PERFORMED  URINALYSIS, ROUTINE W REFLEX MICROSCOPIC  I-STAT CHEM 8, ED    CT HEAD WO CONTRAST    (Results Pending)    Medications - No data to display   Procedures  /  Critical Care .Critical Care Performed by: Maudie Flakes, MD Authorized by: Maudie Flakes, MD   Critical care provider statement:    Critical care time (minutes):  31   Critical  care was necessary to treat or prevent imminent or life-threatening deterioration of the following conditions: Acute ischemic stroke.   Critical care was time spent personally by me on the following activities:  Discussions with consultants, evaluation of patient's response to treatment, examination of patient, ordering and performing treatments and interventions, ordering and review of laboratory studies, ordering and review of radiographic studies, pulse oximetry, re-evaluation of patient's condition, obtaining history from patient or surrogate and review of old charts    ED Course and Medical Decision Making  I have reviewed the triage vital signs, the nursing notes, and pertinent available records from the EMR.  Listed above are laboratory and imaging tests that I personally ordered, reviewed, and interpreted and then considered in my medical decision making (see below for details).  Acute ischemic stroke found at outside hospital, now desiring admission for stroke work-up after leaving AMA at other facility.  Given the worsening headache will obtain CT to exclude worsening or bleeding.  Will need admission.  Awaiting labs and repeat head CT, signed out to oncoming provider at shift change.       Barth Kirks. Sedonia Small, MD [place] [email or web address]  Final Clinical Impressions(s) / ED Diagnoses     ICD-10-CM   1. Acute ischemic stroke [facility])  I63.9     ED Discharge Orders    None       Discharge Instructions Discussed with and Provided to Patient:   Discharge Instructions   None       Maudie Flakes, MD 07/25/20 2332

## 2020-07-25 NOTE — ED Triage Notes (Signed)
Son brought pt here due to pt getting agitated at [facility] and no rooms were available, explained to son that was the case here as well. Son stated he felt more comfortable here.  Pt was to be admitted for CVA.  Pt noted to drool some while taking oral temp.  Pt was able to turn his head and he would slide down in his chair and not acting as usual self.

## 2020-07-26 ENCOUNTER — Encounter (HOSPITAL_COMMUNITY): Payer: Self-pay | Admitting: Internal Medicine

## 2020-07-26 ENCOUNTER — Observation Stay (HOSPITAL_COMMUNITY): Payer: PPO

## 2020-07-26 ENCOUNTER — Emergency Department (HOSPITAL_COMMUNITY): Payer: PPO

## 2020-07-26 DIAGNOSIS — R6520 Severe sepsis without septic shock: Secondary | ICD-10-CM | POA: Diagnosis not present

## 2020-07-26 DIAGNOSIS — E871 Hypo-osmolality and hyponatremia: Secondary | ICD-10-CM | POA: Diagnosis not present

## 2020-07-26 DIAGNOSIS — E43 Unspecified severe protein-calorie malnutrition: Secondary | ICD-10-CM | POA: Diagnosis not present

## 2020-07-26 DIAGNOSIS — N179 Acute kidney failure, unspecified: Secondary | ICD-10-CM | POA: Diagnosis not present

## 2020-07-26 DIAGNOSIS — I361 Nonrheumatic tricuspid (valve) insufficiency: Secondary | ICD-10-CM

## 2020-07-26 DIAGNOSIS — D649 Anemia, unspecified: Secondary | ICD-10-CM | POA: Diagnosis present

## 2020-07-26 DIAGNOSIS — Z66 Do not resuscitate: Secondary | ICD-10-CM | POA: Diagnosis not present

## 2020-07-26 DIAGNOSIS — R471 Dysarthria and anarthria: Secondary | ICD-10-CM | POA: Diagnosis not present

## 2020-07-26 DIAGNOSIS — Z8673 Personal history of transient ischemic attack (TIA), and cerebral infarction without residual deficits: Secondary | ICD-10-CM | POA: Diagnosis not present

## 2020-07-26 DIAGNOSIS — Z801 Family history of malignant neoplasm of trachea, bronchus and lung: Secondary | ICD-10-CM | POA: Diagnosis not present

## 2020-07-26 DIAGNOSIS — G459 Transient cerebral ischemic attack, unspecified: Secondary | ICD-10-CM | POA: Diagnosis present

## 2020-07-26 DIAGNOSIS — I639 Cerebral infarction, unspecified: Secondary | ICD-10-CM | POA: Diagnosis present

## 2020-07-26 DIAGNOSIS — R4701 Aphasia: Secondary | ICD-10-CM | POA: Diagnosis not present

## 2020-07-26 DIAGNOSIS — I771 Stricture of artery: Secondary | ICD-10-CM | POA: Diagnosis not present

## 2020-07-26 DIAGNOSIS — N390 Urinary tract infection, site not specified: Secondary | ICD-10-CM | POA: Diagnosis not present

## 2020-07-26 DIAGNOSIS — Z515 Encounter for palliative care: Secondary | ICD-10-CM | POA: Diagnosis not present

## 2020-07-26 DIAGNOSIS — I6389 Other cerebral infarction: Secondary | ICD-10-CM | POA: Diagnosis not present

## 2020-07-26 DIAGNOSIS — Z20822 Contact with and (suspected) exposure to covid-19: Secondary | ICD-10-CM | POA: Diagnosis not present

## 2020-07-26 DIAGNOSIS — I10 Essential (primary) hypertension: Secondary | ICD-10-CM | POA: Diagnosis present

## 2020-07-26 DIAGNOSIS — R29718 NIHSS score 18: Secondary | ICD-10-CM | POA: Diagnosis not present

## 2020-07-26 DIAGNOSIS — G9341 Metabolic encephalopathy: Secondary | ICD-10-CM | POA: Diagnosis not present

## 2020-07-26 DIAGNOSIS — I63511 Cerebral infarction due to unspecified occlusion or stenosis of right middle cerebral artery: Secondary | ICD-10-CM | POA: Diagnosis not present

## 2020-07-26 DIAGNOSIS — Z87891 Personal history of nicotine dependence: Secondary | ICD-10-CM | POA: Diagnosis not present

## 2020-07-26 DIAGNOSIS — G8104 Flaccid hemiplegia affecting left nondominant side: Secondary | ICD-10-CM | POA: Diagnosis not present

## 2020-07-26 DIAGNOSIS — I6523 Occlusion and stenosis of bilateral carotid arteries: Secondary | ICD-10-CM | POA: Diagnosis not present

## 2020-07-26 DIAGNOSIS — R29810 Facial weakness: Secondary | ICD-10-CM | POA: Diagnosis not present

## 2020-07-26 DIAGNOSIS — A4151 Sepsis due to Escherichia coli [E. coli]: Secondary | ICD-10-CM | POA: Diagnosis not present

## 2020-07-26 DIAGNOSIS — F0280 Dementia in other diseases classified elsewhere without behavioral disturbance: Secondary | ICD-10-CM | POA: Diagnosis not present

## 2020-07-26 DIAGNOSIS — G20 Parkinson's disease: Secondary | ICD-10-CM | POA: Diagnosis not present

## 2020-07-26 DIAGNOSIS — Z8546 Personal history of malignant neoplasm of prostate: Secondary | ICD-10-CM | POA: Diagnosis not present

## 2020-07-26 DIAGNOSIS — G43909 Migraine, unspecified, not intractable, without status migrainosus: Secondary | ICD-10-CM | POA: Diagnosis not present

## 2020-07-26 DIAGNOSIS — G936 Cerebral edema: Secondary | ICD-10-CM | POA: Diagnosis not present

## 2020-07-26 LAB — CBC
HCT: 38.4 % — ABNORMAL LOW (ref 39.0–52.0)
Hemoglobin: 12.2 g/dL — ABNORMAL LOW (ref 13.0–17.0)
MCH: 29.7 pg (ref 26.0–34.0)
MCHC: 31.8 g/dL (ref 30.0–36.0)
MCV: 93.4 fL (ref 80.0–100.0)
Platelets: 231 10*3/uL (ref 150–400)
RBC: 4.11 MIL/uL — ABNORMAL LOW (ref 4.22–5.81)
RDW: 13.7 % (ref 11.5–15.5)
WBC: 7.8 10*3/uL (ref 4.0–10.5)
nRBC: 0 % (ref 0.0–0.2)

## 2020-07-26 LAB — COMPREHENSIVE METABOLIC PANEL
ALT: 9 U/L (ref 0–44)
AST: 25 U/L (ref 15–41)
Albumin: 4.2 g/dL (ref 3.5–5.0)
Alkaline Phosphatase: 58 U/L (ref 38–126)
Anion gap: 10 (ref 5–15)
BUN: 15 mg/dL (ref 8–23)
CO2: 29 mmol/L (ref 22–32)
Calcium: 9.2 mg/dL (ref 8.9–10.3)
Chloride: 96 mmol/L — ABNORMAL LOW (ref 98–111)
Creatinine, Ser: 0.72 mg/dL (ref 0.61–1.24)
GFR, Estimated: >60 mL/min (ref >60)
Glucose, Bld: 120 mg/dL — ABNORMAL HIGH (ref 70–99)
Potassium: 3.5 mmol/L (ref 3.5–5.1)
Sodium: 135 mmol/L (ref 135–145)
Total Bilirubin: 0.6 mg/dL (ref 0.3–1.2)
Total Protein: 8.1 g/dL (ref 6.5–8.1)

## 2020-07-26 LAB — RAPID URINE DRUG SCREEN, HOSP PERFORMED
Amphetamines: NOT DETECTED
Barbiturates: NOT DETECTED
Benzodiazepines: NOT DETECTED
Cocaine: NOT DETECTED
Opiates: NOT DETECTED
Tetrahydrocannabinol: NOT DETECTED

## 2020-07-26 LAB — RESP PANEL BY RT-PCR (FLU A&B, COVID) ARPGX2
Influenza A by PCR: NEGATIVE
Influenza B by PCR: NEGATIVE
SARS Coronavirus 2 by RT PCR: NEGATIVE

## 2020-07-26 LAB — APTT: aPTT: 30 seconds (ref 24–36)

## 2020-07-26 LAB — ECHOCARDIOGRAM COMPLETE BUBBLE STUDY
Area-P 1/2: 3.24 cm2
S' Lateral: 2.3 cm

## 2020-07-26 LAB — URINALYSIS, ROUTINE W REFLEX MICROSCOPIC
Bilirubin Urine: NEGATIVE
Glucose, UA: NEGATIVE mg/dL
Hgb urine dipstick: NEGATIVE
Ketones, ur: 5 mg/dL — AB
Leukocytes,Ua: NEGATIVE
Nitrite: NEGATIVE
Protein, ur: NEGATIVE mg/dL
Specific Gravity, Urine: 1.04 — ABNORMAL HIGH (ref 1.005–1.030)
pH: 7 (ref 5.0–8.0)

## 2020-07-26 LAB — DIFFERENTIAL
Abs Immature Granulocytes: 0.01 10*3/uL (ref 0.00–0.07)
Basophils Absolute: 0 10*3/uL (ref 0.0–0.1)
Basophils Relative: 0 %
Eosinophils Absolute: 0.1 10*3/uL (ref 0.0–0.5)
Eosinophils Relative: 1 %
Immature Granulocytes: 0 %
Lymphocytes Relative: 18 %
Lymphs Abs: 1.4 10*3/uL (ref 0.7–4.0)
Monocytes Absolute: 0.7 10*3/uL (ref 0.1–1.0)
Monocytes Relative: 9 %
Neutro Abs: 5.5 10*3/uL (ref 1.7–7.7)
Neutrophils Relative %: 72 %

## 2020-07-26 LAB — PROTIME-INR
INR: 1 (ref 0.8–1.2)
Prothrombin Time: 12.8 seconds (ref 11.4–15.2)

## 2020-07-26 LAB — ETHANOL: Alcohol, Ethyl (B): <10 mg/dL (ref <10)

## 2020-07-26 MED ORDER — ASPIRIN EC 81 MG PO TBEC
81.0000 mg | DELAYED_RELEASE_TABLET | Freq: Every day | ORAL | Status: DC
  Administered 2020-07-28 – 2020-07-31 (×4): 81 mg via ORAL
  Filled 2020-07-26 (×4): qty 1

## 2020-07-26 MED ORDER — VENLAFAXINE HCL 37.5 MG PO TABS
37.5000 mg | ORAL_TABLET | Freq: Two times a day (BID) | ORAL | Status: DC
  Administered 2020-07-28 – 2020-08-09 (×23): 37.5 mg via ORAL
  Filled 2020-07-26 (×29): qty 1

## 2020-07-26 MED ORDER — TIOTROPIUM BROMIDE MONOHYDRATE 18 MCG IN CAPS: 18.0000 ug | ORAL_CAPSULE | Freq: Every day | RESPIRATORY_TRACT | Status: DC

## 2020-07-26 MED ORDER — ACETAMINOPHEN 650 MG RE SUPP
650.0000 mg | Freq: Four times a day (QID) | RECTAL | Status: DC | PRN
  Administered 2020-07-27 – 2020-08-25 (×8): 650 mg via RECTAL
  Filled 2020-07-26 (×9): qty 1

## 2020-07-26 MED ORDER — CARBIDOPA-LEVODOPA 25-100 MG PO TABS
3.0000 | ORAL_TABLET | Freq: Two times a day (BID) | ORAL | Status: DC
  Administered 2020-07-28 – 2020-08-25 (×38): 3 via ORAL
  Filled 2020-07-26 (×49): qty 3

## 2020-07-26 MED ORDER — POLYVINYL ALCOHOL 1.4 % OP SOLN
1.0000 [drp] | Freq: Four times a day (QID) | OPHTHALMIC | Status: DC | PRN
  Filled 2020-07-26: qty 15

## 2020-07-26 MED ORDER — PANTOPRAZOLE SODIUM 40 MG PO TBEC
40.0000 mg | DELAYED_RELEASE_TABLET | Freq: Every day | ORAL | Status: DC
  Administered 2020-07-28 – 2020-08-08 (×12): 40 mg via ORAL
  Filled 2020-07-26 (×13): qty 1

## 2020-07-26 MED ORDER — MECLIZINE HCL 12.5 MG PO TABS
25.0000 mg | ORAL_TABLET | Freq: Three times a day (TID) | ORAL | Status: DC | PRN
  Administered 2020-08-03 – 2020-08-04 (×2): 25 mg via ORAL
  Filled 2020-07-26 (×2): qty 2

## 2020-07-26 MED ORDER — STROKE: EARLY STAGES OF RECOVERY BOOK
Freq: Once | Status: AC
  Filled 2020-07-26 (×2): qty 1

## 2020-07-26 MED ORDER — TRAMADOL HCL 50 MG PO TABS: 50.0000 mg | ORAL_TABLET | Freq: Three times a day (TID) | ORAL | Status: DC | PRN

## 2020-07-26 MED ORDER — LORAZEPAM 2 MG/ML IJ SOLN
0.5000 mg | Freq: Once | INTRAMUSCULAR | Status: AC | PRN
  Administered 2020-07-26: 15:00:00 0.5 mg via INTRAVENOUS
  Filled 2020-07-26: qty 1

## 2020-07-26 MED ORDER — ACETAMINOPHEN 325 MG PO TABS
650.0000 mg | ORAL_TABLET | Freq: Four times a day (QID) | ORAL | Status: DC | PRN
  Administered 2020-07-29 – 2020-08-17 (×14): 650 mg via ORAL
  Filled 2020-07-26 (×15): qty 2

## 2020-07-26 MED ORDER — ONDANSETRON HCL 4 MG/2ML IJ SOLN
4.0000 mg | Freq: Four times a day (QID) | INTRAMUSCULAR | Status: DC | PRN
  Administered 2020-08-02 – 2020-08-15 (×8): 4 mg via INTRAVENOUS
  Filled 2020-07-26 (×9): qty 2

## 2020-07-26 MED ORDER — LORATADINE 10 MG PO TABS
10.0000 mg | ORAL_TABLET | Freq: Every day | ORAL | Status: DC | PRN
  Administered 2020-08-02 – 2020-08-03 (×2): 10 mg via ORAL
  Filled 2020-07-26 (×3): qty 1

## 2020-07-26 MED ORDER — CARBOXYMETHYLCELLULOSE SODIUM 0.5 % OP SOLN: 1.0000 [drp] | Freq: Four times a day (QID) | OPHTHALMIC | Status: DC | PRN

## 2020-07-26 MED ORDER — POTASSIUM CHLORIDE IN NACL 20-0.9 MEQ/L-% IV SOLN
INTRAVENOUS | Status: DC
  Filled 2020-07-26 (×4): qty 1000

## 2020-07-26 MED ORDER — ONDANSETRON HCL 4 MG PO TABS
4.0000 mg | ORAL_TABLET | Freq: Four times a day (QID) | ORAL | Status: DC | PRN
  Administered 2020-08-04: 4 mg via ORAL
  Filled 2020-07-26: qty 1

## 2020-07-26 MED ORDER — LORAZEPAM 2 MG/ML IJ SOLN
1.0000 mg | Freq: Once | INTRAMUSCULAR | Status: AC
  Administered 2020-07-26: 1 mg via INTRAVENOUS
  Filled 2020-07-26: qty 1

## 2020-07-26 MED ORDER — ALBUTEROL SULFATE HFA 108 (90 BASE) MCG/ACT IN AERS
2.0000 | INHALATION_SPRAY | Freq: Four times a day (QID) | RESPIRATORY_TRACT | Status: DC | PRN
  Filled 2020-07-26: qty 6.7

## 2020-07-26 MED ORDER — SIMVASTATIN 20 MG PO TABS
20.0000 mg | ORAL_TABLET | Freq: Every evening | ORAL | Status: DC
  Administered 2020-07-28: 19:00:00 20 mg via ORAL
  Filled 2020-07-26: qty 1

## 2020-07-26 MED ORDER — LABETALOL HCL 5 MG/ML IV SOLN: 10.0000 mg | INTRAVENOUS | Status: DC | PRN

## 2020-07-26 MED ORDER — PRIMIDONE 50 MG PO TABS
150.0000 mg | ORAL_TABLET | Freq: Two times a day (BID) | ORAL | Status: DC
  Administered 2020-07-28 – 2020-08-24 (×34): 150 mg via ORAL
  Filled 2020-07-26 (×64): qty 3

## 2020-07-26 MED ORDER — CYANOCOBALAMIN 500 MCG PO TABS
1000.0000 ug | ORAL_TABLET | Freq: Every day | ORAL | Status: DC
  Administered 2020-07-28 – 2020-08-09 (×12): 1000 ug via ORAL
  Filled 2020-07-26 (×18): qty 2

## 2020-07-26 MED ORDER — ENOXAPARIN SODIUM 40 MG/0.4ML ~~LOC~~ SOLN: 40.0000 mg | SUBCUTANEOUS | Status: DC

## 2020-07-26 MED ORDER — CLOPIDOGREL BISULFATE 75 MG PO TABS
75.0000 mg | ORAL_TABLET | Freq: Every day | ORAL | Status: DC
  Administered 2020-07-28 – 2020-08-09 (×12): 75 mg via ORAL
  Filled 2020-07-26 (×14): qty 1

## 2020-07-26 MED ORDER — UMECLIDINIUM BROMIDE 62.5 MCG/INH IN AEPB
1.0000 | INHALATION_SPRAY | Freq: Every day | RESPIRATORY_TRACT | Status: DC
  Administered 2020-07-29 – 2020-08-09 (×10): 1 via RESPIRATORY_TRACT
  Filled 2020-07-26 (×4): qty 7

## 2020-07-26 NOTE — Consult Note (Signed)
[facility] TeleNeurology Consult Services  Stat Consult  Date of Service:   07/26/2020 01:49:25  Diagnosis:     .  I63.89 - Cerebrovascular accident (CVA) due to other mechanism [facility])  Impression: 86 year old male with a history of dementia presents with symptoms of inability to sit up straight and slouching this morning. Last known well time is 8 PM last night. Patient was seen at outside facility and had a CTA head and neck that shows right M2/MCA posterior division branch occlusion. Patient left AMA and came back to [facility]. Patient received ativan secondary to being agitated. Prior to ativan, the only focal deficit that patient had on exam was left sided neglect. Exam is currently sedated. DDX admit for stroke work up, not a IR candidate secondary to outside window presentation, admit for stroke work up, start DAPT therapy, permissive hypertension.  CT HEAD: at other facility was normal.   Patient was seen at outside facility and had a CTA head and neck that shows right M2/MCA posterior division branch occlusion.  Our recommendations are outlined below.  Diagnostic Studies: Recommend MRI brain without contrast Transthoracic Echo with bubble study, if available  Laboratory Studies: Recommend Lipid panel Hemoglobin A1c  Medication: Initiate dual antiplatelet therapy with Aspirin 81 mg daily and Clopidogrel 75 mg daily Statins for LDL goal less than 70 Permissive hypertension, Antihypertensives with prn for first 24-48 hrs post stroke onset. If BP greater than 220/120 give Labetalol IV or Vasotec IV  Nursing Recommendations: Telemetry, IV Fluids, avoid dextrose containing fluids, Maintain euglycemia Neuro checks q4 hrs x 24 hrs and then per shift Head of bed 30 degrees  Consultations: Recommend Speech therapy if failed dysphagia screen Physical therapy/Occupational therapy  Disposition: Neurology will follow  Additional  Recommendations:    Metrics: [employer] Notification Time: 07/26/2020 01:47:56 Stamp Time: 07/26/2020 01:49:25 Callback Response Time: 07/26/2020 01:50:14   ----------------------------------------------------------------------------------------------------  Chief Complaint: Inability to sit up straight.  History of Present Illness: Patient is a 86 year old Male.  86 year old male with a history of dementia presents with symptoms of inability to sit up straight and slouching this morning. Last known well time is 8 PM last night. Patient was seen at outside facility and had a CTA head and neck that shows right M2/MCA posterior division branch occlusion. Patient left AMA and [facility]. Patient received ativan secondary to being agitated. Prior to ativan, the only focal deficit that patient had on exam was left sided neglect. Exam is currently sedated.      Examination: BP(160/80), Pulse(86), Blood Glucose(120) 1A: Level of Consciousness - Requires repeated stimulation to arouse + 2 1B: Ask Month and Age - Could Not Answer Either Question Correctly + 2 1C: Blink Eyes & Squeeze Hands - Performs 0 Tasks + 2 2: Test Horizontal Extraocular Movements - Normal + 0 3: Test Visual Fields - No Visual Loss + 0 4: Test Facial Palsy (Use Grimace if Obtunded) - Normal symmetry + 0 5A: Test Left Arm Motor Drift - Some Effort Against Gravity + 2 5B: Test Right Arm Motor Drift - Some Effort Against Gravity + 2 6A: Test Left Leg Motor Drift - No Effort Against Gravity + 3 6B: Test Right Leg Motor Drift - No Effort Against Gravity + 3 7: Test Limb Ataxia (FNF/Heel-Shin) - Does Not Understand + 0 8: Test Sensation - Normal; No sensory loss + 0 9: Test Language/Aphasia - Mild-Moderate Aphasia: Some Obvious Changes, Without Significant  Limitation + 1 10: Test Dysarthria - Normal + 0 11: Test Extinction/Inattention - Extinction to bilateral simultaneous stimulation +  1  NIHSS Score: 18   Patient / Family was informed the Neurology Consult would occur via TeleHealth consult by way of interactive audio and video telecommunications and consented to receiving care in this manner.  Patient is being evaluated for possible acute neurologic impairment and high probability of imminent or life - threatening deterioration.I spent total of 30 minutes providing care to this patient, including time for face to face visit via telemedicine, review of medical records, imaging studies and discussion of findings with providers, the patient and / or family.   Dr Jessica Priest   [facility] [phone]  Case [id number]

## 2020-07-26 NOTE — ED Notes (Addendum)
While walking passed room, this RN witnessed son (at bedside) attempting to keep patient from getting up out of the bed. Pt was attempting to get up to use the bathroom, but he had already soiled himself/bed. Linens were changed and new gown was provided. Pt was also placed in yellow non-skid footwear.This RN, with the assistance of another nurse and tech, attempted to reposition pt back into the bed but pt was adamant about "going downstairs to the bathroom." Pt's son asked [place] if we could attempt to get pt up and have him walk to the door so that he could see that he wasn't at home, and was in fact in the hospital. An attempt was made but pt was unsteady on his feet and was unable to take more than two steps. While standing, pt proceeded to urinate on himself and onto the floor. Pt was assisted back into the bed, cleaned and provided with a fresh gown and warm blankets. Pt was repositioned for comfort.   Pt currently laying in bed resting and appears to be in NAD. Charge RN was made aware that patient was becoming increasingly agitated and continually trying to get up out of the bed. D/t this, I informed the Charge RN that this patient would need a sitter. Son is at bedside attempting to rest now that pt is calm. Call bell within reach, will continue to monitor.

## 2020-07-26 NOTE — ED Notes (Signed)
[facility] arrived for transport 

## 2020-07-26 NOTE — H&P (Signed)
History and Physical    Jeffrey RoyalsWilliam J Blair ZOX:[id number] DOB: 04/23/1928 DOA: 07/25/2020  [facility]  [facility].  I have personally briefly reviewed patient's old medical [facility]  Chief Complaint: Agitation and stroke.  HPI: Jeffrey RoyalsWilliam J Blair is a 86 y.o. male with medical history significant of prostate cancer in remission, history of other nonhemorrhagic CVA, TIA, Parkinson's disease, migraine headaches, cervical disc disease, dementia who is brought to the emergency department [place] [facility] [place] [facility] after he was taken very due to AMS secondary to acute other nonhemorrhagic CVA.  The patient's son stated he was at his usual baseline on Friday, 07/24/2020.  He had dinner earlier in the evening and and went to bed around 2000.  Then, next day when he woke up he was having slurred speech and possibly a slight facial droop per patient's daughter and [facility] was [facility] where he was waiting for a bed in the emergency department to be admitted for acute CVA.  However, there were no available beds and the patient became agitated while waiting in the emergency department.  He was subsequently signed out AMA by family members and taken back home where family members noticed he was very confused, did not know where he was and they became concerned and [facility].  The story is taken from the patient's son as he is unable to provide any information at this time and is currently under sedation.  The patient son states that at baseline he recognizes family members and is able to carry on a conversation.  He is usually oriented to place.  Sometimes he is oriented to time and date.  ED Course: Initial vital signs were temperature 98.5 F, pulse 90, respirations 16, BP 139/80 mmHg O2 sat 97% on room air.  The patient received 1 mg of lorazepam IVP earlier.  Labwork: CBC showed a white count of 7.8,  hemoglobin 12.2 g/dL and platelets [id number].  PT 12.8, INR 1.0 PTT 30.  CMP showed chloride of 96 mmol/L and a glucose of 120 mg/dL.  All other values were within expected range.  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  There was occlusion of the bilateral vertebral arteries at the origin with reconstitution at the distal V2 segment by muscular branches and reocclusion intracranially.  There was intracranial atherosclerosis with severe stenosis of the proximal basilar artery and P2 segment of the right PCA.  There was no hemodynamically significant stenosis in the neck.  Review of Systems: As per HPI otherwise all other systems reviewed and are negative.  Past Medical History:  Diagnosis Date  . Cancer (HCC)    prostate-in remission  . Cerebrovascular disease 01/25/2016  . Cervical disc disease 01/25/2016  . Dementia (HCC)   . Migraine   . Parkinson disease (HCC)   . Stroke (HCC)   . TIA (transient ischemic attack) 01/24/2016   History reviewed. No pertinent surgical history.  Social History  reports that he quit smoking about 35 years ago. His smoking use included cigarettes. He has a 10.00 pack-year smoking history. He has never used smokeless tobacco. He reports that he does not drink alcohol and does not use drugs.  Allergies  Allergen Reactions  . Morphine Anaphylaxis, Shortness Of Breath and Other (See Comments)    Altered mental status; takes  tramadol at home  . Penicillins Other (See Comments)    Has patient had a PCN reaction causing immediate rash, facial/tongue/throat swelling, SOB or lightheadedness with hypotension: unknown Has patient had a PCN reaction causing severe rash involving mucus membranes or skin necrosis: unknown Has patient had a PCN reaction that required hospitalization; unknown Has patient had a PCN reaction occurring within the  last 10 years:unknown If all of the above answers are "NO", then may proceed with Cephalosporin use.  Unknown reaction   Family History  Problem Relation Age of Onset  . Allergies Child   . Cancer Father        lung cancer  . Cancer Brother        colon  . Cancer Sister    Prior to Admission medications   Medication Sig Start Date End Date Taking? Authorizing Provider  acetaminophen (TYLENOL) 500 MG tablet Take 500 mg by mouth every 6 (six) hours as needed.   Yes [provider]  aspirin 81 MG chewable tablet Chew 1 tablet (81 mg total) by mouth daily. 01/25/16  Yes Elliot Cousin, MD  carbidopa-levodopa (SINEMET IR) 25-100 MG per tablet Take 3 tablets by mouth in the morning and at bedtime.   Yes [provider]  diltiazem (DILACOR XR) 120 MG 24 hr capsule Take 120 mg by mouth daily.   Yes [provider]  loratadine (CLARITIN) 10 MG tablet Take 10 mg by mouth daily as needed for allergies.   Yes [provider]  meclizine (ANTIVERT) 25 MG tablet Take 25 mg by mouth 3 (three) times daily as needed for dizziness.   Yes [provider]  omeprazole (PRILOSEC) 20 MG capsule Take 20 mg by mouth daily.   Yes [provider]  primidone (MYSOLINE) 50 MG tablet Take 150 mg by mouth 2 (two) times daily.   Yes [provider]  propranolol (INDERAL) 20 MG tablet Take 10 mg by mouth in the morning and at bedtime.   Yes [provider]  simvastatin (ZOCOR) 20 MG tablet Take 1 tablet (20 mg total) by mouth every evening. 01/25/16  Yes Elliot Cousin, MD  tiotropium (SPIRIVA) 18 MCG inhalation capsule Place 18 mcg into inhaler and inhale daily.   Yes [provider]  Tiotropium Bromide-Olodaterol (STIOLTO RESPIMAT) 2.5-2.5 MCG/ACT AERS Inhale 2 puffs into the lungs daily.   Yes [provider]  traMADol (ULTRAM) 50 MG tablet Take 50 mg by mouth 3 (three) times daily as needed for moderate pain.    Yes [provider]  venlafaxine (EFFEXOR) 37.5 MG tablet Take 37.5 mg by mouth 2 (two) times daily.   Yes [provider]  vitamin B-12 (CYANOCOBALAMIN) 500 MCG tablet Take 1,000 mcg by mouth daily.    Yes [provider]  albuterol (PROVENTIL HFA;VENTOLIN HFA) 108 (90 Base) MCG/ACT inhaler Inhale 2 puffs into the lungs every 6 (six) hours as needed for wheezing or shortness of breath. 01/11/16   Lupita Leash, MD  albuterol (PROVENTIL) (2.5 MG/3ML) 0.083% nebulizer solution Take 3 mLs (2.5 mg total) by nebulization every 6 (six) hours as needed for wheezing or shortness of breath. Patient not taking: No sig reported 01/11/16   Lupita Leash, MD  Calcium Carbonate-Vitamin D 600-400 MG-UNIT tablet Take 1 tablet by mouth 2 (two) times daily.    [provider]  carboxymethylcellulose (REFRESH PLUS) 0.5 % SOLN Place 1 drop into both eyes 4 (four) times daily as needed (dry eyes).  [provider]    Physical Exam: Vitals:   07/26/20 0030 07/26/20 0130 07/26/20 0200 07/26/20 0230  BP: (!) 153/97 (!) 160/80 129/81 129/78  Pulse: 99 86 88 88  Resp: 17 18 16 13   Temp:      TempSrc:      SpO2: 98% 95% 96% 97%  Weight:      Height:        Constitutional: NAD, calm, comfortable Eyes: PERRL, lids and conjunctivae normal ENMT: Mucous membranes are mildly dry. Posterior pharynx clear of any exudate or lesions. Neck: normal, supple, no masses, no thyromegaly Respiratory: clear to auscultation bilaterally, no wheezing, no crackles. Normal respiratory effort. No accessory muscle use.  Cardiovascular: Regular rate and rhythm, no murmurs / rubs / gallops. No extremity edema. 2+ pedal pulses. No carotid bruits.  Abdomen: Soft, no tenderness, no masses palpated. No hepatosplenomegaly. Bowel sounds positive.  Musculoskeletal: no clubbing / cyanosis.  Good ROM, no contractures. Normal muscle tone.  Skin: no rashes, lesions, ulcers on very limited dermatological  examination. Neurologic: Sedated.  Unable to fully evaluate. Psychiatric: Sedated..    Labs on Admission: I have personally reviewed following labs and imaging studies  CBC: Recent Labs  Lab 07/26/20 0021  WBC 7.8  NEUTROABS 5.5  HGB 12.2*  HCT 38.4*  MCV 93.4  PLT [id number]    Basic Metabolic Panel: Recent Labs  Lab 07/26/20 0021  NA 135  K 3.5  CL 96*  CO2 29  GLUCOSE 120*  BUN 15  CREATININE 0.72  CALCIUM 9.2    GFR: Estimated Creatinine Clearance: 63 mL/min (by C-G formula based on SCr of 0.72 mg/dL).  Liver Function Tests: Recent Labs  Lab 07/26/20 0021  AST 25  ALT 9  ALKPHOS 58  BILITOT 0.6  PROT 8.1  ALBUMIN 4.2    Radiological Exams on Admission: No results found.  EKG: Independently reviewed.   Assessment/Plan Principal Problem:   Cerebrovascular accident (CVA) due to other mechanism [facility]) Their preference is to stay at [facility]. Teleneurology also suggested this. Observation/telemetry. Frequent neuro checks. Swallow screen. PT/OT/SLP. Check fasting lipids. Check hemoglobin A1c. Obtain MRI of brain when available. Consult neurology tomorrow..  Active Problems:   COPD (chronic obstructive pulmonary disease) (HCC) No signs of decompensation at this time. Continue home inhalers. Supplemental oxygen and nebulized bronchodilators PRN.    Parkinson disease ([place]) Continue primidone 150 mg p.o. twice daily per Continue Sinemet 25-100 mg 2 tablets p.o. twice daily. Supportive care.    Hyperlipidemia Check fasting lipids. Continue simvastatin 20 mg p.o. daily.    Hypertension Hold daily antihypertensives. Allow permissive hypertension up to 220/177mmHg.    Normocytic anemia Monitor H&H.   DVT prophylaxis: SCDs. Code Status:   Full code. Family Communication: Disposition Plan:   Patient is from:  Home.  Anticipated DC to:  TBD.  Anticipated DC date:  07/27/2020.  Anticipated DC barriers: Clinical status and work-up.  Consults  called:  Teleneurology (see consult note). Admission status:  Observation/telemetry.  Severity of Illness: High due to acute CVA with restlessness and AMS.  Patient will need to remain in the hospital for close monitoring, work-up and neurology evaluation.   Reubin Milan MD [facility]  How to contact the [facility] Attending or Consulting provider [place] or covering provider during after hours [place], for this patient?   1. Check the care team in [facility] and look for a) attending/consulting [facility] provider listed and b) the [facility] team listed 2. Log into  [email or web address] and use [place] universal password to access. If you do not have the password, please contact the hospital operator. 3. Locate the [facility] provider you are looking for under [facility] and page to a number that you can be directly reached. 4. If you still have difficulty reaching the provider, please page the [facility] (Director on Call) for the Hospitalists listed on amion for assistance.  07/26/2020, 3:52 AM   This document was prepared using Dragon voice recognition software and may contain some unintended transcription errors.

## 2020-07-26 NOTE — ED Notes (Signed)
PT in to evaluate pt. Son in room

## 2020-07-26 NOTE — ED Notes (Signed)
Pt becoming increasingly agitated. Pt attempting to stand and walk to the bathroom. Pt was informed multiple times that we did not want him standing and walking because he might fall. Pt unable to use urinal at this time and an attempt was made to pivot pt onto bedside commode however pt continued to lock his knees and resist being placed on commode.  Pt was assisted back into bed and tucked in. Pt currently resting and appears to be in NAD. Call bell within reach, bed in low position. Son still present at bedside. Will continue to monitor.

## 2020-07-26 NOTE — ED Notes (Addendum)
This RN assisted Neurologist to complete tele-stroke assessment.   Pt lethargic d/t Ativan and unable to complete parts of exam.

## 2020-07-26 NOTE — ED Notes (Signed)
ED TO INPATIENT HANDOFF REPORT  ED Nurse Name and Phone #: Lucile Hillmann [phone]  S Name/Age/Gender Jeffrey Blair 86 y.o. male Room/Bed: APA05/APA05  Code Status   Code Status: Full Code  Home/SNF/Other Home Patient oriented to: self Is this baseline? No   Triage Complete: Triage complete  Chief Complaint Cerebrovascular accident (CVA) due to other mechanism [facility]) [I63.89] Acute CVA (cerebrovascular accident) [facility]) [I63.9]  Triage Note Son brought pt here due to pt getting agitated at [facility] and no rooms were available, explained to son that was the case here as well. Son stated he felt more comfortable here.  Pt was to be admitted for CVA.  Pt noted to drool some while taking oral temp.  Pt was able to turn his head and he would slide down in his chair and not acting as usual self.     Allergies Allergies  Allergen Reactions  . Morphine Anaphylaxis, Shortness Of Breath and Other (See Comments)    Altered mental status; takes tramadol at home  . Penicillins Other (See Comments)    Has patient had a PCN reaction causing immediate rash, facial/tongue/throat swelling, SOB or lightheadedness with hypotension: unknown Has patient had a PCN reaction causing severe rash involving mucus membranes or skin necrosis: unknown Has patient had a PCN reaction that required hospitalization; unknown Has patient had a PCN reaction occurring within the last 10 years:unknown If all of the above answers are "NO", then may proceed with Cephalosporin use.  Unknown reaction    Level of Care/Admitting Diagnosis ED Disposition    ED Disposition Condition Comment   Admit  Hospital Area: [facility] [[id number]]  Level of Care: Telemetry Medical [104]  I expect the patient will be discharged within 24 hours: Yes  LOW acuity---Tx typically complete <24 hrs---ACUTE conditions typically can be evaluated <24 hours---LABS likely to return to acceptable levels <24 hours---IS near  functional baseline---EXPECTED to return to current living arrangement---NOT newly hypoxic: Meets criteria for 5C-Observation unit  Covid Evaluation: Asymptomatic Screening Protocol (No Symptoms)  Diagn[facility]: [id number]  Admitting Physician: Amalga, [place]  Attending Physician: Murlean Iba [[id number]]       B Medical/Surgery History Past Medical History:  Diagnosis Date  . Cancer ([place])    prostate-in remission  . Cerebrovascular disease 01/25/2016  . Cervical disc disease 01/25/2016  . Dementia ([place])   . Migraine   . Parkinson disease ([place])   . Stroke ([place])   . TIA (transient ischemic attack) 01/24/2016   History reviewed. No pertinent surgical history.   A IV Location/Drains/Wounds Patient Lines/Drains/Airways Status    Active Line/Drains/Airways    Name Placement date Placement time Site Days   Peripheral IV 07/25/20 Right Forearm 07/25/20  2240  Forearm  1          Intake/Output Last 24 hours No intake or output data in the 24 hours ending 07/26/20 1816  Labs/Imaging Results for orders placed or performed during the hospital encounter of 07/25/20 (from the past 48 hour(s))  Urine rapid drug screen (hosp performed)     Status: None   Collection Time: 07/25/20 10:45 PM  Result Value Ref Range   Opiates NONE DETECTED NONE DETECTED   Cocaine NONE DETECTED NONE DETECTED   Benzodiazepines NONE DETECTED NONE DETECTED   Amphetamines NONE DETECTED NONE DETECTED   Tetrahydrocannabinol NONE DETECTED NONE DETECTED   Barbiturates NONE DETECTED NONE DETECTED    Comment: (NOTE) DRUG SCREEN FOR MEDICAL PURPOSES ONLY.  IF CONFIRMATION IS NEEDED FOR ANY PURPOSE, NOTIFY LAB WITHIN 5 DAYS.  LOWEST DETECTABLE LIMITS FOR URINE DRUG SCREEN Drug Class                     Cutoff (ng/mL) Amphetamine and metabolites    1000 Barbiturate and metabolites    200 Benzodiazepine                 [id number] Tricyclics and metabolites      300 Opiates and metabolites        300 Cocaine and metabolites        300 THC                            50 Performed at [facility], [address], [place], [place] [address]   Urinalysis, Routine w reflex microscopic Urine, Clean Catch     Status: Abnormal   Collection Time: 07/25/20 10:45 PM  Result Value Ref Range   Color, Urine YELLOW YELLOW   APPearance CLEAR CLEAR   Specific Gravity, Urine 1.040 (H) 1.005 - 1.030   pH 7.0 5.0 - 8.0   Glucose, UA NEGATIVE NEGATIVE mg/dL   Hgb urine dipstick NEGATIVE NEGATIVE   Bilirubin Urine NEGATIVE NEGATIVE   Ketones, ur 5 (A) NEGATIVE mg/dL   Protein, ur NEGATIVE NEGATIVE mg/dL   Nitrite NEGATIVE NEGATIVE   Leukocytes,Ua NEGATIVE NEGATIVE    Comment: Performed at [facility], [address] [place],  [address]  Resp Panel by RT-PCR (Flu A&B, Covid) Nasopharyngeal Swab     Status: None   Collection Time: 07/25/20 11:10 PM   Specimen: Nasopharyngeal Swab; Nasopharyngeal(NP) swabs in vial transport medium  Result Value Ref Range   SARS Coronavirus 2 by RT PCR NEGATIVE NEGATIVE    Comment: (NOTE) SARS-CoV-2 target nucleic acids are NOT DETECTED.  The SARS-CoV-2 RNA is generally detectable in upper respiratory specimens during the acute phase of infection. The lowest concentration of SARS-CoV-2 viral copies this assay can detect is 138 copies/mL. A negative result does not preclude SARS-Cov-2 infection and should not be used as the sole basis for treatment or other patient management decisions. A negative result may occur with  improper specimen collection/handling, submission of specimen other than nasopharyngeal swab, presence of viral mutation(s) within the areas targeted by this assay, and inadequate number of viral copies(<138 copies/mL). A negative result must be combined with clinical observations, patient history, and epidemiological information. The expected result is Negative.  Fact Sheet for Patients:   EntrepreneurPulse.com.au  Fact Sheet for Healthcare Providers:  IncredibleEmployment.be  This test is no t yet approved or cleared by the [place] FDA and  has been authorized for detection and/or diagnosis of SARS-CoV-2 by FDA under an Emergency Use Authorization (EUA). This EUA will remain  in effect (meaning this test can be used) for the duration of the COVID-19 declaration under Section 564(b)(1) of the Act, 21 U.S.C.section 360bbb-3(b)(1), unless the authorization is terminated  or revoked sooner.       Influenza A by PCR NEGATIVE NEGATIVE   Influenza B by PCR NEGATIVE NEGATIVE    Comment: (NOTE) The Xpert Xpress SARS-CoV-2/FLU/RSV plus assay is intended as an aid in the diagnosis of influenza from Nasopharyngeal swab specimens and should not be used as a sole basis for treatment. Nasal washings and aspirates are unacceptable for Xpert Xpress SARS-CoV-2/FLU/RSV testing.  Fact Sheet for Patients: EntrepreneurPulse.com.au  Fact Sheet for Healthcare Providers:  IncredibleEmployment.be  This test is not yet approved or cleared by the [place] and has been authorized for detection and/or diagnosis of SARS-CoV-2 by FDA under an Emergency Use Authorization (EUA). This EUA will remain in effect (meaning this test can be used) for the duration of the COVID-19 declaration under Section 564(b)(1) of the Act, 21 U.S.C. section 360bbb-3(b)(1), unless the authorization is terminated or revoked.  Performed at [facility], [address] [place], [place] [address]   Ethanol     Status: None   Collection Time: 07/26/20 12:21 AM  Result Value Ref Range   Alcohol, Ethyl (B) <10 <10 mg/dL    Comment: (NOTE) Lowest detectable limit for serum alcohol is 10 mg/dL.  For medical purposes only. Performed at [facility], [address] [place], [place] [address]   Protime-INR     Status: None    Collection Time: 07/26/20 12:21 AM  Result Value Ref Range   Prothrombin Time 12.8 11.4 - 15.2 seconds   INR 1.0 0.8 - 1.2    Comment: (NOTE) INR goal varies based on device and disease states. Performed at [facility], [address] [place], [place] [address]   APTT     Status: None   Collection Time: 07/26/20 12:21 AM  Result Value Ref Range   aPTT 30 24 - 36 seconds    Comment: Performed at [facility], [address] [place], [place] [address]  CBC     Status: Abnormal   Collection Time: 07/26/20 12:21 AM  Result Value Ref Range   WBC 7.8 4.0 - 10.5 K/uL   RBC 4.11 (L) 4.22 - 5.81 MIL/uL   Hemoglobin 12.2 (L) 13.0 - 17.0 g/dL   HCT 38.4 (L) 39.0 - 52.0 %   MCV 93.4 80.0 - 100.0 fL   MCH 29.7 26.0 - 34.0 pg   MCHC 31.8 30.0 - 36.0 g/dL   RDW 13.7 11.5 - 15.5 %   Platelets 231 150 - 400 K/uL   nRBC 0.0 0.0 - 0.2 %    Comment: Performed at [facility], [address], [place], [place] [address]  Differential     Status: None   Collection Time: 07/26/20 12:21 AM  Result Value Ref Range   Neutrophils Relative % 72 %   Neutro Abs 5.5 1.7 - 7.7 K/uL   Lymphocytes Relative 18 %   Lymphs Abs 1.4 0.7 - 4.0 K/uL   Monocytes Relative 9 %   Monocytes Absolute 0.7 0.1 - 1.0 K/uL   Eosinophils Relative 1 %   Eosinophils Absolute 0.1 0.0 - 0.5 K/uL   Basophils Relative 0 %   Basophils Absolute 0.0 0.0 - 0.1 K/uL   Immature Granulocytes 0 %   Abs Immature Granulocytes 0.01 0.00 - 0.07 K/uL    Comment: Performed at [facility], [address], [place], [place] [address]  Comprehensive metabolic panel     Status: Abnormal   Collection Time: 07/26/20 12:21 AM  Result Value Ref Range   Sodium 135 135 - 145 mmol/L   Potassium 3.5 3.5 - 5.1 mmol/L   Chloride 96 (L) 98 - 111 mmol/L   CO2 29 22 - 32 mmol/L   Glucose, Bld 120 (H) 70 - 99 mg/dL    Comment: Glucose reference range applies only to samples taken after fasting for at least 8 hours.   BUN 15 8 - 23 mg/dL   Creatinine, Ser  0.72 0.61 - 1.24 mg/dL   Calcium 9.2 8.9 -  10.3 mg/dL   Total Protein 8.1 6.5 - 8.1 g/dL   Albumin 4.2 3.5 - 5.0 g/dL   AST 25 15 - 41 U/L   ALT 9 0 - 44 U/L   Alkaline Phosphatase 58 38 - 126 U/L   Total Bilirubin 0.6 0.3 - 1.2 mg/dL   GFR, Estimated >60 >60 mL/min    Comment: (NOTE) Calculated using the CKD-EPI Creatinine Equation (2021)    Anion gap 10 5 - 15    Comment: Performed at [facility], [address], [place], [place] [address]   US Carotid Bilateral (at [facility] and [facility] only)  Result Date: 07/26/2020 CLINICAL DATA:  86 year old with TIA. Reportedly, the patient has a right MCA branch occlusion on outside CTA. EXAM: BILATERAL CAROTID DUPLEX ULTRASOUND TECHNIQUE: Pearline Cables scale imaging, color Doppler and duplex ultrasound were performed of bilateral carotid and vertebral arteries in the neck. COMPARISON:  None. FINDINGS: Criteria: Quantification of carotid stenosis is based on velocity parameters that correlate the residual internal carotid diameter with NASCET-based stenosis levels, using the diameter of the distal internal carotid lumen as the denominator for stenosis measurement. The following velocity measurements were obtained: RIGHT ICA: 83/18 cm/sec CCA: 0000000 cm/sec SYSTOLIC ICA/CCA RATIO:  0.9 ECA: 100 cm/sec LEFT ICA: 90/12 cm/sec CCA: 123XX123 cm/sec SYSTOLIC ICA/CCA RATIO:  1.2 ECA: 58 cm/sec RIGHT CAROTID ARTERY: Intimal thickening in the distal common carotid artery. Small amount of heterogeneous plaque at the right carotid bulb. External carotid artery is patent with normal waveform. Right internal carotid artery is tortuous. Normal waveforms and velocities in the internal carotid artery. RIGHT VERTEBRAL ARTERY:  Not visualized. LEFT CAROTID ARTERY: Intimal thickening and small amount of plaque in the left common carotid artery. External carotid artery is patent with normal waveform. Left internal carotid artery is tortuous. Normal waveforms and velocities in the internal carotid  artery. LEFT VERTEBRAL ARTERY:  Not visualized IMPRESSION: 1. Mild atherosclerotic disease in the bilateral carotid arteries. Estimated degree of stenosis in the internal carotid arteries is less than 50% bilaterally. 2. Bilateral vertebral arteries are not visualized. Cannot exclude occlusive disease. Electronically Signed   By: Markus Daft M.D.   On: 07/26/2020 12:01   ECHOCARDIOGRAM COMPLETE BUBBLE STUDY  Result Date: 07/26/2020    ECHOCARDIOGRAM REPORT   Patient Name:   YUVAAN KAUK Date of Exam: 07/26/2020 Medical Rec #:  [id number]          Height:       73.0 in Accession #:    [id number]         Weight:       160.0 lb Date of Birth:  Mar 10, 1928          BSA:          1.957 m Patient Age:    34 years           BP:           125/68 mmHg Patient Gender: M                  HR:           73 bpm. Exam Location:  [facility] Procedure: 2D Echo, Cardiac Doppler and Color Doppler Indications:    TIA (transient ischemic attack) 435.9 / G45.9  History:        Patient has prior history of Echocardiogram examinations, most                 recent 01/25/2016. Stroke; Risk Factors:Hypertension and  Dyslipidemia. Parkinson disease, Cancer, Dementia ([place]) (From                 Hx).  Sonographer:    Alvino Chapel RCS Referring Phys: [id number] [place]  1. Left ventricular ejection fraction, by estimation, is 60 to 65%. The left ventricle has normal function. The left ventricle has no regional wall motion abnormalities. Left ventricular diastolic parameters are consistent with Grade I diastolic dysfunction (impaired relaxation).  2. Right ventricular systolic function is normal. The right ventricular size is normal. There is normal pulmonary artery systolic pressure.  3. The mitral valve is normal in structure. Trivial mitral valve regurgitation. No evidence of mitral stenosis.  4. The aortic valve is normal in structure. Aortic valve regurgitation is not visualized. Mild to moderate  aortic valve sclerosis/calcification is present, without any evidence of aortic stenosis.  5. The inferior vena cava is normal in size with greater than 50% respiratory variability, suggesting right atrial pressure of 3 mmHg. Conclusion(s)/Recommendation(s): No intracardiac source of embolism detected on this transthoracic study. A transesophageal echocardiogram is recommended to exclude cardiac source of embolism if clinically indicated. FINDINGS  Left Ventricle: Left ventricular ejection fraction, by estimation, is 60 to 65%. The left ventricle has normal function. The left ventricle has no regional wall motion abnormalities. The left ventricular internal cavity size was normal in size. There is  no left ventricular hypertrophy. Left ventricular diastolic parameters are consistent with Grade I diastolic dysfunction (impaired relaxation). Normal left ventricular filling pressure. Right Ventricle: The right ventricular size is normal. No increase in right ventricular wall thickness. Right ventricular systolic function is normal. There is normal pulmonary artery systolic pressure. The tricuspid regurgitant velocity is 2.27 m/s, and  with an assumed right atrial pressure of 3 mmHg, the estimated right ventricular systolic pressure is 10.1 mmHg. Left Atrium: Left atrial size was normal in size. Right Atrium: Right atrial size was normal in size. Pericardium: There is no evidence of pericardial effusion. Mitral Valve: The mitral valve is normal in structure. Trivial mitral valve regurgitation. No evidence of mitral valve stenosis. Tricuspid Valve: The tricuspid valve is normal in structure. Tricuspid valve regurgitation is mild . No evidence of tricuspid stenosis. Aortic Valve: The aortic valve is normal in structure. Aortic valve regurgitation is not visualized. Mild to moderate aortic valve sclerosis/calcification is present, without any evidence of aortic stenosis. Pulmonic Valve: The pulmonic valve was normal in  structure. Pulmonic valve regurgitation is not visualized. No evidence of pulmonic stenosis. Aorta: The aortic root is normal in size and structure. Venous: The inferior vena cava is normal in size with greater than 50% respiratory variability, suggesting right atrial pressure of 3 mmHg. IAS/Shunts: No atrial level shunt detected by color flow Doppler. Agitated saline contrast was given intravenously to evaluate for intracardiac shunting.  LEFT VENTRICLE PLAX 2D LVIDd:         3.20 cm  Diastology LVIDs:         2.30 cm  LV e' medial:    6.20 cm/s LV PW:         1.00 cm  LV E/e' medial:  12.2 LV IVS:        1.00 cm  LV e' lateral:   7.07 cm/s LVOT diam:     2.20 cm  LV E/e' lateral: 10.7 LV SV:         86 LV SV Index:   44 LVOT Area:     3.80 cm  RIGHT VENTRICLE TAPSE (  M-mode): 2.2 cm LEFT ATRIUM             Index       RIGHT ATRIUM           Index LA diam:        2.70 cm 1.38 cm/m  RA Area:     16.80 cm LA Vol (A2C):   29.4 ml 15.02 ml/m RA Volume:   42.90 ml  21.92 ml/m LA Vol (A4C):   38.5 ml 19.67 ml/m LA Biplane Vol: 35.6 ml 18.19 ml/m  AORTIC VALVE LVOT Vmax:   90.10 cm/s LVOT Vmean:  56.400 cm/s LVOT VTI:    0.227 m  AORTA Ao Root diam: 3.70 cm MITRAL VALVE               TRICUSPID VALVE MV Area (PHT): 3.24 cm    TR Peak grad:   20.6 mmHg MV Decel Time: 234 msec    TR Vmax:        227.00 cm/s MV E velocity: 75.80 cm/s MV A velocity: 94.30 cm/s  SHUNTS MV E/A ratio:  0.80        Systemic VTI:  0.23 m                            Systemic Diam: 2.20 cm Ena Dawley MD Electronically signed by Ena Dawley MD Signature Date/Time: 07/26/2020/1:49:44 PM    Final     Pending Labs Unresulted Labs (From admission, onward)          Start     Ordered   07/27/20 0500  Lipid panel  Once,   R        07/27/20 0500   07/27/20 0500  Lipid panel  Once,   R        07/27/20 0500          Vitals/Pain Today's Vitals   07/26/20 1630 07/26/20 1704 07/26/20 1730 07/26/20 1800  BP: (!) 162/99 138/81 (!)  143/73 (!) 152/75  Pulse: 91 71 76 91  Resp: 20 18 20 20   Temp:      TempSrc:      SpO2: 96% 98% 98% 98%  Weight:      Height:      PainSc:        Isolation Precautions No active isolations  Medications Medications   stroke: mapping our early stages of recovery book ( Does not apply Not Given 07/26/20 0434)  labetalol (NORMODYNE) injection 10 mg (has no administration in time range)  aspirin EC tablet 81 mg (81 mg Oral Not Given 07/26/20 1019)  clopidogrel (PLAVIX) tablet 75 mg (75 mg Oral Not Given 07/26/20 1019)  0.9 % NaCl with KCl 20 mEq/ L  infusion ( Intravenous New Bag/Given 07/26/20 1332)  acetaminophen (TYLENOL) tablet 650 mg (has no administration in time range)    Or  acetaminophen (TYLENOL) suppository 650 mg (has no administration in time range)  ondansetron (ZOFRAN) tablet 4 mg (has no administration in time range)    Or  ondansetron (ZOFRAN) injection 4 mg (has no administration in time range)  vitamin B-12 (CYANOCOBALAMIN) tablet 1,000 mcg (1,000 mcg Oral Not Given 07/26/20 1019)  venlafaxine (EFFEXOR) tablet 37.5 mg (37.5 mg Oral Not Given 07/26/20 1019)  traMADol (ULTRAM) tablet 50 mg (has no administration in time range)  simvastatin (ZOCOR) tablet 20 mg (20 mg Oral Not Given 07/26/20 1735)  primidone (MYSOLINE) tablet 150 mg (150 mg Oral Not  Given 07/26/20 1020)  albuterol (VENTOLIN HFA) 108 (90 Base) MCG/ACT inhaler 2 puff (has no administration in time range)  carbidopa-levodopa (SINEMET IR) 25-100 MG per tablet immediate release 3 tablet (3 tablets Oral Not Given 07/26/20 0823)  loratadine (CLARITIN) tablet 10 mg (has no administration in time range)  meclizine (ANTIVERT) tablet 25 mg (has no administration in time range)  pantoprazole (PROTONIX) EC tablet 40 mg (40 mg Oral Not Given 07/26/20 1020)  umeclidinium bromide (INCRUSE ELLIPTA) 62.5 MCG/INH 1 puff (1 puff Inhalation Not Given 07/26/20 0641)  polyvinyl alcohol (LIQUIFILM TEARS) 1.4 % ophthalmic  solution 1 drop (has no administration in time range)  LORazepam (ATIVAN) injection 1 mg (1 mg Intravenous Given 07/26/20 0015)  LORazepam (ATIVAN) injection 0.5 mg (0.5 mg Intravenous Given 07/26/20 1525)    Mobility walks with person assist High fall risk   Focused Assessments    R Recommendations: See Admitting Provider Note  Report given to:   Additional Notes:

## 2020-07-26 NOTE — ED Provider Notes (Signed)
Patient left at change of shift to get the results of his lab test.  Per his son patient was fine last night, December 24 when he went to bed.  However when he woke up on December 25 he was not right.  He had slurred speech and expressive aphasia.  There was a possible slight facial droop.  They went to the ED at [facility] ER around noon on December 25.  He was diagnosed with a stroke and was waiting for a bed for admission however patient became agitated and wanted to go home.  Family took him home and then brought him back.  Son states he does not have any obvious weakness.  Patient has underlying history of dementia.  Patient had to be given Ativan IM due to some agitation in the ED.  Our CT tech was able to obtain the CT reading results from [facility] from earlier today.  1:45 AM I attempted to speak to the neurologist at [facility] however [facility] will not let me speak to them, they state I have to speak to teleneurology.  01:53 AM Almira Coaster, manager for specialist on call will have the teleneurologist evaluate patient.  2:19 AM Dr Briant Cedar, teleneurologist has evaluated patient.  He has only noted left-sided neglect on his exam.  He is going to talk to intervention to make sure they would not want to do any procedures tonight but he feels like the patient is out of the window.  He states patient needs to be admitted for stroke work-up but does not need emergent MRI.  He felt patient could stay at our facility to be evaluated understanding there would be no MRI or neurologist here until tomorrow, December 27.  He states that his blood pressure should be allowed to get up to 190 systolic.  He states to start the patient on Plavix and aspirin.  Results for orders placed or performed during the hospital encounter of 07/25/20  Resp Panel by RT-PCR (Flu A&B, Covid) Nasopharyngeal Swab   Specimen: Nasopharyngeal Swab; Nasopharyngeal(NP) swabs in vial transport medium  Result Value Ref Range   SARS Coronavirus 2 by RT  PCR NEGATIVE NEGATIVE   Influenza A by PCR NEGATIVE NEGATIVE   Influenza B by PCR NEGATIVE NEGATIVE  Ethanol  Result Value Ref Range   Alcohol, Ethyl (B) <10 <10 mg/dL  Protime-INR  Result Value Ref Range   Prothrombin Time 12.8 11.4 - 15.2 seconds   INR 1.0 0.8 - 1.2  APTT  Result Value Ref Range   aPTT 30 24 - 36 seconds  CBC  Result Value Ref Range   WBC 7.8 4.0 - 10.5 K/uL   RBC 4.11 (L) 4.22 - 5.81 MIL/uL   Hemoglobin 12.2 (L) 13.0 - 17.0 g/dL   HCT 67.3 (L) 41.9 - 37.9 %   MCV 93.4 80.0 - 100.0 fL   MCH 29.7 26.0 - 34.0 pg   MCHC 31.8 30.0 - 36.0 g/dL   RDW 02.4 09.7 - 35.3 %   Platelets 231 150 - 400 K/uL   nRBC 0.0 0.0 - 0.2 %  Differential  Result Value Ref Range   Neutrophils Relative % 72 %   Neutro Abs 5.5 1.7 - 7.7 K/uL   Lymphocytes Relative 18 %   Lymphs Abs 1.4 0.7 - 4.0 K/uL   Monocytes Relative 9 %   Monocytes Absolute 0.7 0.1 - 1.0 K/uL   Eosinophils Relative 1 %   Eosinophils Absolute 0.1 0.0 - 0.5 K/uL   Basophils  Relative 0 %   Basophils Absolute 0.0 0.0 - 0.1 K/uL   Immature Granulocytes 0 %   Abs Immature Granulocytes 0.01 0.00 - 0.07 K/uL  Comprehensive metabolic panel  Result Value Ref Range   Sodium 135 135 - 145 mmol/L   Potassium 3.5 3.5 - 5.1 mmol/L   Chloride 96 (L) 98 - 111 mmol/L   CO2 29 22 - 32 mmol/L   Glucose, Bld 120 (H) 70 - 99 mg/dL   BUN 15 8 - 23 mg/dL   Creatinine, Ser 0.72 0.61 - 1.24 mg/dL   Calcium 9.2 8.9 - 10.3 mg/dL   Total Protein 8.1 6.5 - 8.1 g/dL   Albumin 4.2 3.5 - 5.0 g/dL   AST 25 15 - 41 U/L   ALT 9 0 - 44 U/L   Alkaline Phosphatase 58 38 - 126 U/L   Total Bilirubin 0.6 0.3 - 1.2 mg/dL   GFR, Estimated >60 >60 mL/min   Anion gap 10 5 - 15   Laboratory interpretation all normal except mild anemia   EKG Interpretation  Date/Time:  Saturday July 25 2020 22:55:30 EST Ventricular Rate:  76 PR Interval:    QRS Duration: 93 QT Interval:  412 QTC Calculation: 464 R Axis:   90 Text  Interpretation: Sinus rhythm Borderline right axis deviation Borderline ST elevation, lateral leads Confirmed by Gerlene Fee [phone]) on 07/25/2020 11:04:03 PM                  Diagnoses that have been ruled out:  None  Diagnoses that are still under consideration:  None  Final diagnoses:  Acute ischemic stroke [facility])  Cerebrovascular accident (CVA), unspecified mechanism ([place])   Plan admission  Rolland Porter, MD, Barbette Or, MD 07/26/20 [phone]

## 2020-07-26 NOTE — Evaluation (Signed)
Physical Therapy Evaluation Patient Details Name: Jeffrey Blair MRN: [id number] DOB: 1928-01-22 Today's Date: 07/26/2020   History of Present Illness  Jeffrey Blair is a 86 y.o. male with medical history significant of prostate cancer in remission, history of other nonhemorrhagic CVA, TIA, Parkinson's disease, migraine headaches, cervical disc disease, dementia who is brought to the emergency department by his son from [place] after he was taken very due to [place] secondary to acute other nonhemorrhagic CVA.  The patient's son stated he was at his usual baseline on Friday, 07/24/2020.  He had dinner earlier in the evening and and went to bed around 2000.  Then, next day when he woke up he was having slurred speech and possibly a slight facial droop per patient's daughter and he was taken to [facility] ED where he was waiting for a bed in the emergency department to be admitted for acute CVA.  However, there were no available beds and the patient became agitated while waiting in the emergency department.  He was subsequently signed out AMA by family members and taken back home where family members noticed he was very confused, did not know where he was and they became concerned and brought him to the [facility] emergency department.  The story is taken from the patient's son as he is unable to provide any information at this time and is currently under sedation.  The patient son states that at baseline he recognizes family members and is able to carry on a conversation.  He is usually oriented to place.  Sometimes he is oriented to time and date.    Clinical Impression  Patient presents confused requiring constant verbal/tactile cueing to follow instructions, limited to taking a few steps at bedside before having to sit due to easily distracted with poor focus on completing functional tasks.  Patient put back to bed with his son present in room.  Patient will benefit from continued physical therapy in  hospital and recommended venue below to increase strength, balance, endurance for safe ADLs and gait.    Follow Up Recommendations SNF;Supervision/Assistance - 24 hour;Supervision for mobility/OOB    Equipment Recommendations  None recommended by PT    Recommendations for Other Services       Precautions / Restrictions Precautions Precautions: Fall Restrictions Weight Bearing Restrictions: No      Mobility  Bed Mobility Overal bed mobility: Needs Assistance Bed Mobility: Supine to Sit;Sit to Supine     Supine to sit: Min assist Sit to supine: Min assist   General bed mobility comments: labored movement    Transfers Overall transfer level: Needs assistance Equipment used: 1 person hand held assist Transfers: Sit to/from [facility] Sit to Stand: Min assist;Mod assist Stand pivot transfers: Min assist;Mod assist       General transfer comment: very unsteady on feet with near loss of balance  Ambulation/Gait Ambulation/Gait assistance: Mod assist Gait Distance (Feet): 3 Feet Assistive device: 1 person hand held assist Gait Pattern/deviations: Decreased step length - left;Decreased step length - right;Decreased stride length;Shuffle Gait velocity: decreased   General Gait Details: limited to 2-3 steps at bedside mostly due to poor carryover for following instructions and anxiey  Stairs            Wheelchair Mobility    Modified Rankin (Stroke Patients Only)       Balance Overall balance assessment: Needs assistance Sitting-balance support: Feet supported;No upper extremity supported Sitting balance-Leahy Scale: Fair Sitting balance - Comments: seated at bedside  Standing balance support: During functional activity;Single extremity supported Standing balance-Leahy Scale: Poor Standing balance comment: very unsteady on feet with poor carryover for attempting to use RW                             Pertinent Vitals/Pain  Pain Assessment: No/denies pain    Home Living Family/patient expects to be discharged to:: Private residence Living Arrangements: Children Available Help at Discharge: Family;Available PRN/intermittently Type of Home: House Home Access: Ramped entrance     Home Layout: Two level;Able to live on main level with bedroom/bathroom Home Equipment: Gilford Rile - 2 wheels;Cane - single point;Shower seat - built in      Prior Function Level of [place]: Needs assistance   Gait / Transfers Assistance Needed: Household ambulator using [facility]  ADL's / Homemaking Assistance Needed: assisted by family        Hand Dominance   Dominant Hand: Right    Extremity/Trunk Assessment   Upper Extremity Assessment Upper Extremity Assessment: Defer to OT evaluation    Lower Extremity Assessment Lower Extremity Assessment: Generalized weakness    Cervical / Trunk Assessment Cervical / Trunk Assessment: Normal  Communication   Communication: Other (comment) (Patient appears confused and requires repeated verbal/tactile cueing)  Cognition Arousal/Alertness: Awake/alert Behavior During Therapy: Restless;Impulsive;Anxious Overall Cognitive Status: History of cognitive impairments - at baseline                                 General Comments: Patient more confused than normal per his son      General Comments      Exercises     Assessment/Plan    PT Assessment Patient needs continued PT services  PT Problem List Decreased strength;Decreased activity tolerance;Decreased balance;Decreased mobility;Decreased knowledge of use of DME;Decreased safety awareness       PT Treatment Interventions Gait training;Stair training;DME instruction;Functional mobility training;Therapeutic activities;Therapeutic exercise;Balance training;Patient/family education    PT Goals (Current goals can be found in the Care Plan section)  Acute Rehab PT Goals Patient Stated Goal: return home PT  Goal Formulation: With patient/family Time For Goal Achievement: 08/09/20 Potential to Achieve Goals: Good    Frequency Min 3X/week   Barriers to discharge        Co-evaluation               AM-PAC PT "6 Clicks" Mobility  Outcome Measure Help needed turning from your back to your side while in a flat bed without using bedrails?: None Help needed moving from lying on your back to sitting on the side of a flat bed without using bedrails?: A Little Help needed moving to and from a bed to a chair (including a wheelchair)?: A Lot Help needed standing up from a chair using your arms (e.g., wheelchair or bedside chair)?: A Lot Help needed to walk in hospital room?: A Lot Help needed climbing 3-5 steps with a railing? : A Lot 6 Click Score: 15    End of Session Equipment Utilized During Treatment: Gait belt Activity Tolerance: Patient limited by fatigue;Treatment limited secondary to agitation Patient left: in bed;with call bell/phone within reach;with family/visitor present Nurse Communication: Mobility status PT Visit Diagnosis: Unsteadiness on feet (R26.81);Other abnormalities of gait and mobility (R26.89);Muscle weakness (generalized) (M62.81)    Time: [id number] PT Time Calculation (min) (ACUTE ONLY): 34 min   Charges:   PT Evaluation $PT Eval Moderate Complexity:  1 Mod PT Treatments $Therapeutic Activity: 23-37 mins        2:19 PM, 07/26/20 Lonell Grandchild, MPT Physical Therapist with [facility] [phone] office [phone] mobile phone

## 2020-07-26 NOTE — ED Notes (Signed)
Incruse mdi placed in room. Patient is confused.

## 2020-07-26 NOTE — Progress Notes (Signed)
*  PRELIMINARY RESULTS* Echocardiogram 2D Echocardiogram has been performed with saline bubble study.  Jeffrey Blair 07/26/2020, 1:35 PM

## 2020-07-27 ENCOUNTER — Observation Stay (HOSPITAL_COMMUNITY): Payer: PPO

## 2020-07-27 DIAGNOSIS — G459 Transient cerebral ischemic attack, unspecified: Secondary | ICD-10-CM | POA: Diagnosis present

## 2020-07-27 DIAGNOSIS — G9341 Metabolic encephalopathy: Secondary | ICD-10-CM | POA: Diagnosis not present

## 2020-07-27 DIAGNOSIS — E785 Hyperlipidemia, unspecified: Secondary | ICD-10-CM | POA: Diagnosis not present

## 2020-07-27 DIAGNOSIS — F0280 Dementia in other diseases classified elsewhere without behavioral disturbance: Secondary | ICD-10-CM | POA: Diagnosis present

## 2020-07-27 DIAGNOSIS — Z7189 Other specified counseling: Secondary | ICD-10-CM | POA: Diagnosis not present

## 2020-07-27 DIAGNOSIS — F0281 Dementia in other diseases classified elsewhere with behavioral disturbance: Secondary | ICD-10-CM | POA: Diagnosis not present

## 2020-07-27 DIAGNOSIS — Z8546 Personal history of malignant neoplasm of prostate: Secondary | ICD-10-CM | POA: Diagnosis not present

## 2020-07-27 DIAGNOSIS — E871 Hypo-osmolality and hyponatremia: Secondary | ICD-10-CM | POA: Diagnosis not present

## 2020-07-27 DIAGNOSIS — I639 Cerebral infarction, unspecified: Secondary | ICD-10-CM

## 2020-07-27 DIAGNOSIS — I6503 Occlusion and stenosis of bilateral vertebral arteries: Secondary | ICD-10-CM | POA: Diagnosis not present

## 2020-07-27 DIAGNOSIS — I6389 Other cerebral infarction: Secondary | ICD-10-CM | POA: Diagnosis not present

## 2020-07-27 DIAGNOSIS — R451 Restlessness and agitation: Secondary | ICD-10-CM | POA: Diagnosis not present

## 2020-07-27 DIAGNOSIS — R627 Adult failure to thrive: Secondary | ICD-10-CM | POA: Diagnosis not present

## 2020-07-27 DIAGNOSIS — N390 Urinary tract infection, site not specified: Secondary | ICD-10-CM | POA: Diagnosis not present

## 2020-07-27 DIAGNOSIS — A4151 Sepsis due to Escherichia coli [E. coli]: Secondary | ICD-10-CM | POA: Diagnosis not present

## 2020-07-27 DIAGNOSIS — R6520 Severe sepsis without septic shock: Secondary | ICD-10-CM | POA: Diagnosis not present

## 2020-07-27 DIAGNOSIS — G20 Parkinson's disease: Secondary | ICD-10-CM | POA: Diagnosis not present

## 2020-07-27 DIAGNOSIS — R471 Dysarthria and anarthria: Secondary | ICD-10-CM | POA: Diagnosis present

## 2020-07-27 DIAGNOSIS — I63511 Cerebral infarction due to unspecified occlusion or stenosis of right middle cerebral artery: Secondary | ICD-10-CM | POA: Diagnosis present

## 2020-07-27 DIAGNOSIS — E43 Unspecified severe protein-calorie malnutrition: Secondary | ICD-10-CM | POA: Diagnosis not present

## 2020-07-27 DIAGNOSIS — Z8673 Personal history of transient ischemic attack (TIA), and cerebral infarction without residual deficits: Secondary | ICD-10-CM | POA: Diagnosis not present

## 2020-07-27 DIAGNOSIS — Z515 Encounter for palliative care: Secondary | ICD-10-CM | POA: Diagnosis not present

## 2020-07-27 DIAGNOSIS — G936 Cerebral edema: Secondary | ICD-10-CM | POA: Diagnosis present

## 2020-07-27 DIAGNOSIS — I10 Essential (primary) hypertension: Secondary | ICD-10-CM | POA: Diagnosis not present

## 2020-07-27 DIAGNOSIS — R509 Fever, unspecified: Secondary | ICD-10-CM | POA: Diagnosis not present

## 2020-07-27 DIAGNOSIS — N179 Acute kidney failure, unspecified: Secondary | ICD-10-CM | POA: Diagnosis not present

## 2020-07-27 DIAGNOSIS — I679 Cerebrovascular disease, unspecified: Secondary | ICD-10-CM | POA: Diagnosis not present

## 2020-07-27 DIAGNOSIS — R4701 Aphasia: Secondary | ICD-10-CM | POA: Diagnosis present

## 2020-07-27 DIAGNOSIS — R1310 Dysphagia, unspecified: Secondary | ICD-10-CM | POA: Diagnosis not present

## 2020-07-27 DIAGNOSIS — R21 Rash and other nonspecific skin eruption: Secondary | ICD-10-CM | POA: Diagnosis not present

## 2020-07-27 DIAGNOSIS — Z801 Family history of malignant neoplasm of trachea, bronchus and lung: Secondary | ICD-10-CM | POA: Diagnosis not present

## 2020-07-27 DIAGNOSIS — R29810 Facial weakness: Secondary | ICD-10-CM | POA: Diagnosis present

## 2020-07-27 DIAGNOSIS — Z20822 Contact with and (suspected) exposure to covid-19: Secondary | ICD-10-CM | POA: Diagnosis present

## 2020-07-27 DIAGNOSIS — I739 Peripheral vascular disease, unspecified: Secondary | ICD-10-CM | POA: Diagnosis not present

## 2020-07-27 DIAGNOSIS — R29718 NIHSS score 18: Secondary | ICD-10-CM | POA: Diagnosis present

## 2020-07-27 DIAGNOSIS — J441 Chronic obstructive pulmonary disease with (acute) exacerbation: Secondary | ICD-10-CM | POA: Diagnosis not present

## 2020-07-27 DIAGNOSIS — Z66 Do not resuscitate: Secondary | ICD-10-CM | POA: Diagnosis not present

## 2020-07-27 DIAGNOSIS — I63231 Cerebral infarction due to unspecified occlusion or stenosis of right carotid arteries: Secondary | ICD-10-CM | POA: Diagnosis not present

## 2020-07-27 DIAGNOSIS — D649 Anemia, unspecified: Secondary | ICD-10-CM | POA: Diagnosis not present

## 2020-07-27 DIAGNOSIS — G43909 Migraine, unspecified, not intractable, without status migrainosus: Secondary | ICD-10-CM | POA: Diagnosis present

## 2020-07-27 DIAGNOSIS — R519 Headache, unspecified: Secondary | ICD-10-CM | POA: Diagnosis not present

## 2020-07-27 DIAGNOSIS — Z87891 Personal history of nicotine dependence: Secondary | ICD-10-CM | POA: Diagnosis not present

## 2020-07-27 DIAGNOSIS — G8104 Flaccid hemiplegia affecting left nondominant side: Secondary | ICD-10-CM | POA: Diagnosis present

## 2020-07-27 LAB — LIPID PANEL
Cholesterol: 241 mg/dL — ABNORMAL HIGH (ref 0–200)
HDL: 60 mg/dL (ref >40)
LDL Cholesterol: 165 mg/dL — ABNORMAL HIGH (ref 0–99)
Total CHOL/HDL Ratio: 4 RATIO
Triglycerides: 82 mg/dL (ref <150)
VLDL: 16 mg/dL (ref 0–40)

## 2020-07-27 LAB — HEMOGLOBIN A1C
Hgb A1c MFr Bld: 5.6 % (ref 4.8–5.6)
Mean Plasma Glucose: 114.02 mg/dL

## 2020-07-27 MED ORDER — LORAZEPAM 2 MG/ML IJ SOLN
1.0000 mg | INTRAMUSCULAR | Status: DC | PRN
  Administered 2020-07-27: 20:00:00 1 mg via INTRAVENOUS
  Filled 2020-07-27: qty 1

## 2020-07-27 MED ORDER — LORAZEPAM 2 MG/ML IJ SOLN
1.0000 mg | Freq: Once | INTRAMUSCULAR | Status: AC
  Administered 2020-07-27: 05:00:00 1 mg via INTRAVENOUS
  Filled 2020-07-27: qty 1

## 2020-07-27 MED ORDER — HALOPERIDOL LACTATE 5 MG/ML IJ SOLN
2.0000 mg | Freq: Four times a day (QID) | INTRAMUSCULAR | Status: DC | PRN
  Administered 2020-07-27 – 2020-07-29 (×3): 2 mg via INTRAVENOUS
  Filled 2020-07-27 (×3): qty 1

## 2020-07-27 NOTE — Evaluation (Signed)
Clinical/Bedside Swallow Evaluation Patient Details  Name: Jeffrey Blair MRN: [id number] Date of Birth: [birth year]  Today's Date: 07/27/2020 Time: SLP Start Time (ACUTE ONLY): 0848 SLP Stop Time (ACUTE ONLY): 0905 SLP Time Calculation (min) (ACUTE ONLY): 17 min  Past Medical History:  Past Medical History:  Diagnosis Date  . Cancer (HCC)    prostate-in remission  . Cerebrovascular disease 01/25/2016  . Cervical disc disease 01/25/2016  . Dementia (HCC)   . Migraine   . Parkinson disease (HCC)   . Stroke (HCC)   . TIA (transient ischemic attack) 01/24/2016   Past Surgical History: History reviewed. No pertinent surgical history. HPI:  Jeffrey Blair is a 86 y.o. male with medical history significant of prostate cancer in remission, history of other nonhemorrhagic CVA, TIA, Parkinson's disease, migraine headaches, cervical disc disease, dementia who is brought to the emergency department by his son from [place] after he was taken due to AMS secondary to acute other nonhemorrhagic CVA. MRI Head 07/27/20: Acute to subacute infarct in the posterior right MCA territory  infarct, and especially at the right MCA/PCA watershed (fetal type PCA origins)   Assessment / Plan / Recommendation Clinical Impression  Jeffrey Blair is significantly mentally altered this morning with limited participation in clinical swallow evaluation. Despite baseline dementia/parkinson's disease, family reports he is typically much more alert and conversant than this. He has notable L sided facial droop and labial weakness with anterior spillage of secretions. He was unable to participate in complete oral motor evaluation, however, he did do lingual protrusion, which was asymmetrical (deviates L). Wet vocal quality is heard both with and without PO intake. He was seen only with single small tsp of water, which spilled anteriorly L d/t reduced labial seal. Pt refused all further trials despite max cues. Recommend  continue NPO pending improved participation in evaluation. ST service to return as schedule allows and pt is appropriate. Recommend oral care QID.    SLP Visit Diagnosis: Dysphagia, oral phase (R13.11);Dysphagia, unspecified (R13.10)    Aspiration Risk  Moderate aspiration risk;Risk for inadequate nutrition/hydration    Diet Recommendation NPO   Medication Administration: Via alternative means    Other  Recommendations Oral Care Recommendations: Oral care QID   Follow up Recommendations Skilled Nursing facility      Frequency and Duration min 2x/week  2 weeks       Prognosis Prognosis for Safe Diet Advancement: Guarded Barriers to Reach Goals: Cognitive deficits;Motivation;Severity of deficits;Behavior      Swallow Study   General Date of Onset: 07/24/20 HPI: Jeffrey Blair is a 86 y.o. male with medical history significant of prostate cancer in remission, history of other nonhemorrhagic CVA, TIA, Parkinson's disease, migraine headaches, cervical disc disease, dementia who is brought to the emergency department by his son from [place] after he was taken due to AMS secondary to acute other nonhemorrhagic CVA. MRI Head 07/27/20: Acute to subacute infarct in the posterior right MCA territory  infarct, and especially at the right MCA/PCA watershed (fetal type PCA origins) Type of Study: Bedside Swallow Evaluation Previous Swallow Assessment: n/a Diet Prior to this Study: Information not available Temperature Spikes Noted: No Respiratory Status: Room air History of Recent Intubation: No Behavior/Cognition: Confused;Agitated;Impulsive;Uncooperative Oral Cavity Assessment: Excessive secretions;Other (comment) (drooling) Oral Care Completed by SLP: Recent completion by staff Oral Cavity - Dentition: Edentulous Vision: Impaired for self-feeding Self-Feeding Abilities: Refused PO Patient Positioning: Upright in bed Baseline Vocal Quality: Low vocal intensity;Wet Volitional Cough:  Cognitively unable to elicit  Volitional Swallow: Unable to elicit    Oral/Motor/Sensory Function Overall Oral Motor/Sensory Function: Moderate impairment Facial ROM: Reduced left Facial Symmetry: Abnormal symmetry left Facial Strength: Reduced left Lingual ROM: Reduced left Lingual Symmetry: Abnormal symmetry left Lingual Strength: Reduced   Ice Chips Ice chips: Not tested Other Comments: refused   Thin Liquid Thin Liquid: Impaired Presentation: Spoon Oral Phase Impairments: Reduced labial seal;Poor awareness of bolus Oral Phase Functional Implications: Left anterior spillage    Puree Puree: Not tested Other Comments: refused   Solid           Jeffrey Blair P. Coretha Creswell, M.S., [place] Pathologist Acute Rehabilitation Services Pager: [place] 07/27/2020,9:25 AM

## 2020-07-27 NOTE — Assessment & Plan Note (Signed)
-  Simvastatin ordered, still unsafe for pills; furthermore likely to have no mortality benefit at this point

## 2020-07-27 NOTE — Plan of Care (Signed)
  Problem: Ischemic Stroke/TIA Tissue Perfusion: Goal: Complications of ischemic stroke/TIA will be minimized Outcome: Progressing   Problem: Education: Goal: Knowledge of General Education information will improve Description: Including pain rating scale, medication(s)/side effects and non-pharmacologic comfort measures Outcome: Progressing   Problem: Education: Goal: Knowledge of disease or condition will improve Outcome: Progressing Goal: Knowledge of secondary prevention will improve Outcome: Progressing Goal: Knowledge of patient specific risk factors addressed and post discharge goals established will improve Outcome: Progressing Goal: Individualized Educational Video(s) Outcome: Progressing

## 2020-07-27 NOTE — TOC Initial Note (Signed)
Transition of Care [facility]) - Initial/Assessment Note    Patient Details  Name: Jeffrey Blair MRN: [id number] Date of Birth: 1927/12/04  Transition of Care [facility]) CM/SW Contact:    Benard Halsted, LCSW Phone Number: 07/27/2020, 3:22 PM  Clinical Narrative:                 CSW received SNF consult. CSW spoke with patient's son, [name]. He requested to learn more medical info on patient's condition before deciding discharge plan. CSW will touch base with him again.     Barriers to Discharge: Continued Medical Work up   Patient Goals and CMS Choice   CMS Medicare.gov Compare Post Acute Care list provided to:: Patient Represenative (must comment) Choice offered to / list presented to : Adult Children  Expected Discharge Plan and Services   In-house Referral: Clinical Social Work     Living arrangements for the past 2 months: Single Family Home                                      Prior Living Arrangements/Services Living arrangements for the past 2 months: Single Family Home Lives with:: Adult Children Patient language and need for interpreter reviewed:: Yes Do you feel safe going back to the place where you live?: Yes      Need for Family Participation in Patient Care: Yes (Comment) Care giver support system in place?: Yes (comment)   Criminal Activity/Legal Involvement Pertinent to Current Situation/Hospitalization: No - Comment as needed  Activities of Daily Living      Permission Sought/Granted Permission sought to share information with : Facility Contact Representative,Family Supports Permission granted to share information with : No  Share Information with NAME: [name]     Permission granted to share info w Relationship: Son  Permission granted to share info w Contact Information: [phone]  Emotional Assessment Appearance:: Appears stated age Attitude/Demeanor/Rapport: Unable to Assess Affect (typically observed): Unable to  Assess Orientation: : Oriented to Self,Oriented to Place Alcohol / Substance Use: Not Applicable Psych Involvement: No (comment)  Admission diagnosis:  TIA (transient ischemic attack) [G45.9] Acute ischemic stroke ([place]) [I63.9] Acute CVA (cerebrovascular accident) ([place]) [I63.9] Cerebrovascular accident (CVA) due to other mechanism [facility]) [I63.89] Cerebrovascular accident (CVA), unspecified mechanism ([place]) [I63.9] CVA (cerebral vascular accident) [facility]) [I63.9] Patient Active Problem List   Diagnosis Date Noted  . CVA (cerebral vascular accident) ([place]) 07/27/2020  . Cerebrovascular accident (CVA) due to other mechanism ([place]) 07/26/2020  . Hypertension 07/26/2020  . Normocytic anemia 07/26/2020  . Acute CVA (cerebrovascular accident) ([place]) 07/26/2020  . Cerebrovascular disease 01/25/2016  . Hyperlipidemia 01/25/2016  . Cervical disc disease 01/25/2016  . Elevated blood pressure 01/25/2016  . TIA (transient ischemic attack) 01/24/2016  . Parkinson disease ([place])   . Cancer ([place])   . Abdominal pain 05/21/2015  . COPD (chronic obstructive pulmonary disease) ([place]) 04/08/2015  . Cough 04/08/2015  . EARLY SATIETY 05/11/2009  . NAUSEA 05/06/2009  . NAUSEA WITH VOMITING 01/13/2009  . Abdominal pain, other specified site 01/13/2009  . MELENA, HX OF 01/13/2009   PCP:  Clinic, [place]:   [id number] [place], [place] [address] [place] [address] Phone: [phone] Fax: [phone]     Social Determinants of Health (SDOH) Interventions    Readmission Risk Interventions No flowsheet data found.

## 2020-07-27 NOTE — Assessment & Plan Note (Signed)
-  Labetalol as needed

## 2020-07-27 NOTE — Plan of Care (Signed)
  Problem: Education: Goal: Knowledge of secondary prevention will improve Outcome: Progressing   Problem: Education: Goal: Knowledge of patient specific risk factors addressed and post discharge goals established will improve Outcome: Progressing   

## 2020-07-27 NOTE — Progress Notes (Signed)
Pt  Is a 86 year old male , a transfer from [facility] via [place]. Pt alert  and oriented x 1 to person but disoriented to place & situation/ MAE.  Dx Stroke with slight left side weakness  Made comfortable in bed.  Safety maintained oriented to room and use of call light., slightly anxious. [facility] on call and neurology on call made aware.

## 2020-07-27 NOTE — Evaluation (Signed)
Occupational Therapy Evaluation Patient Details Name: Jeffrey Blair MRN: [id number] DOB: 1928-06-27 Today's Date: 07/27/2020    History of Present Illness 86 yo male admitted to [facility] from [place] [facility] [place] . CTA demonstrates R proximal MCA posterior division occlusion with distal reconstitution, severe stenosis of proximal basilar and P2 of R PCA. MRI Head 07/27/20 demonstrates acute to subacute infarct in the posterior right MCA territory  infarct, and especially at the right MCA/PCA watershed. Pt and family left AMA, returned. PMH includes parkinson's disease, CVA, prostate cancer, TIA, migraines, cervical disc disease, dementia.   Clinical Impression   PT admitted with AMS. Pt currently with functional limitiations due to the deficits listed below (see OT problem list). Pt currently requires BIL LE block with sit<>stand for safety. Pt reaching for environmental supports with standing. Pt with deformity noted on L LE  and question if pt used any special shoe/ brace for LLE prior to admission.  Pt will benefit from skilled OT to increase their independence and safety with adls and balance to allow discharge SNF.     Follow Up Recommendations  SNF;Other (comment) (from home family could refuse SNF)    Equipment Recommendations  3 in 1 bedside commode;Wheelchair (measurements OT);Wheelchair cushion (measurements OT)    Recommendations for Other Services       Precautions / Restrictions Precautions Precautions: Fall Restrictions Weight Bearing Restrictions: No      Mobility Bed Mobility Overal bed mobility: Needs Assistance Bed Mobility: Supine to Sit     Supine to sit: Min assist;+2 for safety/equipment     General bed mobility comments: min assist +2 for trunk elevation and scooting to EOB    Transfers Overall transfer level: Needs assistance Equipment used: 2 person hand held assist Transfers: Sit to/from Stand Sit to Stand: Mod assist;+2 physical  assistance;From elevated surface         General transfer comment: mod +2 for initial power up, rise, and steady. Repeated sit to stands as intervention, pt demonstrating forward sliding of LEs when rising and sitting which requires LE blocking    Balance Overall balance assessment: Needs assistance Sitting-balance support: Feet supported;No upper extremity supported Sitting balance-Leahy Scale: Poor Sitting balance - Comments: intermittent min assist to correct posterior preference Postural control: Posterior lean Standing balance support: During functional activity;Bilateral upper extremity supported Standing balance-Leahy Scale: Poor Standing balance comment: reliant on external support in standing                           ADL either performed or assessed with clinical judgement   ADL Overall ADL's : Needs assistance/impaired     Grooming: Wash/dry face;Min guard;Sitting Grooming Details (indicate cue type and reason): Pt requires bil Ue to wash face Upper Body Bathing: Moderate assistance;Sitting   Lower Body Bathing: Moderate assistance;Sit to/from stand           Toilet Transfer: +2 for physical assistance;Minimal assistance           Functional mobility during ADLs: +2 for physical assistance;Minimal assistance General ADL Comments: pt asked to walk toward the window and pt literally walks toward window and leans on the window seal     Vision         Perception     Praxis      Pertinent Vitals/Pain Pain Assessment: No/denies pain     Hand Dominance Right   Extremity/Trunk Assessment Upper Extremity Assessment Upper Extremity Assessment: Overall WFL for tasks  assessed   Lower Extremity Assessment Lower Extremity Assessment: Defer to PT evaluation   Cervical / Trunk Assessment Cervical / Trunk Assessment: Normal   Communication Communication Communication: Other (comment) (mumbles, speech unclear at times,)   Cognition  Arousal/Alertness: Awake/alert Behavior During Therapy: Restless;Impulsive;Anxious Overall Cognitive Status: History of cognitive impairments - at baseline                                 General Comments: Baseline dementia, pt oriented to self but not location. Requires frequent verbal and tactile cues for safety during mobility, pt responds literally to all verbal cues (for example: "lean forward" to remove gait belt - pt leans all the way to the floor)   General Comments  ? visual impairment - per sitter in room pt "only has peripheral vision", demonstrates poor visual locking    Exercises General Exercises - Lower Extremity Mini-Sqauts: AAROM;Both (x3 sit to stands as intervention from recliner, mod +2 to block LEs from sliding anteriorly, rocking trunk anteriorly, rise and steady)   Shoulder Instructions      Home Living Family/patient expects to be discharged to:: Private residence Living Arrangements: Children Available Help at Discharge: Family;Available PRN/intermittently Type of Home: House Home Access: Ramped entrance     Home Layout: Two level;Able to live on main level with bedroom/bathroom Alternate Level Stairs-Number of Steps: 15 Alternate Level Stairs-Rails: Right Bathroom Shower/Tub: Producer, television/film/video: Standard Bathroom Accessibility: Yes   Home Equipment: Environmental consultant - 2 wheels;Cane - single point;Shower seat - built in   Additional Comments: home information obtained from [facility] admission evaluation. No family present this session and pt poor historian      Prior Functioning/Environment Level of Independence: Needs assistance  Gait / Transfers Assistance Needed: Household ambulator using [facility] ADL's / Homemaking Assistance Needed: assisted by family            OT Problem List: Decreased activity tolerance;Impaired balance (sitting and/or standing);Decreased cognition;Decreased safety awareness;Decreased knowledge of use of DME or  AE;Decreased knowledge of precautions      OT Treatment/Interventions: Self-care/ADL training;Therapeutic exercise;Energy conservation;DME and/or AE instruction;Therapeutic activities;Cognitive remediation/compensation;Patient/family education;Balance training;Visual/perceptual remediation/compensation    OT Goals(Current goals can be found in the care plan section) Acute Rehab OT Goals Patient Stated Goal: return home OT Goal Formulation: Patient unable to participate in goal setting Time For Goal Achievement: 08/10/20 Potential to Achieve Goals: Good  OT Frequency: Min 2X/week   Barriers to D/C:            Co-evaluation PT/OT/SLP Co-Evaluation/Treatment: Yes Reason for Co-Treatment: For patient/therapist safety PT goals addressed during session: Mobility/safety with mobility;Balance;Strengthening/ROM OT goals addressed during session: ADL's and self-care      AM-PAC OT "6 Clicks" Daily Activity     Outcome Measure Help from another person eating meals?: A Lot Help from another person taking care of personal grooming?: A Lot Help from another person toileting, which includes using toliet, bedpan, or urinal?: A Lot Help from another person bathing (including washing, rinsing, drying)?: A Lot Help from another person to put on and taking off regular upper body clothing?: A Lot Help from another person to put on and taking off regular lower body clothing?: A Lot 6 Click Score: 12   End of Session Equipment Utilized During Treatment: Gait belt Nurse Communication: Mobility status;Precautions  Activity Tolerance: Patient tolerated treatment well Patient left: in chair;with call bell/phone within reach;with chair alarm set;with  nursing/sitter in room Freight forwarder present)  OT Visit Diagnosis: Unsteadiness on feet (R26.81);Muscle weakness (generalized) (M62.81)                Time: 6967-8938 OT Time Calculation (min): 16 min Charges:  OT General Charges $OT Visit: 1 Visit OT  Evaluation $OT Eval Low Complexity: 1 Low   Brynn, OTR/L  Acute Rehabilitation Services Pager: [phone] Office: [phone] .   Jeri Modena 07/27/2020, 3:10 PM

## 2020-07-27 NOTE — Assessment & Plan Note (Addendum)
-   per MRI brain: "Widespread patchy and confluent restricted diffusion in the posterior right MCA territory relatively sparing the superior perirolandic cortex. Patchy white matter and gray matter involvement, including the posterior right insula. The infarct is most confluent in a 7 cm area of the the right MCA/PCA watershed territory (series 5, image 72). No deep gray nuclei involvement." - given age and underlying dementia/comorbidites patient prognosis is poor; I have discussed this on the phone in detail with his son, [name] on 12/27 and 12/28 (family has elected for DNR at this time while watching patient progress) - continue asa/plavix for now - continue PT/OT/SLP (poor performance with all) - mild improvement in terms of swallow function (passed and being started on dysphagia 1 diet on 12/28); still believe he is a poor candidate for PEG tube and I have also discussed that with his son - IVF for now til PO better - haldol and ativan for any agitation/discomfort - continue sitter for safety  -Discussed status on phone with son on 07/28/2020.  Family has all talked (patient has 8 children) and at this time they are wishing to change status to DNR.

## 2020-07-27 NOTE — Assessment & Plan Note (Signed)
-  No benefit in aggressive work-up; hemoglobin relatively stable, 12 g/dL

## 2020-07-27 NOTE — Progress Notes (Signed)
Attempted MRI, pt uncooperative would not let us move him. Tried Comptroller for meds unable to reach RN. Pt transported back to his room at this time.

## 2020-07-27 NOTE — Progress Notes (Signed)
PROGRESS NOTE    Jeffrey Blair   VVO:[id number]  DOB: February 09, 1928  DOA: 07/25/2020     0  PCP: [facility], Jeffrey [facility]  CC: Change in behavior, balance, speech  Hospital Course: [name] is a 86 yo male with PMH Parkinson's disease, dementia, debility, hx cerebellar CVA (seen on imaging but u/k to family until now), cervical DDD who was brought to the hospital for worsening balance/mobility, worsening confusion, and dysarthria. Some work-up had been initiated at [facility] ER, however patient was signed out AMA by family then brought to [facility] and ultimately now at [facility]  His symptoms started on 07/25/2020. His son noted that he already had difficulty walking in the past but this has become worsened lately. MRI brain performed on admission showed chronic cerebellar infarcts.  There was also chronic bilateral white matter changes that had progressed since 2017.  MRI also showed an acute widespread infarct involving approximately a 7 cm area of the right MCA/PCA territory.  This likely explained the patient's severe decompensation with poor reserve to withstand this level stroke. He also failed a swallow eval performed on 07/27/2020 with speech therapy.  He was also only able to walk 5 feet with physical therapy requiring 2 person max assist. His findings were discussed with his son on the phone especially regarding decisions needing to be made regarding nutrition in the next coming days.  Plan was to monitor patient's progress or possible decline then make further decisions.   Interval History:  Patient seen in his room this morning very fidgety and sitter at his bedside.  He is redirectable at times and does follow some commands however remains impulsive and sitter is required for safety.  Called and discussed stroke findings with his son, Jeffrey Blair this afternoon.  Family will discuss this evening regarding his CODE STATUS as well as further goals of care. I have  conveyed that patient is still full code and if he does decompensate, he is not likely to survive CPR nor would it provide any meaningful recovery.  Family will discuss tonight.  Old records reviewed in assessment of this patient  ROS: Review of systems not obtained due to patient factors.  Cognitive impairment  Assessment & Plan: Acute CVA (cerebrovascular accident) (HCC) - per MRI brain: "Widespread patchy and confluent restricted diffusion in the posterior right MCA territory relatively sparing the superior perirolandic cortex. Patchy white matter and gray matter involvement, including the posterior right insula. The infarct is most confluent in a 7 cm area of the the right MCA/PCA watershed territory (series 5, image 72). No deep gray nuclei involvement." - given age and underlying dementia/comorbidites patient prognosis is poor; I have discussed this on the phone in detail with his son, Jeffrey Blair on 12/27 - continue asa/plavix for now (however is NPO); low thereshold to pursue comfort measures if declines - we also discussed code status: Family will discuss CODE STATUS tonight and he remains full code for now - continue PT/OT/SLP (poor performance with all) - keep NPO, did not pass swallow evaluation (unable to administer pills for now until further family discussions).  Poor candidate for PEG tube and I have also discussed that with his son - IVF for now given NPO - haldol and ativan for any agitation/discomfort - continue sitter for safety   Parkinson disease (HCC) -Not safe for pills at this time in setting of large stroke.  Holding off on tube feed placement as well at this time -Patient on Sinemet at  home and primidone   Normocytic anemia -No benefit in aggressive work-up; hemoglobin relatively stable, 12 g/dL  Hypertension -Labetalol as needed  Hyperlipidemia -Simvastatin ordered, still unsafe for pills; furthermore likely to have no mortality benefit at this point  COPD  (chronic obstructive pulmonary disease) (HCC) -No signs or symptoms of exacerbation -Continue Incruse Ellipta    Antimicrobials: n/a  DVT prophylaxis: SCD Code Status: Full.  If patient does code, call family immediately to recommend discontinuing aggressive measures Jeffrey Blair) Family Communication: son, Jeffrey Blair on phone Disposition Plan: Status is: Inpatient  Remains inpatient appropriate because:Altered mental status, Unsafe d/c plan and Inpatient level of care appropriate due to severity of illness   Dispo: The patient is from: Home              Anticipated d/c is to: pending clinical course              Anticipated d/c date is: > 3 days              Patient currently is not medically stable to d/c.       Objective: Blood pressure (!) 147/77, pulse 92, temperature 98.3 F (36.8 C), temperature source Oral, resp. rate 20, height 6\' 1"  (1.854 m), weight 72.6 kg, SpO2 99 %.  Examination: General appearance: Chronically ill-appearing elderly gentleman moving around in bed impulsively but does follow some commands at times Head: Normocephalic, without obvious abnormality Eyes: EOMI Lungs: clear to auscultation bilaterally Heart: regular rate and rhythm and S1, S2 normal Abdomen: normal findings: bowel sounds normal and soft, non-tender Extremities: Irregular left leg appreciated with asymmetric edema consistent with probable previous leg fracture Skin: mobility and turgor normal Neurologic: Raised arms and legs to command.  Speech very dysarthric and possible component of aphasia.  Gait deferred but unsteady with PT Psych: Rapid speech  Consultants:   Neuro  Procedures:     Data Reviewed: I have personally reviewed following labs and imaging studies Results for orders placed or performed during the hospital encounter of 07/25/20 (from the past 24 hour(s))  Lipid panel     Status: Abnormal   Collection Time: 07/27/20 10:13 AM  Result Value Ref Range   Cholesterol 241  (H) 0 - 200 mg/dL   Triglycerides 82 07/29/20 mg/dL   HDL 60 <076 mg/dL   Total CHOL/HDL Ratio 4.0 RATIO   VLDL 16 0 - 40 mg/dL   LDL Cholesterol >22 (H) 0 - 99 mg/dL  Hemoglobin 633     Status: None   Collection Time: 07/27/20 10:13 AM  Result Value Ref Range   Hgb A1c MFr Bld 5.6 4.8 - 5.6 %   Mean Plasma Glucose 114.02 mg/dL    Recent Results (from the past 240 hour(s))  Resp Panel by RT-PCR (Flu A&B, Covid) Nasopharyngeal Swab     Status: None   Collection Time: 07/25/20 11:10 PM   Specimen: Nasopharyngeal Swab; Nasopharyngeal(NP) swabs in vial transport medium  Result Value Ref Range Status   SARS Coronavirus 2 by RT PCR NEGATIVE NEGATIVE Final    Comment: (NOTE) SARS-CoV-2 target nucleic acids are NOT DETECTED.  The SARS-CoV-2 RNA is generally detectable in upper respiratory specimens during the acute phase of infection. The lowest concentration of SARS-CoV-2 viral copies this assay can detect is 138 copies/mL. A negative result does not preclude SARS-Cov-2 infection and should not be used as the sole basis for treatment or other patient management decisions. A negative result may occur with  improper specimen collection/handling, submission of  specimen other than nasopharyngeal swab, presence of viral mutation(s) within the areas targeted by this assay, and inadequate number of viral copies(<138 copies/mL). A negative result must be combined with clinical observations, patient history, and epidemiological information. The expected result is Negative.  Fact Sheet for Patients:  BloggerCourse.com  Fact Sheet for Healthcare Providers:  SeriousBroker.it  This test is no t yet approved or cleared by the [place] FDA and  has been authorized for detection and/or diagnosis of SARS-CoV-2 by FDA under an Emergency Use Authorization (EUA). This EUA will remain  in effect (meaning this test can be used) for the duration of  the COVID-19 declaration under Section 564(b)(1) of the Act, 21 U.S.C.section 360bbb-3(b)(1), unless the authorization is terminated  or revoked sooner.       Influenza A by PCR NEGATIVE NEGATIVE Final   Influenza B by PCR NEGATIVE NEGATIVE Final    Comment: (NOTE) The Xpert Xpress SARS-CoV-2/FLU/RSV plus assay is intended as an aid in the diagnosis of influenza from Nasopharyngeal swab specimens and should not be used as a sole basis for treatment. Nasal washings and aspirates are unacceptable for Xpert Xpress SARS-CoV-2/FLU/RSV testing.  Fact Sheet for Patients: BloggerCourse.com  Fact Sheet for Healthcare Providers: SeriousBroker.it  This test is not yet approved or cleared by the [place] FDA and has been authorized for detection and/or diagnosis of SARS-CoV-2 by FDA under an Emergency Use Authorization (EUA). This EUA will remain in effect (meaning this test can be used) for the duration of the COVID-19 declaration under Section 564(b)(1) of the Act, 21 U.S.C. section 360bbb-3(b)(1), unless the authorization is terminated or revoked.  Performed at [facility], [address] [place], [place] [address]      Radiology Studies: MR BRAIN WO CONTRAST  Result Date: 07/27/2020 CLINICAL DATA:  86 year old male with slurred speech and neurologic deficit since 07/25/2020. Left AMA from [facility] waiting for admission. EXAM: MRI HEAD WITHOUT CONTRAST TECHNIQUE: Multiplanar, multiecho pulse sequences of the brain and surrounding structures were obtained without intravenous contrast. COMPARISON:  Brain MRI and MRA 01/25/2016. FINDINGS: Brain: Widespread patchy and confluent restricted diffusion in the posterior right MCA territory relatively sparing the superior perirolandic cortex. Patchy white matter and gray matter involvement, including the posterior right insula. The infarct is most confluent in a 7 cm area of the  the right MCA/PCA watershed territory (series 5, image 72). No deep gray nuclei involvement. Cytotoxic edema with confluent T2 and FLAIR hyperintensity. Some of the affected parenchyma is T1 hypointense. Questionable petechial hemorrhage on SWI but no malignant hemorrhagic transformation. No mass effect. No restricted diffusion elsewhere. No midline shift, mass effect, evidence of mass lesion, ventriculomegaly, extra-axial collection. No extra-axial or intraventricular blood. Cervicomedullary junction and pituitary are within normal limits. Patchy chronic bilateral white matter T2 and FLAIR hyperintensity in both hemispheres has mildly progressed since 2017. No definite chronic cortical encephalomalacia. Subtle chronic infarcts in the cerebellum, including series 14, image 3 on the left which is new since 2017. Occasional cerebellar microhemorrhages in the left hemisphere. Vascular: Chronically poor distal vertebral artery flow voids. Evidence of reconstituted flow in the basilar, stable since 2017. Fetal type PCA origins demonstrated previously. ICA flow voids appears stable. Skull and upper cervical spine: Chronic cervical spine degeneration with mild if any visible spinal stenosis. Background bone marrow signal within normal limits. Sinuses/Orbits: Negative orbits. Mild paranasal sinus mucosal thickening not significantly changed from 2017. Other: Mastoids remain clear. Visible internal auditory structures appear normal. Negative scalp and  face soft tissues. IMPRESSION: 1. Acute to subacute infarct in the posterior right MCA territory infarct, and especially at the right MCA/PCA watershed (fetal type PCA origins, see #2). Cytotoxic edema with possible petechial hemorrhage but no malignant hemorrhagic transformation or mass effect. 2. Chronically poor distal vertebral artery flow with evidence of reconstituted basilar, grossly stable since 2017. 3. Mild progression of underlying chronic small vessel disease since  2017. Electronically Signed   By: Odessa Fleming M.D.   On: 07/27/2020 05:45   US Carotid Bilateral (at [facility] and [facility] only)  Result Date: 07/26/2020 CLINICAL DATA:  86 year old with TIA. Reportedly, the patient has a right MCA branch occlusion on outside CTA. EXAM: BILATERAL CAROTID DUPLEX ULTRASOUND TECHNIQUE: Wallace Cullens scale imaging, color Doppler and duplex ultrasound were performed of bilateral carotid and vertebral arteries in the neck. COMPARISON:  None. FINDINGS: Criteria: Quantification of carotid stenosis is based on velocity parameters that correlate the residual internal carotid diameter with NASCET-based stenosis levels, using the diameter of the distal internal carotid lumen as the denominator for stenosis measurement. The following velocity measurements were obtained: RIGHT ICA: 83/18 cm/sec CCA: 91/9 cm/sec SYSTOLIC ICA/CCA RATIO:  0.9 ECA: 100 cm/sec LEFT ICA: 90/12 cm/sec CCA: 73/12 cm/sec SYSTOLIC ICA/CCA RATIO:  1.2 ECA: 58 cm/sec RIGHT CAROTID ARTERY: Intimal thickening in the distal common carotid artery. Small amount of heterogeneous plaque at the right carotid bulb. External carotid artery is patent with normal waveform. Right internal carotid artery is tortuous. Normal waveforms and velocities in the internal carotid artery. RIGHT VERTEBRAL ARTERY:  Not visualized. LEFT CAROTID ARTERY: Intimal thickening and small amount of plaque in the left common carotid artery. External carotid artery is patent with normal waveform. Left internal carotid artery is tortuous. Normal waveforms and velocities in the internal carotid artery. LEFT VERTEBRAL ARTERY:  Not visualized IMPRESSION: 1. Mild atherosclerotic disease in the bilateral carotid arteries. Estimated degree of stenosis in the internal carotid arteries is less than 50% bilaterally. 2. Bilateral vertebral arteries are not visualized. Cannot exclude occlusive disease. Electronically Signed   By: Richarda Overlie M.D.   On: 07/26/2020 12:01   ECHOCARDIOGRAM  COMPLETE BUBBLE STUDY  Result Date: 07/26/2020    ECHOCARDIOGRAM REPORT   Patient Name:   Jeffrey Blair Date of Exam: 07/26/2020 Medical Rec #:  [id number]          Height:       73.0 in Accession #:    [id number]         Weight:       160.0 lb Date of Birth:  01-03-1928          BSA:          1.957 m Patient Age:    89 years           BP:           125/68 mmHg Patient Gender: M                  HR:           73 bpm. [facility] Procedure: 2D Echo, Cardiac Doppler and Color Doppler Indications:    TIA (transient ischemic attack) 435.9 / G45.9  History:        Patient has prior history of Echocardiogram examinations, most                 recent 01/25/2016. Stroke; Risk Factors:Hypertension and  Dyslipidemia. Parkinson disease, Cancer, Dementia ([place]) (From                 Hx).  Sonographer:    Alvino Chapel RCS Referring Phys: [id number] [place]  1. Left ventricular ejection fraction, by estimation, is 60 to 65%. The left ventricle has normal function. The left ventricle has no regional wall motion abnormalities. Left ventricular diastolic parameters are consistent with Grade I diastolic dysfunction (impaired relaxation).  2. Right ventricular systolic function is normal. The right ventricular size is normal. There is normal pulmonary artery systolic pressure.  3. The mitral valve is normal in structure. Trivial mitral valve regurgitation. No evidence of mitral stenosis.  4. The aortic valve is normal in structure. Aortic valve regurgitation is not visualized. Mild to moderate aortic valve sclerosis/calcification is present, without any evidence of aortic stenosis.  5. The inferior vena cava is normal in size with greater than 50% respiratory variability, suggesting right atrial pressure of 3 mmHg. Conclusion(s)/Recommendation(s): No intracardiac source of embolism detected on this transthoracic study. A transesophageal echocardiogram is recommended to exclude  cardiac source of embolism if clinically indicated. FINDINGS  Left Ventricle: Left ventricular ejection fraction, by estimation, is 60 to 65%. The left ventricle has normal function. The left ventricle has no regional wall motion abnormalities. The left ventricular internal cavity size was normal in size. There is  no left ventricular hypertrophy. Left ventricular diastolic parameters are consistent with Grade I diastolic dysfunction (impaired relaxation). Normal left ventricular filling pressure. Right Ventricle: The right ventricular size is normal. No increase in right ventricular wall thickness. Right ventricular systolic function is normal. There is normal pulmonary artery systolic pressure. The tricuspid regurgitant velocity is 2.27 m/s, and  with an assumed right atrial pressure of 3 mmHg, the estimated right ventricular systolic pressure is 54.2 mmHg. Left Atrium: Left atrial size was normal in size. Right Atrium: Right atrial size was normal in size. Pericardium: There is no evidence of pericardial effusion. Mitral Valve: The mitral valve is normal in structure. Trivial mitral valve regurgitation. No evidence of mitral valve stenosis. Tricuspid Valve: The tricuspid valve is normal in structure. Tricuspid valve regurgitation is mild . No evidence of tricuspid stenosis. Aortic Valve: The aortic valve is normal in structure. Aortic valve regurgitation is not visualized. Mild to moderate aortic valve sclerosis/calcification is present, without any evidence of aortic stenosis. Pulmonic Valve: The pulmonic valve was normal in structure. Pulmonic valve regurgitation is not visualized. No evidence of pulmonic stenosis. Aorta: The aortic root is normal in size and structure. Venous: The inferior vena cava is normal in size with greater than 50% respiratory variability, suggesting right atrial pressure of 3 mmHg. IAS/Shunts: No atrial level shunt detected by color flow Doppler. Agitated saline contrast was given  intravenously to evaluate for intracardiac shunting.  LEFT VENTRICLE PLAX 2D LVIDd:         3.20 cm  Diastology LVIDs:         2.30 cm  LV e' medial:    6.20 cm/s LV PW:         1.00 cm  LV E/e' medial:  12.2 LV IVS:        1.00 cm  LV e' lateral:   7.07 cm/s LVOT diam:     2.20 cm  LV E/e' lateral: 10.7 LV SV:         86 LV SV Index:   44 LVOT Area:     3.80 cm  RIGHT VENTRICLE TAPSE (  M-mode): 2.2 cm LEFT ATRIUM             Index       RIGHT ATRIUM           Index LA diam:        2.70 cm 1.38 cm/m  RA Area:     16.80 cm LA Vol (A2C):   29.4 ml 15.02 ml/m RA Volume:   42.90 ml  21.92 ml/m LA Vol (A4C):   38.5 ml 19.67 ml/m LA Biplane Vol: 35.6 ml 18.19 ml/m  AORTIC VALVE LVOT Vmax:   90.10 cm/s LVOT Vmean:  56.400 cm/s LVOT VTI:    0.227 m  AORTA Ao Root diam: 3.70 cm MITRAL VALVE               TRICUSPID VALVE MV Area (PHT): 3.24 cm    TR Peak grad:   20.6 mmHg MV Decel Time: 234 msec    TR Vmax:        227.00 cm/s MV E velocity: 75.80 cm/s MV A velocity: 94.30 cm/s  SHUNTS MV E/A ratio:  0.80        Systemic VTI:  0.23 m                            Systemic Diam: 2.20 cm Ena Dawley MD Electronically signed by Ena Dawley MD Signature Date/Time: 07/26/2020/1:49:44 PM    Final    MR BRAIN WO CONTRAST  Final Result    US Carotid Bilateral (at [facility] and [facility] only)  Final Result      Scheduled Meds: . aspirin EC  81 mg Oral Daily  . carbidopa-levodopa  3 tablet Oral BID  . clopidogrel  75 mg Oral Daily  . pantoprazole  40 mg Oral Daily  . primidone  150 mg Oral BID  . simvastatin  20 mg Oral QPM  . umeclidinium bromide  1 puff Inhalation Daily  . venlafaxine  37.5 mg Oral BID  . vitamin B-12  1,000 mcg Oral Daily   PRN Meds: acetaminophen **OR** acetaminophen, albuterol, haloperidol lactate, labetalol, loratadine, LORazepam, meclizine, ondansetron **OR** ondansetron (ZOFRAN) IV, polyvinyl alcohol, traMADol Continuous Infusions: . 0.9 % NaCl with KCl 20 mEq / L 75 mL/hr at 07/26/20  2232     LOS: 0 days  Time spent: Greater than 50% of the 35 minute visit was spent in counseling/coordination of care for the patient as laid out in the A&P.   Dwyane Dee, MD [facility] 07/27/2020, 4:43 PM

## 2020-07-27 NOTE — Assessment & Plan Note (Signed)
-  No signs or symptoms of exacerbation -Continue Incruse Ellipta

## 2020-07-27 NOTE — Progress Notes (Signed)
Physical Therapy Treatment Patient Details Name: Jeffrey Blair MRN: [id number] DOB: April 19, 1928 Today's Date: 07/27/2020    History of Present Illness 86 yo male admitted to [facility] from [place] for AMS. CTA demonstrates R proximal MCA posterior division occlusion with distal reconstitution, severe stenosis of proximal basilar and P2 of R PCA. MRI Head 07/27/20 demonstrates acute to subacute infarct in the posterior right MCA territory  infarct, and especially at the right MCA/PCA watershed. Pt and family left AMA, returned. PMH includes parkinson's disease, CVA, prostate cancer, TIA, migraines, cervical disc disease, dementia.    PT Comments    Pt restless upon arrival to room with LEs over EOB. Pt overall requiring mod +2 assist for bed mobility, transfers, and short-distance gait in room. Pt demonstrating difficulty with command following during mobility, benefits form multimodal, short cues for safety. Pt with questionable visual deficits and reports he wears glasses, per sitter pt's family is bringing glasses sometime today. PT continuing to recommend SNF level of care post-acutely, will continue to follow.    Follow Up Recommendations  SNF;Supervision/Assistance - 24 hour;Supervision for mobility/OOB     Equipment Recommendations  None recommended by PT    Recommendations for Other Services       Precautions / Restrictions Precautions Precautions: Fall Restrictions Weight Bearing Restrictions: No    Mobility  Bed Mobility Overal bed mobility: Needs Assistance Bed Mobility: Supine to Sit     Supine to sit: Min assist;+2 for safety/equipment     General bed mobility comments: min assist +2 for trunk elevation and scooting to EOB  Transfers Overall transfer level: Needs assistance Equipment used: 2 person hand held assist Transfers: Sit to/from Stand Sit to Stand: Mod assist;+2 physical assistance;From elevated surface         General transfer  comment: mod +2 for initial power up, rise, and steady. Repeated sit to stands as intervention, pt demonstrating forward sliding of LEs when rising and sitting which requires LE blocking  Ambulation/Gait Ambulation/Gait assistance: Mod assist;+2 physical assistance Gait Distance (Feet): 5 Feet Assistive device: 2 person hand held assist Gait Pattern/deviations: Step-through pattern;Decreased stride length;Shuffle;Trunk flexed;Narrow base of support Gait velocity: decr   General Gait Details: Mod +2 for HHA, steadying, physically guiding pt trajectory. Pt reaching window in room and quickly letting go of PT hand pt reaches for windowsill and props to rest.   Stairs             Wheelchair Mobility    Modified Rankin (Stroke Patients Only) Modified Rankin (Stroke Patients Only) Pre-Morbid Rankin Score: Moderate disability Modified Rankin: Moderately severe disability     Balance Overall balance assessment: Needs assistance Sitting-balance support: Feet supported;No upper extremity supported Sitting balance-Leahy Scale: Poor Sitting balance - Comments: intermittent min assist to correct posterior preference Postural control: Posterior lean Standing balance support: During functional activity;Bilateral upper extremity supported Standing balance-Leahy Scale: Poor Standing balance comment: reliant on external support in standing                            Cognition Arousal/Alertness: Awake/alert Behavior During Therapy: Restless;Impulsive;Anxious Overall Cognitive Status: History of cognitive impairments - at baseline                                 General Comments: Baseline dementia, pt oriented to self but not location. Requires frequent verbal and tactile cues for safety  during mobility, pt responds literally to all verbal cues (for example: "lean forward" to remove gait belt - pt leans all the way to the floor)      Exercises General Exercises  - Lower Extremity Mini-Sqauts: AAROM;Both (x3 sit to stands as intervention from recliner, mod +2 to block LEs from sliding anteriorly, rocking trunk anteriorly, rise and steady)    General Comments General comments (skin integrity, edema, etc.): ? visual impairment - per sitter in room pt "only has peripheral vision", demonstrates poor visual locking      Pertinent Vitals/Pain Pain Assessment: No/denies pain    Home Living                      Prior Function            PT Goals (current goals can now be found in the care plan section) Acute Rehab PT Goals Patient Stated Goal: return home PT Goal Formulation: With patient/family Time For Goal Achievement: 08/09/20 Potential to Achieve Goals: Good Progress towards PT goals: Progressing toward goals    Frequency    Min 3X/week      PT Plan Current plan remains appropriate    Co-evaluation PT/OT/SLP Co-Evaluation/Treatment: Yes Reason for Co-Treatment: For patient/therapist safety;To address functional/ADL transfers;Necessary to address cognition/behavior during functional activity PT goals addressed during session: Mobility/safety with mobility;Balance;Strengthening/ROM        AM-PAC PT "6 Clicks" Mobility   Outcome Measure  Help needed turning from your back to your side while in a flat bed without using bedrails?: A Little Help needed moving from lying on your back to sitting on the side of a flat bed without using bedrails?: A Little Help needed moving to and from a bed to a chair (including a wheelchair)?: A Lot Help needed standing up from a chair using your arms (e.g., wheelchair or bedside chair)?: A Lot Help needed to walk in hospital room?: A Lot Help needed climbing 3-5 steps with a railing? : A Lot 6 Click Score: 14    End of Session Equipment Utilized During Treatment: Gait belt Activity Tolerance: Patient limited by fatigue;Patient tolerated treatment well Patient left: with call bell/phone  within reach;with family/visitor present;in chair;with nursing/sitter in room (sitter in room, chair alarm placed but sitter states he does not need alarm since he has Designer, multimedia) Nurse Communication: Mobility status PT Visit Diagnosis: Unsteadiness on feet (R26.81);Other abnormalities of gait and mobility (R26.89);Muscle weakness (generalized) (M62.81)     Time: 3545-6256 PT Time Calculation (min) (ACUTE ONLY): 16 min  Charges:  $Therapeutic Activity: 8-22 mins                     Marye Round, PT Acute Rehabilitation Services Pager [phone]  Office [phone]   Tyrone Apple E Christain [place] 07/27/2020, 12:16 PM

## 2020-07-27 NOTE — Assessment & Plan Note (Addendum)
-   dys 1 diet started on 12/28; passed SLP eval for now; will need ongoing assessment given large stroke -Patient on Sinemet at home and primidone

## 2020-07-28 DIAGNOSIS — G20 Parkinson's disease: Secondary | ICD-10-CM | POA: Diagnosis not present

## 2020-07-28 MED ORDER — LACTATED RINGERS IV SOLN: INTRAVENOUS | Status: DC

## 2020-07-28 NOTE — Progress Notes (Signed)
RE: Jeffrey Blair DOB: [birth year] Date: 07/28/2020  To Whom It May Concern:  Please be advised that the above-named patient has a primary diagnosis of dementia which supersedes any psychiatric diagnosis.

## 2020-07-28 NOTE — Progress Notes (Signed)
PROGRESS NOTE    BRONSEN TAJIMA   [id number]  DOB: December 30, 1927  DOA: 07/25/2020     1  PCP: [facility]  CC: Change in behavior, balance, speech  Hospital Course: Mr. Mctiernan is a 86 yo male with PMH Parkinson's disease, dementia, debility, hx cerebellar CVA (seen on imaging but u/k to family until now), cervical DDD who was brought to the hospital for worsening balance/mobility, worsening confusion, and dysarthria. Some work-up had been initiated at [place], however patient was signed out AMA by family then brought to [facility] and ultimately now at [facility].  His symptoms started on 07/25/2020. His son noted that he already had difficulty walking in the past but this has become worsened lately. MRI brain performed on admission showed chronic cerebellar infarcts.  There was also chronic bilateral white matter changes that had progressed since 2017.  MRI also showed an acute widespread infarct involving approximately a 7 cm area of the right MCA/PCA territory.  This likely explained the patient's severe decompensation with poor reserve to withstand this level stroke. He also failed a swallow eval performed on 07/27/2020 with speech therapy.  He was also only able to walk 5 feet with physical therapy requiring 2 person max assist. His findings were discussed with his son on the phone especially regarding decisions needing to be made regarding nutrition in the next coming days.  Plan was to monitor patient's progress or possible decline then make further decisions.   Interval History:  No events overnight.  Patient seen still fidgeting in bed and restless.  Sitter remains at bedside as well. Called and updated [name] again this afternoon. They have elected for DNR and this has been updated. The patient also has 8 children locally and they are hoping to see patient soon in case he declines rapidly; I have spoke to charge RN about this and I believe this request  is appropriate and able to be accommodated for more family to visit (2 visitors at time, but can swap out).   Old records reviewed in assessment of this patient  ROS: Review of systems not obtained due to patient factors.  Cognitive impairment  Assessment & Plan: Acute CVA (cerebrovascular accident) ([place]) - per MRI brain: "Widespread patchy and confluent restricted diffusion in the posterior right MCA territory relatively sparing the superior perirolandic cortex. Patchy white matter and gray matter involvement, including the posterior right insula. The infarct is most confluent in a 7 cm area of the the right MCA/PCA watershed territory (series 5, image 72). No deep gray nuclei involvement." - given age and underlying dementia/comorbidites patient prognosis is poor; I have discussed this on the phone in detail with his son, [name] on 12/27 and 12/28 (family has elected for DNR at this time while watching patient progress) - continue asa/plavix for now - continue PT/OT/SLP (poor performance with all) - mild improvement in terms of swallow function (passed and being started on dysphagia 1 diet on 12/28); still believe he is a poor candidate for PEG tube and I have also discussed that with his son - IVF for now til PO better - haldol and ativan for any agitation/discomfort - continue sitter for safety  -Discussed status on phone with son on 07/28/2020.  Family has all talked (patient has 8 children) and at this time they are wishing to change status to DNR.  Parkinson disease ([place]) - dys 1 diet started on 12/28; passed SLP eval for now; will need ongoing assessment  given large stroke -Patient on Sinemet at home and primidone   Normocytic anemia -No benefit in aggressive work-up; hemoglobin relatively stable, 12 g/dL  Hypertension -Labetalol as needed  Hyperlipidemia -Simvastatin ordered, still unsafe for pills; furthermore likely to have no mortality benefit at this point  COPD (chronic  obstructive pulmonary disease) ([place]) -No signs or symptoms of exacerbation -Continue Incruse Ellipta   Antimicrobials: n/a  DVT prophylaxis: SCD Code Status: DNR Family Communication: son, [name] on phone Disposition Plan: Status is: Inpatient  Remains inpatient appropriate because:Altered mental status, Unsafe d/c plan and Inpatient level of care appropriate due to severity of illness   Dispo: The patient is from: Home              Anticipated d/c is to: pending clinical course              Anticipated d/c date is: > 3 days              Patient currently is not medically stable to d/c.  Objective: Blood pressure 132/68, pulse 81, temperature 98.3 F (36.8 C), temperature source Axillary, resp. rate 15, height 6\' 1"  (1.854 m), weight 72.6 kg, SpO2 95 %.  Examination: General appearance: Chronically ill-appearing elderly gentleman moving around in bed impulsively but does follow some commands at times Head: Normocephalic, without obvious abnormality Eyes: EOMI Lungs: clear to auscultation bilaterally Heart: regular rate and rhythm and S1, S2 normal Abdomen: normal findings: bowel sounds normal and soft, non-tender Extremities: Irregular left leg appreciated with asymmetric edema consistent with probable previous leg fracture Skin: mobility and turgor normal Neurologic: Raised arms and legs to command.  Speech very dysarthric and possible component of aphasia.  Gait deferred but unsteady with PT Psych: Rapid speech  Consultants:   Neuro  Procedures:     Data Reviewed: I have personally reviewed following labs and imaging studies No results found for this or any previous visit (from the past 24 hour(s)).  Recent Results (from the past 240 hour(s))  Resp Panel by RT-PCR (Flu A&B, Covid) Nasopharyngeal Swab     Status: None   Collection Time: 07/25/20 11:10 PM   Specimen: Nasopharyngeal Swab; Nasopharyngeal(NP) swabs in vial transport medium  Result Value Ref Range  Status   SARS Coronavirus 2 by RT PCR NEGATIVE NEGATIVE Final    Comment: (NOTE) SARS-CoV-2 target nucleic acids are NOT DETECTED.  The SARS-CoV-2 RNA is generally detectable in upper respiratory specimens during the acute phase of infection. The lowest concentration of SARS-CoV-2 viral copies this assay can detect is 138 copies/mL. A negative result does not preclude SARS-Cov-2 infection and should not be used as the sole basis for treatment or other patient management decisions. A negative result may occur with  improper specimen collection/handling, submission of specimen other than nasopharyngeal swab, presence of viral mutation(s) within the areas targeted by this assay, and inadequate number of viral copies(<138 copies/mL). A negative result must be combined with clinical observations, patient history, and epidemiological information. The expected result is Negative.  Fact Sheet for Patients:  EntrepreneurPulse.com.au  Fact Sheet for Healthcare Providers:  IncredibleEmployment.be  This test is no t yet approved or cleared by the [place] FDA and  has been authorized for detection and/or diagnosis of SARS-CoV-2 by FDA under an Emergency Use Authorization (EUA). This EUA will remain  in effect (meaning this test can be used) for the duration of the COVID-19 declaration under Section 564(b)(1) of the Act, 21 U.S.C.section 360bbb-3(b)(1), unless the authorization is terminated  or revoked sooner.       Influenza A by PCR NEGATIVE NEGATIVE Final   Influenza B by PCR NEGATIVE NEGATIVE Final    Comment: (NOTE) The Xpert Xpress SARS-CoV-2/FLU/RSV plus assay is intended as an aid in the diagnosis of influenza from Nasopharyngeal swab specimens and should not be used as a sole basis for treatment. Nasal washings and aspirates are unacceptable for Xpert Xpress SARS-CoV-2/FLU/RSV testing.  Fact Sheet for  Patients: EntrepreneurPulse.com.au  Fact Sheet for Healthcare Providers: IncredibleEmployment.be  This test is not yet approved or cleared by the [place] FDA and has been authorized for detection and/or diagnosis of SARS-CoV-2 by FDA under an Emergency Use Authorization (EUA). This EUA will remain in effect (meaning this test can be used) for the duration of the COVID-19 declaration under Section 564(b)(1) of the Act, 21 U.S.C. section 360bbb-3(b)(1), unless the authorization is terminated or revoked.  Performed at [facility], [address] [place], [place] [address]      Radiology Studies: MR BRAIN WO CONTRAST  Result Date: 07/27/2020 CLINICAL DATA:  86 year old male with slurred speech and neurologic deficit since 07/25/2020. Left AMA from [facility] waiting for admission. EXAM: MRI HEAD WITHOUT CONTRAST TECHNIQUE: Multiplanar, multiecho pulse sequences of the brain and surrounding structures were obtained without intravenous contrast. COMPARISON:  Brain MRI and MRA 01/25/2016. FINDINGS: Brain: Widespread patchy and confluent restricted diffusion in the posterior right MCA territory relatively sparing the superior perirolandic cortex. Patchy white matter and gray matter involvement, including the posterior right insula. The infarct is most confluent in a 7 cm area of the the right MCA/PCA watershed territory (series 5, image 72). No deep gray nuclei involvement. Cytotoxic edema with confluent T2 and FLAIR hyperintensity. Some of the affected parenchyma is T1 hypointense. Questionable petechial hemorrhage on SWI but no malignant hemorrhagic transformation. No mass effect. No restricted diffusion elsewhere. No midline shift, mass effect, evidence of mass lesion, ventriculomegaly, extra-axial collection. No extra-axial or intraventricular blood. Cervicomedullary junction and pituitary are within normal limits. Patchy chronic bilateral  white matter T2 and FLAIR hyperintensity in both hemispheres has mildly progressed since 2017. No definite chronic cortical encephalomalacia. Subtle chronic infarcts in the cerebellum, including series 14, image 3 on the left which is new since 2017. Occasional cerebellar microhemorrhages in the left hemisphere. Vascular: Chronically poor distal vertebral artery flow voids. Evidence of reconstituted flow in the basilar, stable since 2017. Fetal type PCA origins demonstrated previously. ICA flow voids appears stable. Skull and upper cervical spine: Chronic cervical spine degeneration with mild if any visible spinal stenosis. Background bone marrow signal within normal limits. Sinuses/Orbits: Negative orbits. Mild paranasal sinus mucosal thickening not significantly changed from 2017. Other: Mastoids remain clear. Visible internal auditory structures appear normal. Negative scalp and face soft tissues. IMPRESSION: 1. Acute to subacute infarct in the posterior right MCA territory infarct, and especially at the right MCA/PCA watershed (fetal type PCA origins, see #2). Cytotoxic edema with possible petechial hemorrhage but no malignant hemorrhagic transformation or mass effect. 2. Chronically poor distal vertebral artery flow with evidence of reconstituted basilar, grossly stable since 2017. 3. Mild progression of underlying chronic small vessel disease since 2017. Electronically Signed   By: Genevie Ann M.D.   On: 07/27/2020 05:45   MR BRAIN WO CONTRAST  Final Result    US Carotid Bilateral (at [facility] and [facility] only)  Final Result      Scheduled Meds: . aspirin EC  81 mg Oral Daily  . carbidopa-levodopa  3 tablet Oral BID  . clopidogrel  75 mg Oral Daily  . pantoprazole  40 mg Oral Daily  . primidone  150 mg Oral BID  . simvastatin  20 mg Oral QPM  . umeclidinium bromide  1 puff Inhalation Daily  . venlafaxine  37.5 mg Oral BID  . vitamin B-12  1,000 mcg Oral Daily   PRN Meds: acetaminophen **OR**  acetaminophen, albuterol, haloperidol lactate, labetalol, loratadine, LORazepam, meclizine, ondansetron **OR** ondansetron (ZOFRAN) IV, polyvinyl alcohol, traMADol Continuous Infusions: . lactated ringers       LOS: 1 day  Time spent: Greater than 50% of the 35 minute visit was spent in counseling/coordination of care for the patient as laid out in the A&P.   Lewie Chamber, MD [facility] 07/28/2020, 1:42 PM

## 2020-07-28 NOTE — Progress Notes (Signed)
  Speech Language Pathology Treatment: Dysphagia  Patient Details Name: Jeffrey Blair MRN: [id number] DOB: 1927-10-15 Today's Date: 07/28/2020 Time: 8850-2774 SLP Time Calculation (min) (ACUTE ONLY): 18 min  Assessment / Plan / Recommendation Clinical Impression  SLP followed up for PO readiness. Pt alert, pleasantly confused at bedside. Pt son present during part of session, assisted with gathering baseline/PLOF information. Pt required some cueing for oral acceptance of POs and feeding assistance. Oral motor exam revealed left sided weakness including L facial droop, reduced L facial sensation, decreased L lingual and labial strength. This allowed for intermittent left sided anterior spillage with POs. Pt with inadequate mastication of solid with left sided pocketing and extraction of unchewed solid from oral cavity by SLP. No overt s/sx of aspiration with any POs including 3 oz water challenge. Recommend dysphagia 1 (puree) and thin liquids with medicines crushed in puree and full supervision with all POs. SLP to closely monitor as pt risk for silent aspiration increased in setting of PD and CVAs.     HPI HPI: Jeffrey Blair is a 86 y.o. male with medical history significant of prostate cancer in remission, history of other nonhemorrhagic CVA, TIA, Parkinson's disease, migraine headaches, cervical disc disease, dementia who is brought to the emergency department by his son from [place] after he was taken due to AMS secondary to acute other nonhemorrhagic CVA. MRI Head 07/27/20: Acute to subacute infarct in the posterior right MCA territory  infarct, and especially at the right MCA/PCA watershed (fetal type PCA origins)      SLP Plan  Continue with current plan of care       Recommendations  Diet recommendations: Dysphagia 1 (puree);Thin liquid Liquids provided via: Cup;Straw Medication Administration: Crushed with puree Supervision: Full supervision/cueing for compensatory  strategies;Staff to assist with self feeding Compensations: Slow rate;Minimize environmental distractions;Small sips/bites;Follow solids with liquid Postural Changes and/or Swallow Maneuvers: Seated upright 90 degrees;Upright 30-60 min after meal                Oral Care Recommendations: Oral care BID Follow up Recommendations: Skilled Nursing facility;24 hour supervision/assistance SLP Visit Diagnosis: Dysphagia, oral phase (R13.11);Dysphagia, unspecified (R13.10) Plan: Continue with current plan of care       GO                Avalina Benko E Keilly Fatula MA, CCC-SLP Acute Rehabilitation Services  07/28/2020, 12:06 PM

## 2020-07-28 NOTE — Evaluation (Signed)
Speech Language Pathology Evaluation Patient Details Name: Jeffrey Blair MRN: [id number] DOB: Apr 17, 1928 Today's Date: 07/28/2020 Time: 1135-1200 SLP Time Calculation (min) (ACUTE ONLY): 25 min  Problem List:  Patient Active Problem List   Diagnosis Date Noted  . CVA (cerebral vascular accident) (HCC) 07/27/2020  . Cerebrovascular accident (CVA) due to other mechanism (HCC) 07/26/2020  . Hypertension 07/26/2020  . Normocytic anemia 07/26/2020  . Acute CVA (cerebrovascular accident) (HCC) 07/26/2020  . Cerebrovascular disease 01/25/2016  . Hyperlipidemia 01/25/2016  . Cervical disc disease 01/25/2016  . Elevated blood pressure 01/25/2016  . TIA (transient ischemic attack) 01/24/2016  . Parkinson disease (HCC)   . Cancer (HCC)   . Abdominal pain 05/21/2015  . COPD (chronic obstructive pulmonary disease) (HCC) 04/08/2015  . Cough 04/08/2015  . EARLY SATIETY 05/11/2009  . NAUSEA 05/06/2009  . NAUSEA WITH VOMITING 01/13/2009  . Abdominal pain, other specified site 01/13/2009  . MELENA, HX OF 01/13/2009   Past Medical History:  Past Medical History:  Diagnosis Date  . Cancer (HCC)    prostate-in remission  . Cerebrovascular disease 01/25/2016  . Cervical disc disease 01/25/2016  . Dementia (HCC)   . Migraine   . Parkinson disease (HCC)   . Stroke (HCC)   . TIA (transient ischemic attack) 01/24/2016   Past Surgical History: History reviewed. No pertinent surgical history. HPI:  Jeffrey Blair is a 86 y.o. male with medical history significant of prostate cancer in remission, history of other nonhemorrhagic CVA, TIA, Parkinson's disease, migraine headaches, cervical disc disease, dementia who is brought to the emergency department by his son from [place] after he was taken due to AMS secondary to acute other nonhemorrhagic CVA. MRI Head 07/27/20: Acute to subacute infarct in the posterior right MCA territory  infarct, and especially at the right MCA/PCA watershed (fetal type  PCA origins)   Assessment / Plan / Recommendation Clinical Impression  Pt presents with novel dysarthria of speech and worsening cognitive deficits. Pt son present during speech language evaluation. Per son, at baseline pt received help from family for complex ADLs but could do some simple ADLs "cook an egg" or have unsupervised time during the day independently. Pt oriented to self only, appeared to have right gaze preference. Pt able to follow some simple commands with cueing. Impairments noted in problem solving, attention, recall, executive function, and orientation. Dysarthria of speech c/b reduced vocal intensity and imprecise articulation reducing intelligibility of speech. Pt will require 24 hour supervision. SLP to follow up for deficits mentioned above.    SLP Assessment  SLP Recommendation/Assessment: Patient needs continued Speech Lanaguage Pathology Services SLP Visit Diagnosis: Dysarthria and anarthria (R47.1);Cognitive communication deficit (R41.841)    Follow Up Recommendations  Skilled Nursing facility;24 hour supervision/assistance    Frequency and Duration min 2x/week  1 week      SLP Evaluation Cognition  Overall Cognitive Status: Impaired/Different from baseline Arousal/Alertness: Awake/alert Orientation Level: Oriented to person;Disoriented to place;Disoriented to time;Disoriented to situation Attention: Focused Focused Attention: Impaired Focused Attention Impairment: Verbal basic;Functional basic Memory: Impaired Awareness: Impaired Problem Solving: Impaired Executive Function: Organizing;Self Monitoring Organizing: Impaired Self Monitoring: Impaired Behaviors: Restless Safety/Judgment: Impaired       Comprehension  Auditory Comprehension Overall Auditory Comprehension: Other (comment) (at baseline) Commands: Impaired One Step Basic Commands: 75-100% accurate Two Step Basic Commands: 50-74% accurate Interfering Components: Processing speed     Expression Expression Primary Mode of Expression: Verbal Verbal Expression Overall Verbal Expression: Appears within functional limits for tasks assessed Written Expression  Dominant Hand: Right   Oral / Motor  Oral Motor/Sensory Function Overall Oral Motor/Sensory Function: Moderate impairment Facial ROM: Reduced left Facial Symmetry: Abnormal symmetry left Facial Strength: Reduced left Facial Sensation: Reduced left Lingual ROM: Reduced left Lingual Symmetry: Abnormal symmetry left Lingual Strength: Reduced Motor Speech Overall Motor Speech: Impaired Respiration: Impaired Level of Impairment: Phrase Phonation: Low vocal intensity Articulation: Impaired Intelligibility: Intelligibility reduced Motor Planning: Witnin functional limits Effective Techniques: Slow rate;Increased vocal intensity   GO                    Mattea Seger E Bryson Gavia MA, CCC-SLP Acute Rehabilitation Services 07/28/2020, 12:34 PM

## 2020-07-28 NOTE — TOC Progression Note (Signed)
Transition of Care [facility]) - Progression Note    Patient Details  Name: Jeffrey Blair MRN: [id number] Date of Birth: [birth year]  [facility] CM/SW Contact  Pollie Friar, RN Phone Number: 07/28/2020, 1:45 PM  Clinical Narrative:    CM met with one of patients sons at the bedside and spoke to [place] over the phone in the room. CM went over the recommendations for SNF. Family wants to discuss this and will update CM.  TOC following.   Expected Discharge Plan: [place] Barriers to Discharge: Continued Medical Work up  Expected Discharge Plan and Services Expected Discharge Plan: [place] In-house Referral: Clinical Social Work   Post Acute Care Choice: [place] Living arrangements for the past 2 months: Single Family Home                                       Social Determinants of Health (SDOH) Interventions    Readmission Risk Interventions No flowsheet data found.

## 2020-07-28 NOTE — Plan of Care (Signed)
  Problem: Safety: Goal: Ability to remain free from injury will improve Outcome: Progressing   Problem: Ischemic Stroke/TIA Tissue Perfusion: Goal: Complications of ischemic stroke/TIA will be minimized Outcome: Progressing

## 2020-07-29 DIAGNOSIS — G20 Parkinson's disease: Secondary | ICD-10-CM | POA: Diagnosis not present

## 2020-07-29 MED ORDER — HALOPERIDOL LACTATE 5 MG/ML IJ SOLN
1.0000 mg | Freq: Four times a day (QID) | INTRAMUSCULAR | Status: DC | PRN
  Administered 2020-07-30 – 2020-08-22 (×5): 1 mg via INTRAVENOUS
  Filled 2020-07-29 (×5): qty 1

## 2020-07-29 MED ORDER — LORAZEPAM 2 MG/ML IJ SOLN: 0.5000 mg | INTRAMUSCULAR | Status: DC | PRN

## 2020-07-29 NOTE — Progress Notes (Signed)
PROGRESS NOTE    Jeffrey Blair   QQV:[id number]  DOB: 1927/08/04  DOA: 07/25/2020     2  PCP: [facility]  CC: Change in behavior, balance, speech  Hospital Course: [name] is a 86 yo male with PMH Parkinson's disease, dementia, debility, hx cerebellar CVA (seen on imaging but u/k to family until now), cervical DDD who was brought to the hospital for worsening balance/mobility, worsening confusion, and dysarthria.   Was taken to  [facility] ER, patient got agitated had a very long wait time,  signed out AMA by family then brought to [facility] the following day and ultimately now at [facility].   -MRI brain performed on admission showed chronic cerebellar infarcts.  There was also chronic bilateral white matter changes that had progressed since 2017.  MRI also showed an acute widespread infarct involving approximately a 7 cm area of the right MCA/PCA territory.  He also failed a swallow eval performed on 07/27/2020 with speech therapy.  He was also only able to walk 5 feet with physical therapy requiring 2 person max assist. -on 12/28: discussion between Dr.Girgaus with family, decision made for DNR  Interval History:  -Sleeping most of the day, was able to eat a few bites for breakfast  Assessment & Plan:  Acute CVA (cerebrovascular accident) (HCC) - per MRI brain: "Widespread patchy and confluent restricted diffusion in the posterior right MCA territory. The infarct is most confluent in a 7 cm area of the the right MCA/PCA watershed territory " - given age and underlying dementia/comorbidites patient prognosis was felt to be poor, Dr.Girgaus discussed this on the phone in detail with his son, Jeffrey Blair on 12/27 and 12/28 (family has elected for DNR) - Tele neurology at [facility] recommended -asa/plavix, will review with Neuro team here given size of CVA - continue PT/OT/SLP - mild improvement in terms of swallow function (passed and being started on dysphagia 1 diet  on 12/28) - sitter for safety, cut down IV fluids -if stabilizes will consider Rehab with palliative care follow-up  Parkinson disease (HCC) Dementia - dys 1 diet started on 12/28; passed SLP eval for now;  -Continue to monitor for tolerance -Patient on Sinemet at home and primidone   Normocytic anemia -No benefit in aggressive work-up; hemoglobin relatively stable, 12 g/dL  Hypertension -Labetalol as needed  Hyperlipidemia -Simvastatin ordered, still unsafe for pills; furthermore likely to have no mortality benefit at this point  COPD (chronic obstructive pulmonary disease) (HCC) -No signs or symptoms of exacerbation -Continue Incruse Ellipta   DVT prophylaxis: SCD Code Status: DNR Family Communication: No family at bedside, will update son Disposition Plan: Status is: Inpatient  Remains inpatient appropriate because:Altered mental status, Unsafe d/c plan and Inpatient level of care appropriate due to severity of illness   Dispo: The patient is from: Home              Anticipated d/c is to: SNF              Anticipated d/c date is: > Possibly 48 hours if stabilizes              Patient currently is not medically stable to d/c.  Objective: Blood pressure 115/72, pulse 84, temperature 98.8 F (37.1 C), temperature source Oral, resp. rate 14, height 6\' 1"  (1.854 m), weight 72.6 kg, SpO2 100 %.  Examination:  Gen: Awake, alert, impulsive, oriented to self only, intermittently follows commands, confused HEENT:no JVD Lungs: Diminished at the bases  otherwise clear bilaterally CVS: S1-S2, regular rate rhythm Abdomen: Soft, nontender, bowel sounds present Extremities: Left lower leg with soft tissue swelling, deformity likely from prior injury skin: No rashes on exposed skin Neuro: Awake alert, oriented to self only, intermittently follows commands, moved both lower extremities   Consultants:   Neuro  Procedures:     Data Reviewed: I have personally reviewed  following labs and imaging studies No results found for this or any previous visit (from the past 24 hour(s)).  Recent Results (from the past 240 hour(s))  Resp Panel by RT-PCR (Flu A&B, Covid) Nasopharyngeal Swab     Status: None   Collection Time: 07/25/20 11:10 PM   Specimen: Nasopharyngeal Swab; Nasopharyngeal(NP) swabs in vial transport medium  Result Value Ref Range Status   SARS Coronavirus 2 by RT PCR NEGATIVE NEGATIVE Final    Comment: (NOTE) SARS-CoV-2 target nucleic acids are NOT DETECTED.  The SARS-CoV-2 RNA is generally detectable in upper respiratory specimens during the acute phase of infection. The lowest concentration of SARS-CoV-2 viral copies this assay can detect is 138 copies/mL. A negative result does not preclude SARS-Cov-2 infection and should not be used as the sole basis for treatment or other patient management decisions. A negative result may occur with  improper specimen collection/handling, submission of specimen other than nasopharyngeal swab, presence of viral mutation(s) within the areas targeted by this assay, and inadequate number of viral copies(<138 copies/mL). A negative result must be combined with clinical observations, patient history, and epidemiological information. The expected result is Negative.  Fact Sheet for Patients:  EntrepreneurPulse.com.au  Fact Sheet for Healthcare Providers:  IncredibleEmployment.be  This test is no t yet approved or cleared by the [place] FDA and  has been authorized for detection and/or diagnosis of SARS-CoV-2 by FDA under an Emergency Use Authorization (EUA). This EUA will remain  in effect (meaning this test can be used) for the duration of the COVID-19 declaration under Section 564(b)(1) of the Act, 21 U.S.C.section 360bbb-3(b)(1), unless the authorization is terminated  or revoked sooner.       Influenza A by PCR NEGATIVE NEGATIVE Final   Influenza B by PCR  NEGATIVE NEGATIVE Final    Comment: (NOTE) The Xpert Xpress SARS-CoV-2/FLU/RSV plus assay is intended as an aid in the diagnosis of influenza from Nasopharyngeal swab specimens and should not be used as a sole basis for treatment. Nasal washings and aspirates are unacceptable for Xpert Xpress SARS-CoV-2/FLU/RSV testing.  Fact Sheet for Patients: EntrepreneurPulse.com.au  Fact Sheet for Healthcare Providers: IncredibleEmployment.be  This test is not yet approved or cleared by the [place] FDA and has been authorized for detection and/or diagnosis of SARS-CoV-2 by FDA under an Emergency Use Authorization (EUA). This EUA will remain in effect (meaning this test can be used) for the duration of the COVID-19 declaration under Section 564(b)(1) of the Act, 21 U.S.C. section 360bbb-3(b)(1), unless the authorization is terminated or revoked.  Performed at [facility], [address] [place], [place] [address]      Radiology Studies: No results found. MR BRAIN WO CONTRAST  Final Result    US Carotid Bilateral (at [facility] and [facility] only)  Final Result      Scheduled Meds: . aspirin EC  81 mg Oral Daily  . carbidopa-levodopa  3 tablet Oral BID  . clopidogrel  75 mg Oral Daily  . pantoprazole  40 mg Oral Daily  . primidone  150 mg Oral BID  . simvastatin  20 mg Oral  QPM  . umeclidinium bromide  1 puff Inhalation Daily  . venlafaxine  37.5 mg Oral BID  . vitamin B-12  1,000 mcg Oral Daily   PRN Meds: acetaminophen **OR** acetaminophen, albuterol, haloperidol lactate, labetalol, loratadine, LORazepam, meclizine, ondansetron **OR** ondansetron (ZOFRAN) IV, polyvinyl alcohol, traMADol Continuous Infusions:    LOS: 2 days  Time spent:  Zannie Cove, MD [facility] 07/29/2020, 11:57 AM

## 2020-07-29 NOTE — NC FL2 (Signed)
[place] LEVEL OF CARE SCREENING TOOL     IDENTIFICATION  Patient Name: Jeffrey Blair Birthdate: 05/10/1928 Sex: male Admission Date (Current Location): 07/25/2020  [facility] and [place] Number:  [employer] and Address:  The [facility]. [facility], [place] [address] [place], [place] [address]      Provider Number: [id number]  Attending Physician Name and Address:  Domenic Polite, MD  Relative Name and Phone Number:       Current Level of Care: Hospital Recommended Level of Care: [place] Prior Approval Number:    Date Approved/Denied:   PASRR Number: pending  Discharge Plan: SNF    Current Diagnoses: Patient Active Problem List   Diagnosis Date Noted  . CVA (cerebral vascular accident) ([place]) 07/27/2020  . Cerebrovascular accident (CVA) due to other mechanism ([place]) 07/26/2020  . Hypertension 07/26/2020  . Normocytic anemia 07/26/2020  . Acute CVA (cerebrovascular accident) ([place]) 07/26/2020  . Cerebrovascular disease 01/25/2016  . Hyperlipidemia 01/25/2016  . Cervical disc disease 01/25/2016  . Elevated blood pressure 01/25/2016  . TIA (transient ischemic attack) 01/24/2016  . Parkinson disease ([place])   . Cancer ([place])   . Abdominal pain 05/21/2015  . COPD (chronic obstructive pulmonary disease) ([place]) 04/08/2015  . Cough 04/08/2015  . EARLY SATIETY 05/11/2009  . NAUSEA 05/06/2009  . NAUSEA WITH VOMITING 01/13/2009  . Abdominal pain, other specified site 01/13/2009  . MELENA, HX OF 01/13/2009    Orientation RESPIRATION BLADDER Height & Weight     Self  Normal Incontinent Weight: 72.6 kg Height:  6\' 1"  (185.4 cm)  BEHAVIORAL SYMPTOMS/MOOD NEUROLOGICAL BOWEL NUTRITION STATUS      Continent Diet (dysphagia 1 with thin liquids)  AMBULATORY STATUS COMMUNICATION OF NEEDS Skin   Extensive Assist Verbally Normal                       Personal Care Assistance Level of Assistance  Bathing,Feeding,Dressing  Bathing Assistance: Maximum assistance Feeding assistance: Limited assistance Dressing Assistance: Maximum assistance     Functional Limitations Info  Sight,Hearing,Speech Sight Info: Impaired (Lt eye blind) Hearing Info: Adequate Speech Info: Impaired    SPECIAL CARE FACTORS FREQUENCY  PT (By licensed PT),OT (By licensed OT),Speech therapy     PT Frequency: 5x/wk OT Frequency: 5x/wk     Speech Therapy Frequency: 5x/wk      Contractures Contractures Info: Not present    Additional Factors Info  Code Status,Allergies,Psychotropic Code Status Info: Full Allergies Info: Morphine/ Penicillin Psychotropic Info: Sinemet IR 25-100 3 tabs twice a day/ Effexor 37.5 mg BID         Current Medications (07/29/2020):  This is the current hospital active medication list Current Facility-Administered Medications  Medication Dose Route Frequency Provider Last Rate Last Admin  . acetaminophen (TYLENOL) tablet 650 mg  650 mg Oral Q6H PRN Reubin Milan, MD       Or  . acetaminophen (TYLENOL) suppository 650 mg  650 mg Rectal Q6H PRN Reubin Milan, MD   650 mg at 07/27/20 2046  . albuterol (VENTOLIN HFA) 108 (90 Base) MCG/ACT inhaler 2 puff  2 puff Inhalation Q6H PRN Reubin Milan, MD      . aspirin EC tablet 81 mg  81 mg Oral Daily Reubin Milan, MD   81 mg at 07/29/20 1004  . carbidopa-levodopa (SINEMET IR) 25-100 MG per tablet immediate release 3 tablet  3 tablet Oral BID Reubin Milan, MD  3 tablet at 07/29/20 1005  . clopidogrel (PLAVIX) tablet 75 mg  75 mg Oral Daily Bobette Mo, MD   75 mg at 07/29/20 1005  . haloperidol lactate (HALDOL) injection 1 mg  1 mg Intravenous Q6H PRN Zannie Cove, MD      . labetalol (NORMODYNE) injection 10 mg  10 mg Intravenous Q2H PRN Bobette Mo, MD      . loratadine (CLARITIN) tablet 10 mg  10 mg Oral Daily PRN Bobette Mo, MD      . LORazepam (ATIVAN) injection 0.5 mg  0.5 mg Intravenous Q4H  PRN Zannie Cove, MD      . meclizine (ANTIVERT) tablet 25 mg  25 mg Oral TID PRN Bobette Mo, MD      . ondansetron [facility]) tablet 4 mg  4 mg Oral Q6H PRN Bobette Mo, MD       Or  . ondansetron [facility]) injection 4 mg  4 mg Intravenous Q6H PRN Bobette Mo, MD      . pantoprazole (PROTONIX) EC tablet 40 mg  40 mg Oral Daily Bobette Mo, MD   40 mg at 07/29/20 1007  . polyvinyl alcohol (LIQUIFILM TEARS) 1.4 % ophthalmic solution 1 drop  1 drop Both Eyes QID PRN Bobette Mo, MD      . primidone (MYSOLINE) tablet 150 mg  150 mg Oral BID Bobette Mo, MD   150 mg at 07/29/20 1007  . simvastatin (ZOCOR) tablet 20 mg  20 mg Oral QPM Bobette Mo, MD   20 mg at 07/28/20 1831  . umeclidinium bromide (INCRUSE ELLIPTA) 62.5 MCG/INH 1 puff  1 puff Inhalation Daily Bobette Mo, MD   1 puff at 07/29/20 0757  . venlafaxine (EFFEXOR) tablet 37.5 mg  37.5 mg Oral BID Bobette Mo, MD   37.5 mg at 07/29/20 1005  . vitamin B-12 (CYANOCOBALAMIN) tablet 1,000 mcg  1,000 mcg Oral Daily Bobette Mo, MD   1,000 mcg at 07/29/20 1006     Discharge Medications: Please see discharge summary for a list of discharge medications.  Relevant Imaging Results:  Relevant Lab Results:   Additional Information SS#: [id number]  Kermit Balo, RN

## 2020-07-29 NOTE — Progress Notes (Signed)
Physical Therapy Treatment Patient Details Name: Jeffrey Blair MRN: [id number] DOB: [birth year] Today's Date: 07/29/2020    History of Present Illness 86 yo male admitted to [facility] from [place] for AMS. CTA demonstrates R proximal MCA posterior division occlusion with distal reconstitution, severe stenosis of proximal basilar and P2 of R PCA. MRI Head 07/27/20 demonstrates acute to subacute infarct in the posterior right MCA territory  infarct, and especially at the right MCA/PCA watershed. Pt and family left AMA, returned. PMH includes parkinson's disease, CVA, prostate cancer, TIA, migraines, cervical disc disease, dementia.    PT Comments    Pt displays possible L visual neglect as he tends to gaze towards his R and only looks to the L or rotates his head to the L when provided repeated cues to do so. Utilized glasses during session to attempt to improve vision this date. Pt impulsive and exaggerates movements when provided cues with all functional mob. For example, when cued to take a step to the L to transfer to the chair he took a large step and even stepped on the tech's feet, with min acknowledgement of this occurring, further supporting possible poor vision and L side neglect. Pt also displays poor motor control, proprioception, and L leg strength as he tends to lean laterally to the L when standing, but when trying to take a step with his R foot he then leans to the R and resists weight shifting to the L with his L knee flexing causing LOB and modAx2 to maintain balance and safety with ambulating ~8 ft with 2 HHA. Will continue to follow acutely. Current recommendations remain appropriate.    Follow Up Recommendations  SNF;Supervision/Assistance - 24 hour;Supervision for mobility/OOB     Equipment Recommendations  None recommended by PT    Recommendations for Other Services       Precautions / Restrictions Precautions Precautions: Fall Restrictions Weight Bearing  Restrictions: No    Mobility  Bed Mobility Overal bed mobility: Needs Assistance Bed Mobility: Supine to Sit     Supine to sit: Min assist;+2 for safety/equipment     General bed mobility comments: MinAx2 for safety with pt impulsive and bringing legs onto and off bed repeatedly.  Transfers Overall transfer level: Needs assistance Equipment used: 2 person hand held assist Transfers: Sit to/from [employer] Sit to Stand: Mod assist;+2 physical assistance;+2 safety/equipment Stand pivot transfers: Mod assist;+2 physical assistance;+2 safety/equipment       General transfer comment: ModAx2 with bilat HHA for initial power up, rise, and steady. Pt demonstrating forward sliding of L foot when rising and sitting which requires foot blocking and cues to correct with min success. When cued to step laterally to L to transfer to bedside chair pt took large step onto tech's foot with min acknowledgement.  Ambulation/Gait Ambulation/Gait assistance: Mod assist;+2 physical assistance;+2 safety/equipment Gait Distance (Feet): 8 Feet Assistive device: 2 person hand held assist Gait Pattern/deviations: Step-through pattern;Decreased stride length;Shuffle;Trunk flexed;Narrow base of support;Decreased weight shift to left;Decreased weight shift to right Gait velocity: decr Gait velocity interpretation: <1.31 ft/sec, indicative of household ambulator General Gait Details: ModAx2 with bilat HHA to ambulate several steps anteriorly and posteriorly repeatedly, total of ~8 ft. Pt tends to lean laterally to L but then refuses to shift weight to L and rather leans R with R step phase and L stance phase. L knee tends to flex during L stance time despite tactile and verbal cues to extend.   Stairs  Wheelchair Mobility    Modified Rankin (Stroke Patients Only) Modified Rankin (Stroke Patients Only) Pre-Morbid Rankin Score: Moderate disability Modified Rankin: Moderately  severe disability     Balance Overall balance assessment: Needs assistance Sitting-balance support: Feet supported;Bilateral upper extremity supported Sitting balance-Leahy Scale: Poor Sitting balance - Comments: Min guard for safety. Postural control: Posterior lean;Left lateral lean Standing balance support: During functional activity;Bilateral upper extremity supported Standing balance-Leahy Scale: Poor Standing balance comment: reliant on external support in standing with tendency to lean to L unless trying to take step with R.                            Cognition Arousal/Alertness: Awake/alert Behavior During Therapy: Impulsive Overall Cognitive Status: History of cognitive impairments - at baseline                                 General Comments: Baseline dementia, pt oriented to self. Requires frequent verbal and tactile cues for safety during mobility. Pt impulsive and responds excessively to cues with big steps even onto therapist's foot when standing. Poor carryover with cues to attend to L side and increase L gaze.      Exercises      General Comments General comments (skin integrity, edema, etc.): Tends to gaze to R with min tracking of voice to L despite cues to rotate head and eyes to look to L; utilized glasses with pt able to correctly identify number of fingers held up in midline anterior to him but incorrect when held in L visual field.      Pertinent Vitals/Pain Pain Assessment: No/denies pain    Home Living                      Prior Function            PT Goals (current goals can now be found in the care plan section) Acute Rehab PT Goals Patient Stated Goal: return home PT Goal Formulation: With patient Time For Goal Achievement: 08/09/20 Potential to Achieve Goals: Good Progress towards PT goals: Progressing toward goals    Frequency    Min 3X/week      PT Plan Current plan remains appropriate     Co-evaluation              AM-PAC PT "6 Clicks" Mobility   Outcome Measure  Help needed turning from your back to your side while in a flat bed without using bedrails?: A Little Help needed moving from lying on your back to sitting on the side of a flat bed without using bedrails?: A Little Help needed moving to and from a bed to a chair (including a wheelchair)?: A Lot Help needed standing up from a chair using your arms (e.g., wheelchair or bedside chair)?: A Lot Help needed to walk in hospital room?: A Lot Help needed climbing 3-5 steps with a railing? : A Lot 6 Click Score: 14    End of Session Equipment Utilized During Treatment: Gait belt Activity Tolerance: Patient tolerated treatment well Patient left: in chair;with call bell/phone within reach;with chair alarm set;with nursing/sitter in room Nurse Communication: Mobility status PT Visit Diagnosis: Unsteadiness on feet (R26.81);Other abnormalities of gait and mobility (R26.89);Muscle weakness (generalized) (M62.81);Difficulty in walking, not elsewhere classified (R26.2);Other symptoms and signs involving the nervous system [id number])     Time: [id number] PT  Time Calculation (min) (ACUTE ONLY): 30 min  Charges:  $Gait Training: 8-22 mins $Therapeutic Activity: 8-22 mins                     Raymond Gurney, PT, DPT Acute Rehabilitation Services  Pager: [phone] Office: [phone]    Jewel Baize 07/29/2020, 1:46 PM

## 2020-07-29 NOTE — TOC Progression Note (Signed)
Transition of Care [facility]) - Progression Note    Patient Details  Name: Jeffrey Blair MRN: [id number] Date of Birth: 26-Apr-1928  [facility]) CM/SW Contact  Kermit Balo, RN Phone Number: 07/29/2020, 3:20 PM  Clinical Narrative:    CM spoke to pts son: [name] over the phone. Pt has VA benefits and the family would like to explore what benefits he has through the [place]. CM has called his SW through the [place], [place] and left her voicemail.  CM did update him that his options for SNF rehab through the [place] will be limited. He also wants to see if the patient will get care in the home.  TOC following.   Expected Discharge Plan: Skilled Nursing Facility Barriers to Discharge: Continued Medical Work up  Expected Discharge Plan and Services Expected Discharge Plan: Skilled Nursing Facility In-house Referral: Clinical Social Work   Post Acute Care Choice: Skilled Nursing Facility Living arrangements for the past 2 months: Single Family Home                                       Social Determinants of Health (SDOH) Interventions    Readmission Risk Interventions No flowsheet data found.

## 2020-07-29 NOTE — Plan of Care (Signed)
  Problem: Education: Goal: Knowledge of disease or condition will improve Outcome: Progressing Goal: Knowledge of secondary prevention will improve Outcome: Progressing Goal: Knowledge of patient specific risk factors addressed and post discharge goals established will improve Outcome: Progressing Goal: Individualized Educational Video(s) Outcome: Progressing   Problem: Coping: Goal: Will verbalize positive feelings about self Outcome: Progressing   Problem: Health Behavior/Discharge Planning: Goal: Ability to manage health-related needs will improve Outcome: Progressing   Problem: Self-Care: Goal: Ability to participate in self-care as condition permits will improve Outcome: Progressing Goal: Verbalization of feelings and concerns over difficulty with self-care will improve Outcome: Progressing Goal: Ability to communicate needs accurately will improve Outcome: Progressing   Problem: Nutrition: Goal: Risk of aspiration will decrease Outcome: Progressing Goal: Dietary intake will improve Outcome: Progressing   Problem: Ischemic Stroke/TIA Tissue Perfusion: Goal: Complications of ischemic stroke/TIA will be minimized Outcome: Progressing   Problem: Education: Goal: Knowledge of General Education information will improve Description: Including pain rating scale, medication(s)/side effects and non-pharmacologic comfort measures Outcome: Progressing   Problem: Health Behavior/Discharge Planning: Goal: Ability to manage health-related needs will improve Outcome: Progressing   Problem: Clinical Measurements: Goal: Ability to maintain clinical measurements within normal limits will improve Outcome: Progressing Goal: Will remain free from infection Outcome: Progressing Goal: Diagnostic test results will improve Outcome: Progressing Goal: Respiratory complications will improve Outcome: Progressing Goal: Cardiovascular complication will be avoided Outcome: Progressing    Problem: Activity: Goal: Risk for activity intolerance will decrease Outcome: Progressing   Problem: Nutrition: Goal: Adequate nutrition will be maintained Outcome: Progressing   Problem: Coping: Goal: Level of anxiety will decrease Outcome: Progressing   Problem: Elimination: Goal: Will not experience complications related to bowel motility Outcome: Progressing Goal: Will not experience complications related to urinary retention Outcome: Progressing   Problem: Pain Managment: Goal: General experience of comfort will improve Outcome: Progressing   Problem: Safety: Goal: Ability to remain free from injury will improve Outcome: Progressing   Problem: Skin Integrity: Goal: Risk for impaired skin integrity will decrease Outcome: Progressing   Problem: Education: Goal: Knowledge of disease or condition will improve Outcome: Progressing   Problem: Intracerebral Hemorrhage Tissue Perfusion: Goal: Complications of Intracerebral Hemorrhage will be minimized Outcome: Progressing   Problem: Ischemic Stroke/TIA Tissue Perfusion: Goal: Complications of ischemic stroke/TIA will be minimized Outcome: Progressing   Problem: Spontaneous Subarachnoid Hemorrhage Tissue Perfusion: Goal: Complications of Spontaneous Subarachnoid Hemorrhage will be minimized Outcome: Progressing   

## 2020-07-29 NOTE — Progress Notes (Signed)
SLP Cancellation Note  Patient Details Name: Jeffrey Blair MRN: [id number] DOB: Jun 06, 1928   Cancelled treatment:       Reason Eval/Treat Not Completed: Patient at procedure or test/unavailable; SLP attempted x2 and pt was unavailable.  ST will continue efforts.   Tressie Stalker, M.S., CCC-SLP 07/29/2020, 4:14 PM

## 2020-07-30 ENCOUNTER — Inpatient Hospital Stay (HOSPITAL_COMMUNITY): Payer: PPO

## 2020-07-30 DIAGNOSIS — I639 Cerebral infarction, unspecified: Secondary | ICD-10-CM | POA: Diagnosis not present

## 2020-07-30 DIAGNOSIS — G20 Parkinson's disease: Secondary | ICD-10-CM | POA: Diagnosis not present

## 2020-07-30 LAB — CBC
HCT: 33.8 % — ABNORMAL LOW (ref 39.0–52.0)
Hemoglobin: 11.2 g/dL — ABNORMAL LOW (ref 13.0–17.0)
MCH: 29.2 pg (ref 26.0–34.0)
MCHC: 33.1 g/dL (ref 30.0–36.0)
MCV: 88 fL (ref 80.0–100.0)
Platelets: 213 10*3/uL (ref 150–400)
RBC: 3.84 MIL/uL — ABNORMAL LOW (ref 4.22–5.81)
RDW: 12.8 % (ref 11.5–15.5)
WBC: 5.7 10*3/uL (ref 4.0–10.5)
nRBC: 0 % (ref 0.0–0.2)

## 2020-07-30 LAB — GLUCOSE, CAPILLARY: Glucose-Capillary: 139 mg/dL — ABNORMAL HIGH (ref 70–99)

## 2020-07-30 LAB — BASIC METABOLIC PANEL
Anion gap: 9 (ref 5–15)
BUN: 7 mg/dL — ABNORMAL LOW (ref 8–23)
CO2: 26 mmol/L (ref 22–32)
Calcium: 8.7 mg/dL — ABNORMAL LOW (ref 8.9–10.3)
Chloride: 98 mmol/L (ref 98–111)
Creatinine, Ser: 0.56 mg/dL — ABNORMAL LOW (ref 0.61–1.24)
GFR, Estimated: >60 mL/min (ref >60)
Glucose, Bld: 101 mg/dL — ABNORMAL HIGH (ref 70–99)
Potassium: 3.4 mmol/L — ABNORMAL LOW (ref 3.5–5.1)
Sodium: 133 mmol/L — ABNORMAL LOW (ref 135–145)

## 2020-07-30 MED ORDER — IOHEXOL 350 MG/ML SOLN
75.0000 mL | Freq: Once | INTRAVENOUS | Status: AC | PRN
  Administered 2020-07-30: 75 mL via INTRAVENOUS

## 2020-07-30 MED ORDER — SIMVASTATIN 20 MG PO TABS
40.0000 mg | ORAL_TABLET | Freq: Every evening | ORAL | Status: DC
  Administered 2020-07-30: 20:00:00 40 mg via ORAL
  Filled 2020-07-30: qty 2

## 2020-07-30 NOTE — Care Management Important Message (Signed)
Important Message  Patient Details  Name: Jeffrey Blair MRN: [id number] Date of Birth: 09/02/1927   Medicare Important Message Given:  Yes     Dorena Bodo 07/30/2020, 4:09 PM

## 2020-07-30 NOTE — Progress Notes (Signed)
Physical Therapy Treatment Patient Details Name: Jeffrey Blair MRN: [id number] DOB: 05-Dec-1927 Today's Date: 07/30/2020    History of Present Illness 86 yo male admitted to [facility] from [place] for AMS. CTA demonstrates R proximal MCA posterior division occlusion with distal reconstitution, severe stenosis of proximal basilar and P2 of R PCA. MRI Head 07/27/20 demonstrates acute to subacute infarct in the posterior right MCA territory  infarct, and especially at the right MCA/PCA watershed. Pt and family left AMA, returned. PMH includes parkinson's disease, CVA, prostate cancer, TIA, migraines, cervical disc disease, dementia.    PT Comments    Pt making progress this date as he displayed decreased pushing/leaning in standing and improved motor control/coordination with feet placement taking steps towards recliner. He continues to require cues to sequence weight shifting and stepping along with managing the RW though. He continues to be impulsive and neglect his L side. Pt even did not detect hand of therapist in his L visual field this date. Attempted to facilitate increased L side attention through providing visual feedback via a mirror but pt did not detect the presence of the mirror the majority of the time. Pt required minAx2 for all transfers and ambulating ~3 ft with a RW before fatiguing this date. Will continue to follow acutely. Current recommendations remain appropriate.   Follow Up Recommendations  SNF;Supervision/Assistance - 24 hour;Supervision for mobility/OOB     Equipment Recommendations  None recommended by PT    Recommendations for Other Services       Precautions / Restrictions Precautions Precautions: Fall Restrictions Weight Bearing Restrictions: No    Mobility  Bed Mobility Overal bed mobility: Needs Assistance Bed Mobility: Supine to Sit     Supine to sit: Min assist     General bed mobility comments: MinA to direct legs and to maintain  safety with pt being impulsive and having diffciulty understanding cues to bring legs to the L.  Transfers Overall transfer level: Needs assistance Equipment used: Rolling walker (2 wheeled) Transfers: Sit to/from [employer] Sit to Stand: +2 physical assistance;+2 safety/equipment;Min assist Stand pivot transfers: +2 physical assistance;+2 safety/equipment;Min assist       General transfer comment: MinAx2 to power up to stand, repeatedly cuing and returning hands to bed to push up to stand rather than pull up on RW. Cues provided to sequence steps and management of RW to turn to sit in recliner, with improved motor control this date.  Ambulation/Gait Ambulation/Gait assistance: +2 physical assistance;+2 safety/equipment;Min assist Gait Distance (Feet): 3 Feet Assistive device: Rolling walker (2 wheeled) Gait Pattern/deviations: Step-through pattern;Decreased stride length;Trunk flexed;Narrow base of support;Decreased weight shift to left;Decreased weight shift to right Gait velocity: decr Gait velocity interpretation: <1.31 ft/sec, indicative of household ambulator General Gait Details: MinAx2 and tactile and verbal cues to shift weight and advance each leg to sequenc lateral steps and turn to sit in recliner. Plcaed mirror anterior to pt to attempt to inc L side attention, with min success as pt avoided mirror or was unabel to follow cues to detect mirror often.   Stairs             Wheelchair Mobility    Modified Rankin (Stroke Patients Only) Modified Rankin (Stroke Patients Only) Pre-Morbid Rankin Score: Moderate disability Modified Rankin: Moderately severe disability     Balance Overall balance assessment: Needs assistance Sitting-balance support: Feet supported;Bilateral upper extremity supported Sitting balance-Leahy Scale: Poor Sitting balance - Comments: Min guard for safety.   Standing balance support: During  functional activity;Bilateral upper  extremity supported Standing balance-Leahy Scale: Poor Standing balance comment: reliant on external support in standing with min lean noted this date. Provided visual feedback via mirror with min success.                            Cognition Arousal/Alertness: Awake/alert Behavior During Therapy: Impulsive Overall Cognitive Status: History of cognitive impairments - at baseline                                 General Comments: Baseline dementia, pt oriented to self. Requires frequent verbal and tactile cues for safety during mobility. Pt impulsive and responds excessively to cues. Poor carryover with cues to attend to L side and increase L gaze.      Exercises      General Comments General comments (skin integrity, edema, etc.): Attempted to inc pursuit of eyes across midline to L with min success when cued to follow hand or voice. Attempted utilizing mirror but pt unable to detect it often. Continues to display L visual neglect as pt did not see therapist's hand in L visual field.      Pertinent Vitals/Pain Pain Assessment: Faces Faces Pain Scale: No hurt Pain Intervention(s): Limited activity within patient's tolerance;Monitored during session;Repositioned    Home Living                      Prior Function            PT Goals (current goals can now be found in the care plan section) Acute Rehab PT Goals Patient Stated Goal: to improve PT Goal Formulation: With patient Time For Goal Achievement: 08/09/20 Potential to Achieve Goals: Good Progress towards PT goals: Progressing toward goals    Frequency    Min 3X/week      PT Plan Current plan remains appropriate    Co-evaluation              AM-PAC PT "6 Clicks" Mobility   Outcome Measure  Help needed turning from your back to your side while in a flat bed without using bedrails?: A Little Help needed moving from lying on your back to sitting on the side of a flat bed  without using bedrails?: A Little Help needed moving to and from a bed to a chair (including a wheelchair)?: A Little Help needed standing up from a chair using your arms (e.g., wheelchair or bedside chair)?: A Little Help needed to walk in hospital room?: A Lot Help needed climbing 3-5 steps with a railing? : A Lot 6 Click Score: 16    End of Session Equipment Utilized During Treatment: Gait belt Activity Tolerance: Patient tolerated treatment well Patient left: in chair;with call bell/phone within reach;with chair alarm set;with nursing/sitter in room Nurse Communication: Mobility status PT Visit Diagnosis: Unsteadiness on feet (R26.81);Other abnormalities of gait and mobility (R26.89);Muscle weakness (generalized) (M62.81);Difficulty in walking, not elsewhere classified (R26.2);Other symptoms and signs involving the nervous system (R29.898)     Time: [id number] PT Time Calculation (min) (ACUTE ONLY): 25 min  Charges:  $Therapeutic Activity: 8-22 mins $Neuromuscular Re-education: 8-22 mins                     Moishe Spice, PT, DPT Acute Rehabilitation Services  Pager: [phone] Office: [phone]    Orvan Falconer 07/30/2020, 12:37 PM

## 2020-07-30 NOTE — TOC Progression Note (Signed)
Transition of Care [facility]) - Progression Note    Patient Details  Name: Jeffrey Blair MRN: [id number] Date of Birth: 1927-09-17  [facility]) CM/SW Contact  Kermit Balo, RN Phone Number: 07/30/2020, 1:12 PM  Clinical Narrative:    CM spoke to [place] over the phone about SNF vs home with family. CM provided him the information from [facility] [place] SW through [place]. Pt is not services connected so he will not qualify for any SNF rehab benefits. He could receive HH services, aide services, DME through the [place]. [name] to speak with his family and update CM with decision. TOC following.   Expected Discharge Plan: Skilled Nursing Facility Barriers to Discharge: Continued Medical Work up  Expected Discharge Plan and Services Expected Discharge Plan: Skilled Nursing Facility In-house Referral: Clinical Social Work   Post Acute Care Choice: Skilled Nursing Facility Living arrangements for the past 2 months: Single Family Home                                       Social Determinants of Health (SDOH) Interventions    Readmission Risk Interventions No flowsheet data found.

## 2020-07-30 NOTE — Consult Note (Signed)
Referring Physician: Dr. Jomarie Longs     Chief Complaint: CVA   HPI: Jeffrey Blair is a 86 y.o. male with past medical history of Parkinson's disease, dementia, Stroke, Migraine and prostate cancer who was brought to the hospital by family for worsening balance and gait instability, confusion and dysarthria. MRI brain revealed acute to subacute infarcts in the posterior right MCA territory and right MCA/PCA watershed territory with cytotoxic edema with possible petechial hemorrhage.   Patient is sitting up in bed, in no acute distress, awake and alert.  He is mildly confused, not able to tell me why he is in the hospital or the current month. He does not voice any complaints or concerns.  No family at bedside. Safety sitter is present with patient. Neurology consulted for acute/subacute stroke found on MRI brain.   Past Medical History:  Diagnosis Date  . Cancer (HCC)    prostate-in remission  . Cerebrovascular disease 01/25/2016  . Cervical disc disease 01/25/2016  . Dementia (HCC)   . Migraine   . Parkinson disease (HCC)   . Stroke (HCC)   . TIA (transient ischemic attack) 01/24/2016    History reviewed. No pertinent surgical history.  Family History  Problem Relation Age of Onset  . Allergies Child   . Cancer Father        lung cancer  . Cancer Brother        colon  . Cancer Sister    Social History:  reports that he quit smoking about 35 years ago. His smoking use included cigarettes. He has a 10.00 pack-year smoking history. He has never used smokeless tobacco. He reports that he does not drink alcohol and does not use drugs.  Allergies:  Allergies  Allergen Reactions  . Morphine Anaphylaxis, Shortness Of Breath and Other (See Comments)    Altered mental status; takes tramadol at home  . Penicillins Other (See Comments)    Has patient had a PCN reaction causing immediate rash, facial/tongue/throat swelling, SOB or lightheadedness with hypotension: unknown Has patient had a  PCN reaction causing severe rash involving mucus membranes or skin necrosis: unknown Has patient had a PCN reaction that required hospitalization; unknown Has patient had a PCN reaction occurring within the last 10 years:unknown If all of the above answers are "NO", then may proceed with Cephalosporin use.  Unknown reaction     ROS: No voiced complaints   Physical Examination: Blood pressure 140/80, pulse 90, temperature 98.3 F (36.8 C), temperature source Oral, resp. rate 17, height 6\' 1"  (1.854 m), weight 72.6 kg, SpO2 100 %.  Neurologic Examination:  Physical Exam  Constitutional: Appears well-developed and well-nourished. Not in distress, resting comfortably Psych: Affect appropriate to situation Eyes: No scleral injection HENT: nontraumatic, edentulous  MSK: no joint deformities.  Cardiovascular: Normal rate and regular rhythm.  Respiratory: Effort normal, non-labored breathing GI: Soft.  No distension. There is no tenderness.  Skin: WDI, discolored skin patches over chest  Neuro: Mental Status:  Patient is awake, alert, oriented to person and year.  Cranial Nerves: II: Visual Fields with left hemianopsia. Pupils are equal, round, and reactive to light.  III,IV, VI: EOMI right gaze preference, can cross midline. V: Facial sensation is symmetric to temperature VII: left facial droop VIII: hearing is intact to voice X: Palate elevates symmetrically, gag/cough not tested in this awake patient XI: Shoulder shrug is symmetric. XII: tongue is midline without atrophy or fasciculations.  Motor: Tone is normal. Bulk is normal. 5/5 strength was present  on right upper and lower. Left side is 4/5 upper and lower Sensory: Sensation is symmetric to light touch and temperature in the arms and legs. Cerebellar: FNF ataxia on left side    1a Level of Conscious.: alert 0 1b LOC Questions: one correct 1 1c LOC Commands: both correct 0 2 Best Gaze: partial deviation 1 3  Visual: complete 2 4 Facial Palsy: partial 2 5a Motor Arm - left: drift 1 5b Motor Arm - Right: none 0 6a Motor Leg - Left: drift 1 6b Motor Leg - Right: none 0 7 Limb Ataxia: one limb 1 8 Sensory: normal 0 9 Best Language: no aphasia 0 10 Dysarthria: mild 1 11 Extinct. and Inatten.: normal 0 TOTAL: 10  Results for orders placed or performed during the hospital encounter of 07/25/20 (from the past 48 hour(s))  CBC     Status: Abnormal   Collection Time: 07/30/20  3:19 AM  Result Value Ref Range   WBC 5.7 4.0 - 10.5 K/uL   RBC 3.84 (L) 4.22 - 5.81 MIL/uL   Hemoglobin 11.2 (L) 13.0 - 17.0 g/dL   HCT 33.8 (L) 39.0 - 52.0 %   MCV 88.0 80.0 - 100.0 fL   MCH 29.2 26.0 - 34.0 pg   MCHC 33.1 30.0 - 36.0 g/dL   RDW 12.8 11.5 - 15.5 %   Platelets 213 150 - 400 K/uL   nRBC 0.0 0.0 - 0.2 %    Comment: Performed at [facility], [place] [address] [place], [place] [id number]  Basic metabolic panel     Status: Abnormal   Collection Time: 07/30/20  3:19 AM  Result Value Ref Range   Sodium 133 (L) 135 - 145 mmol/L   Potassium 3.4 (L) 3.5 - 5.1 mmol/L   Chloride 98 98 - 111 mmol/L   CO2 26 22 - 32 mmol/L   Glucose, Bld 101 (H) 70 - 99 mg/dL    Comment: Glucose reference range applies only to samples taken after fasting for at least 8 hours.   BUN 7 (L) 8 - 23 mg/dL   Creatinine, Ser 0.56 (L) 0.61 - 1.24 mg/dL   Calcium 8.7 (L) 8.9 - 10.3 mg/dL   GFR, Estimated >60 >60 mL/min    Comment: (NOTE) Calculated using the CKD-EPI Creatinine Equation (2021)    Anion gap 9 5 - 15    Comment: Performed at [facility] [address], [place], [place] [address]  Glucose, capillary     Status: Abnormal   Collection Time: 07/30/20 11:37 AM  Result Value Ref Range   Glucose-Capillary 139 (H) 70 - 99 mg/dL    Comment: Glucose reference range applies only to samples taken after fasting for at least 8 hours.   MRI brain: - Acute to subacute infarct in the posterior right MCA  territory infarct, and especially at the right MCA/PCA watershed (fetal type PCA origins, see #2). Cytotoxic edema with possible petechial hemorrhage but no malignant hemorrhagic transformation or mass effect. - Chronically poor distal vertebral artery flow with evidence of reconstituted basilar, grossly stable since 2017. - Mild progression of underlying chronic small vessel disease since 2017.  Assessment: 86 y.o. male with subacute posterior right MCA territory ischemic infarction 1. Exam reveals patient to be alert, awake and mildly confused. There is left facial droop, left arm and leg weakness, left hemianopsia with right gaze preference 2. MRI brain: Acute to subacute infarct in the posterior right MCA territory infarct, and especially at  the right MCA/PCA watershed (fetal type PCA origins). Cytotoxic edema with possible petechial hemorrhage but no malignant hemorrhagic transformation or mass effect.Chronically poor distal vertebral artery flow with evidence of reconstituted basilar, grossly stable since 2017. Mild progression of underlying chronic small vessel disease since 2017. 3. TTE: No intracardiac source of embolism detected.  4. No atrial fibrillation on EKG 5. Stroke Risk Factors - History of stroke and TIA 6. HgbA1c normal. Total cholesterol and LDL levels are abnormally elevated 7. Classifiable as having failed ASA monotherapy 8. Carotid ultrasound: Mild atherosclerotic disease in the bilateral carotid arteries. Estimated degree of stenosis in the internal carotid arteries is less than 50% bilaterally. Bilateral vertebral arteries are not visualized. Cannot exclude occlusive disease 9. Other neurological conditions: Parkinson's disease, dementia and migraines  Recommendations: 1. CTA of head and neck 2. Continue cardiac telemetry to assess for possible paroxysmal a-fib 3. PT consult, OT consult, Speech consult 4. Continue simvastatin 5. Plavix has been added to ASA 6.  BP management 7. Continuing Sinemet 8. Risk factor modification 9. Frequent neuro checks  @Electronically  signed: Dr. Kerney Elbe  07/30/2020, 12:11 PM

## 2020-07-30 NOTE — Progress Notes (Addendum)
PROGRESS NOTE    Jeffrey Blair   [id number]  DOB: [birth year]  DOA: 07/25/2020     3  PCP: [facility]  CC: Change in behavior, balance, speech  Hospital Course: Jeffrey Blair is a 86 yo male with PMH Parkinson's disease, dementia, debility, hx cerebellar CVA (seen on imaging but u/k to family until now), cervical DDD who was brought to the hospital for worsening balance/mobility, worsening confusion, and dysarthria.   Was taken to  [facility] ER, patient got agitated had a very long wait time,  signed out AMA by family then brought to [facility] the following day and ultimately now at [facility].   -MRI brain performed on admission showed chronic cerebellar infarcts.  There was also chronic bilateral white matter changes that had progressed since 2017.  MRI also showed an acute widespread infarct involving approximately a 7 cm area of the right MCA/PCA territory.  He also failed a swallow eval performed on 07/27/2020 with speech therapy.  -on 12/28: discussion between [place] with family, decision made for DNR  Interval History:  -Tired, sleeping most of the day, able to eat a few bites of breakfast only  Assessment & Plan:  Acute CVA (cerebrovascular accident) ([place]) - per MRI brain: "Widespread patchy and confluent restricted diffusion in the posterior right MCA territory. The infarct is most confluent in a 7 cm area of the the right MCA/PCA watershed territory " - given age and underlying dementia/comorbidites, dysphagia patient prognosis was felt to be poor, Dr.Girgaus discussed this with [name] on 12/27 and 12/28 and made DNR - Tele neurology at [facility] recommended -asa/plavix, will review with Neuro team here given size of CVA -LDL is 165, continue statin-dose increased, hemoglobin A1c is 5.6 -2D echo at any plan noted preserved EF, no cardiac source of embolus -Carotid duplex with <50% bilateral stenosis of internal carotid arteries - mild  improvement in terms of swallow function (passed and being started on dysphagia 1 diet on 12/28)  Parkinson disease ([place]) Dementia -dys 1 diet started on 12/28; -Continue to monitor for tolerance -Continue Sinemet and primidone   Normocytic anemia -Stable  Hypertension -Labetalol as needed  Hyperlipidemia -Continue simvastatin  COPD (chronic obstructive pulmonary disease) ([place]) -Stable -Continue Incruse Ellipta   DVT prophylaxis: Add Lovenox Code Status: DNR Family Communication: No family at bedside, will update son Disposition Plan: Status is: Inpatient  Remains inpatient appropriate because:Altered mental status, Unsafe d/c plan and Inpatient level of care appropriate due to severity of illness   Dispo: The patient is from: Home              Anticipated d/c is to: SNF              Anticipated d/c date is: > Possibly 48 hours if remains stable              Patient currently is not medically stable to d/c.  Objective: Blood pressure 140/80, pulse 90, temperature 98.3 F (36.8 C), temperature source Oral, resp. rate 17, height 6\' 1"  (1.854 m), weight 72.6 kg, SpO2 100 %.  Examination:  Gen: Awake alert, impulsive, oriented to self only, dysarthria noted, intermittently follows commands hate awake, alert, impulsive, oriented to self only, intermittently follows commands, confused HEENT:no JVD, facial droop Lungs: Diminished at the bases otherwise clear bilaterally CVS: S1-S2, regular rate rhythm Abdomen: Soft, nontender, bowel sounds present Extremities: Left lower leg with soft tissue swelling, deformity likely from prior injury skin: No rashes  on exposed skin Neuro: Awake alert, oriented to self only, dysarthria and facial droop noted intermittently follows commands  Consultants:   Neuro  Procedures:     Data Reviewed: I have personally reviewed following labs and imaging studies Results for orders placed or performed during the hospital encounter of  07/25/20 (from the past 24 hour(s))  CBC     Status: Abnormal   Collection Time: 07/30/20  3:19 AM  Result Value Ref Range   WBC 5.7 4.0 - 10.5 K/uL   RBC 3.84 (L) 4.22 - 5.81 MIL/uL   Hemoglobin 11.2 (L) 13.0 - 17.0 g/dL   HCT 27.0 (L) 35.0 - 09.3 %   MCV 88.0 80.0 - 100.0 fL   MCH 29.2 26.0 - 34.0 pg   MCHC 33.1 30.0 - 36.0 g/dL   RDW 81.8 29.9 - 37.1 %   Platelets 213 150 - 400 K/uL   nRBC 0.0 0.0 - 0.2 %  Basic metabolic panel     Status: Abnormal   Collection Time: 07/30/20  3:19 AM  Result Value Ref Range   Sodium 133 (L) 135 - 145 mmol/L   Potassium 3.4 (L) 3.5 - 5.1 mmol/L   Chloride 98 98 - 111 mmol/L   CO2 26 22 - 32 mmol/L   Glucose, Bld 101 (H) 70 - 99 mg/dL   BUN 7 (L) 8 - 23 mg/dL   Creatinine, Ser 6.96 (L) 0.61 - 1.24 mg/dL   Calcium 8.7 (L) 8.9 - 10.3 mg/dL   GFR, Estimated >78 >93 mL/min   Anion gap 9 5 - 15  Glucose, capillary     Status: Abnormal   Collection Time: 07/30/20 11:37 AM  Result Value Ref Range   Glucose-Capillary 139 (H) 70 - 99 mg/dL    Recent Results (from the past 240 hour(s))  Resp Panel by RT-PCR (Flu A&B, Covid) Nasopharyngeal Swab     Status: None   Collection Time: 07/25/20 11:10 PM   Specimen: Nasopharyngeal Swab; Nasopharyngeal(NP) swabs in vial transport medium  Result Value Ref Range Status   SARS Coronavirus 2 by RT PCR NEGATIVE NEGATIVE Final    Comment: (NOTE) SARS-CoV-2 target nucleic acids are NOT DETECTED.  The SARS-CoV-2 RNA is generally detectable in upper respiratory specimens during the acute phase of infection. The lowest concentration of SARS-CoV-2 viral copies this assay can detect is 138 copies/mL. A negative result does not preclude SARS-Cov-2 infection and should not be used as the sole basis for treatment or other patient management decisions. A negative result may occur with  improper specimen collection/handling, submission of specimen other than nasopharyngeal swab, presence of viral mutation(s) within  the areas targeted by this assay, and inadequate number of viral copies(<138 copies/mL). A negative result must be combined with clinical observations, patient history, and epidemiological information. The expected result is Negative.  Fact Sheet for Patients:  BloggerCourse.com  Fact Sheet for Healthcare Providers:  SeriousBroker.it  This test is no t yet approved or cleared by the [place] FDA and  has been authorized for detection and/or diagnosis of SARS-CoV-2 by FDA under an Emergency Use Authorization (EUA). This EUA will remain  in effect (meaning this test can be used) for the duration of the COVID-19 declaration under Section 564(b)(1) of the Act, 21 U.S.C.section 360bbb-3(b)(1), unless the authorization is terminated  or revoked sooner.       Influenza A by PCR NEGATIVE NEGATIVE Final   Influenza B by PCR NEGATIVE NEGATIVE Final    Comment: (NOTE) The  Xpert Xpress SARS-CoV-2/FLU/RSV plus assay is intended as an aid in the diagnosis of influenza from Nasopharyngeal swab specimens and should not be used as a sole basis for treatment. Nasal washings and aspirates are unacceptable for Xpert Xpress SARS-CoV-2/FLU/RSV testing.  Fact Sheet for Patients: BloggerCourse.com  Fact Sheet for Healthcare Providers: SeriousBroker.it  This test is not yet approved or cleared by the [place] FDA and has been authorized for detection and/or diagnosis of SARS-CoV-2 by FDA under an Emergency Use Authorization (EUA). This EUA will remain in effect (meaning this test can be used) for the duration of the COVID-19 declaration under Section 564(b)(1) of the Act, 21 U.S.C. section 360bbb-3(b)(1), unless the authorization is terminated or revoked.  Performed at [facility], [address], [place], [place] [address]      Radiology Studies: No results found. MR BRAIN WO  CONTRAST  Final Result    US Carotid Bilateral (at [facility] and [facility] only)  Final Result      Scheduled Meds: . aspirin EC  81 mg Oral Daily  . carbidopa-levodopa  3 tablet Oral BID  . clopidogrel  75 mg Oral Daily  . pantoprazole  40 mg Oral Daily  . primidone  150 mg Oral BID  . simvastatin  20 mg Oral QPM  . umeclidinium bromide  1 puff Inhalation Daily  . venlafaxine  37.5 mg Oral BID  . vitamin B-12  1,000 mcg Oral Daily   PRN Meds: acetaminophen **OR** acetaminophen, albuterol, haloperidol lactate, labetalol, loratadine, LORazepam, meclizine, ondansetron **OR** ondansetron (ZOFRAN) IV, polyvinyl alcohol Continuous Infusions:    LOS: 3 days  Time spent:  Zannie Cove, MD [facility] 07/30/2020, 12:05 PM

## 2020-07-30 NOTE — Progress Notes (Signed)
  Speech Language Pathology Treatment: Dysphagia  Patient Details Name: Jeffrey Blair MRN: [id number] DOB: Feb 04, 1928 Today's Date: 07/30/2020 Time: 1010-1025 SLP Time Calculation (min) (ACUTE ONLY): 15 min  Assessment / Plan / Recommendation Clinical Impression  Pt seen for skilled observation with intake of D1/thin via straw with mod encouragement to consume POs; pt exhibited oral holding and delayed swallow initiation throughout trial, but no s/s of aspiration noted.  Pt grimaced with intake of thin via straw, but was able to consume safely.  Odynophagia may indicate esophageal component.  Jeffrey Blair speech was noted to be 50% intelligible at phrase-sentence level this session due to decreased intensity and imprecise articulation. ST will continue to f/u while in acute setting for potential diet progression/education and speech tx for dysarthric speech.    HPI HPI: Jeffrey Blair is a 86 y.o. male with medical history significant of prostate cancer in remission, history of other nonhemorrhagic CVA, TIA, Parkinson's disease, migraine headaches, cervical disc disease, dementia who is brought to the emergency department by his son from [place] after he was taken due to AMS secondary to acute other nonhemorrhagic CVA. MRI Head 07/27/20: Acute to subacute infarct in the posterior right MCA territory  infarct, and especially at the right MCA/PCA watershed (fetal type PCA origins)      SLP Plan  Continue with current plan of care       Recommendations  Diet recommendations: Dysphagia 1 (puree);Thin liquid Liquids provided via: Cup;Straw Medication Administration: Whole meds with puree Supervision: Staff to assist with self feeding;Full supervision/cueing for compensatory strategies Compensations: Slow rate;Small sips/bites                Oral Care Recommendations: Oral care BID Follow up Recommendations: Skilled Nursing facility SLP Visit Diagnosis: Dysphagia, oropharyngeal  phase (R13.12) Plan: Continue with current plan of care                      Tressie Stalker, M.S., CCC-SLP 07/30/2020, 12:25 PM

## 2020-07-31 ENCOUNTER — Inpatient Hospital Stay (HOSPITAL_COMMUNITY): Payer: PPO

## 2020-07-31 DIAGNOSIS — I679 Cerebrovascular disease, unspecified: Secondary | ICD-10-CM | POA: Diagnosis not present

## 2020-07-31 DIAGNOSIS — F0281 Dementia in other diseases classified elsewhere with behavioral disturbance: Secondary | ICD-10-CM

## 2020-07-31 DIAGNOSIS — I639 Cerebral infarction, unspecified: Secondary | ICD-10-CM | POA: Diagnosis not present

## 2020-07-31 DIAGNOSIS — G20 Parkinson's disease: Secondary | ICD-10-CM | POA: Diagnosis not present

## 2020-07-31 LAB — BASIC METABOLIC PANEL
Anion gap: 12 (ref 5–15)
BUN: 7 mg/dL — ABNORMAL LOW (ref 8–23)
CO2: 27 mmol/L (ref 22–32)
Calcium: 9.1 mg/dL (ref 8.9–10.3)
Chloride: 96 mmol/L — ABNORMAL LOW (ref 98–111)
Creatinine, Ser: 0.62 mg/dL (ref 0.61–1.24)
GFR, Estimated: >60 mL/min (ref >60)
Glucose, Bld: 98 mg/dL (ref 70–99)
Potassium: 3.5 mmol/L (ref 3.5–5.1)
Sodium: 135 mmol/L (ref 135–145)

## 2020-07-31 LAB — CBC
HCT: 38.1 % — ABNORMAL LOW (ref 39.0–52.0)
Hemoglobin: 12.7 g/dL — ABNORMAL LOW (ref 13.0–17.0)
MCH: 29.3 pg (ref 26.0–34.0)
MCHC: 33.3 g/dL (ref 30.0–36.0)
MCV: 87.8 fL (ref 80.0–100.0)
Platelets: 236 10*3/uL (ref 150–400)
RBC: 4.34 MIL/uL (ref 4.22–5.81)
RDW: 12.7 % (ref 11.5–15.5)
WBC: 7.4 10*3/uL (ref 4.0–10.5)
nRBC: 0 % (ref 0.0–0.2)

## 2020-07-31 MED ORDER — ASPIRIN EC 325 MG PO TBEC
325.0000 mg | DELAYED_RELEASE_TABLET | Freq: Every day | ORAL | Status: DC
  Administered 2020-08-01 – 2020-08-07 (×7): 325 mg via ORAL
  Filled 2020-07-31 (×8): qty 1

## 2020-07-31 MED ORDER — IBUPROFEN 200 MG PO TABS
400.0000 mg | ORAL_TABLET | Freq: Once | ORAL | Status: AC
  Administered 2020-07-31: 400 mg via ORAL
  Filled 2020-07-31: qty 2

## 2020-07-31 MED ORDER — ATORVASTATIN CALCIUM 40 MG PO TABS
40.0000 mg | ORAL_TABLET | Freq: Every day | ORAL | Status: DC
  Administered 2020-07-31 – 2020-08-09 (×9): 40 mg via ORAL
  Filled 2020-07-31 (×10): qty 1

## 2020-07-31 MED ORDER — IOHEXOL 350 MG/ML SOLN
60.0000 mL | Freq: Once | INTRAVENOUS | Status: AC | PRN
  Administered 2020-07-31: 60 mL via INTRAVENOUS

## 2020-07-31 MED ORDER — ENOXAPARIN SODIUM 40 MG/0.4ML ~~LOC~~ SOLN
40.0000 mg | SUBCUTANEOUS | Status: DC
  Administered 2020-07-31 – 2020-08-05 (×5): 40 mg via SUBCUTANEOUS
  Filled 2020-07-31 (×5): qty 0.4

## 2020-07-31 NOTE — Plan of Care (Signed)
  Problem: Education: Goal: Knowledge of disease or condition will improve Outcome: Progressing Goal: Knowledge of secondary prevention will improve Outcome: Progressing Goal: Knowledge of patient specific risk factors addressed and post discharge goals established will improve Outcome: Progressing Goal: Individualized Educational Video(s) Outcome: Progressing   Problem: Ischemic Stroke/TIA Tissue Perfusion: Goal: Complications of ischemic stroke/TIA will be minimized Outcome: Progressing   Problem: Education: Goal: Knowledge of disease or condition will improve Outcome: Progressing

## 2020-07-31 NOTE — TOC Progression Note (Signed)
Transition of Care [facility]) - Progression Note    Patient Details  Name: Jeffrey Blair MRN: [id number] Date of Birth: 1928/01/25  Transition of Care [facility]) CM/SW Contact  Kermit Balo, RN Phone Number: 07/31/2020, 12:14 PM  Clinical Narrative:    CM spoke to [name] (pt son) and he is in agreement with having pt faxed out for SNF rehab in the [facility] area. CM has also sent information needed to [facility].  TOC following.   Expected Discharge Plan: Skilled Nursing Facility Barriers to Discharge: Continued Medical Work up  Expected Discharge Plan and Services Expected Discharge Plan: Skilled Nursing Facility In-house Referral: Clinical Social Work   Post Acute Care Choice: Skilled Nursing Facility Living arrangements for the past 2 months: Single Family Home                                       Social Determinants of Health (SDOH) Interventions    Readmission Risk Interventions No flowsheet data found.

## 2020-07-31 NOTE — Progress Notes (Addendum)
STROKE TEAM PROGRESS NOTE   INTERVAL HISTORY No family at the bedside.  Patient lying in bed, intermittent agitation, trying to get out of the bed.  Disorientated, thinking he is at home.  Incorrect on his age, however able to name and repeat with dysarthric voice.  Right facial droop, right upper and lower extremity mild hemiparesis.  MRI showed sizable right MCA infarct.  CTA head and neck right posterior M2 occlusion, and severe multifocal intracranial stenosis.  Vitals:   07/31/20 0024 07/31/20 0413 07/31/20 0809 07/31/20 1058  BP:  (!) 151/73 (!) 145/73 (!) 146/81  Pulse:  73 77 67  Resp:  18 17 16   Temp: 98.3 F (36.8 C) 97.8 F (36.6 C) 98 F (36.7 C) 97.8 F (36.6 C)  TempSrc: Oral Oral Oral Oral  SpO2:  99% 100% 99%  Weight:      Height:       CBC:  Recent Labs  Lab 07/26/20 0021 07/30/20 0319 07/31/20 0344  WBC 7.8 5.7 7.4  NEUTROABS 5.5  --   --   HGB 12.2* 11.2* 12.7*  HCT 38.4* 33.8* 38.1*  MCV 93.4 88.0 87.8  PLT 231 213 [id number]   Basic Metabolic Panel:  Recent Labs  Lab 07/30/20 0319 07/31/20 0344  NA 133* 135  K 3.4* 3.5  CL 98 96*  CO2 26 27  GLUCOSE 101* 98  BUN 7* 7*  CREATININE 0.56* 0.62  CALCIUM 8.7* 9.1   Lipid Panel:  Recent Labs  Lab 07/27/20 1013  CHOL 241*  TRIG 82  HDL 60  CHOLHDL 4.0  VLDL 16  LDLCALC 165*   HgbA1c:  Recent Labs  Lab 07/27/20 1013  HGBA1C 5.6   Urine Drug Screen:  Recent Labs  Lab 07/25/20 2245  LABOPIA NONE DETECTED  COCAINSCRNUR NONE DETECTED  LABBENZ NONE DETECTED  AMPHETMU NONE DETECTED  THCU NONE DETECTED  LABBARB NONE DETECTED    Alcohol Level  Recent Labs  Lab 07/26/20 0021  ETH <10    IMAGING past 24 hours CT ANGIO HEAD W OR WO CONTRAST  Addendum Date: 07/31/2020   ADDENDUM REPORT: 07/31/2020 11:23 ADDENDUM: Study discussed by telephone with Dr. Rosalin Hawking on 07/31/2020 at 1112 hours. Electronically Signed   By: Genevie Ann M.D.   On: 07/31/2020 11:23   Result Date:  07/31/2020 CLINICAL DATA:  86 year old male with recent posterior right MCA and right MCA/PCA watershed infarct on brain MRI 07/27/2020. CTA neck yesterday with bilateral vertebral artery severe stenoses, occlusion. Negative cervical carotid arteries. EXAM: CT ANGIOGRAPHY HEAD TECHNIQUE: Multidetector CT imaging of the head was performed using the standard protocol during bolus administration of intravenous contrast. Multiplanar CT image reconstructions and MIPs were obtained to evaluate the vascular anatomy. CONTRAST:  37mL OMNIPAQUE IOHEXOL 350 MG/ML SOLN COMPARISON:  Brain MRI 07/27/2020. CTA neck yesterday. Plain head CT 01/24/2016. FINDINGS: CT HEAD Brain: Confluent cytotoxic edema corresponding to the abnormal DWI in the posterior right MCA and right MCA/PCA watershed area (series 5, image 16). No significant mass effect. No definite associated acute hemorrhage. Background cerebral volume not significantly changed since 2017. No ventriculomegaly. No midline shift. Patchy bilateral cerebral white matter hypodensity has progressed since 2017. Gray-white matter differentiation in the deep gray nuclei, brainstem and cerebellum appear stable. Calvarium and skull base: No acute osseous abnormality identified. Paranasal sinuses: Visualized paranasal sinuses and mastoids are stable and well pneumatized. Orbits: No acute orbit or scalp soft tissue finding. CTA HEAD Posterior circulation: Poor enhancement of the distal vertebral  arteries, which appear functionally occluded beyond the PICA origins. Poor enhancement of the proximal basilar artery, which appears reconstituted from posterior communicating arteries at the basilar tip. Most of the basilar artery does remain patent (see sagittal series 14, image 21). Patent SCA origins. Moderate left PCA P1 and P2 stenoses. Severe stenosis right PCA P1 origin although better contribution from the right posterior communicating artery (series 12, image 24). Severe additional  right P2 and PCA bifurcation stenoses (series 14, image 19). Bilateral distal PCA branches are patent but attenuated. Anterior circulation: Both ICA siphons are patent and somewhat dolichoectatic. Minimal siphon calcified plaque. No siphon stenosis. Normal ophthalmic and posterior communicating artery origins. Patent carotid termini. Patent MCA and ACA origins. Mild bilateral A1 irregularity without stenosis. Partially fenestrated appearing anterior communicating artery (normal variant). Left ACA branches are within normal limits. There is mild to moderate stenosis of the distal right A2 (series 14, image 21). Left MCA M1 segment and bifurcation are patent without stenosis. No left MCA branch occlusion is identified. There is generalized mild branch irregularity. There is a left posterior M3 branch severe stenosis at its origin on series 14, image 30. Right MCA origin is mildly irregular and stenotic. Right MCA M1 remains patent. But at the right MCA bifurcation there is an M2 branch occlusion (series 13, images 71 and 70). Remaining anterior division MCA branches appear mildly irregular. Venous sinuses: Early contrast timing, not well evaluated. Anatomic variants: None significant. Review of the MIP images confirms the above findings IMPRESSION: 1. Positive for recent Large Vessel Occlusion: Right MCA posterior M2 branch. 2. Superimposed severe widespread posterior circulation stenosis, including severe tandem stenoses of the Right PCA which in conjunction with #1 likely explain the vascular territory involvement of the recent acute infarct (right MCA/PCA). 3. Positive also for Occluded Distal Vertebral Arteries as demonstrated on the neck yesterday. There is suboptimal reconstitution of the Basilar Artery from bilateral posterior communicating arteries. 4. Other intracranial atherosclerosis: Severe stenosis Left MCA posterior M3 branch. Moderate stenosis Right ACA A2. Mild stenosis Right MCA origin. 5. Expected CT  appearance of recent infarcts from #1 and #2. No malignant hemorrhagic transformation or mass effect. Electronically Signed: By: Odessa Fleming M.D. On: 07/31/2020 10:59   CT ANGIO NECK W OR WO CONTRAST  Result Date: 07/30/2020 CLINICAL DATA:  Acute neurologic deficit. EXAM: CT ANGIOGRAPHY NECK TECHNIQUE: Multidetector CT imaging of the neck was performed using the standard protocol during bolus administration of intravenous contrast. Multiplanar CT image reconstructions and MIPs were obtained to evaluate the vascular anatomy. Carotid stenosis measurements (when applicable) are obtained utilizing NASCET criteria, using the distal internal carotid diameter as the denominator. CONTRAST:  24mL OMNIPAQUE IOHEXOL 350 MG/ML SOLN COMPARISON:  None. FINDINGS: CTA NECK FINDINGS SKELETON: There is no bony spinal canal stenosis. No lytic or blastic lesion. OTHER NECK: Normal pharynx, larynx and major salivary glands. No cervical lymphadenopathy. Unremarkable thyroid gland. UPPER CHEST: No pneumothorax or pleural effusion. No nodules or masses. AORTIC ARCH: There is no calcific atherosclerosis of the aortic arch. There is no aneurysm, dissection or hemodynamically significant stenosis of the visualized portion of the aorta. Conventional 3 vessel aortic branching pattern. The visualized proximal subclavian arteries are widely patent. RIGHT CAROTID SYSTEM: Normal without aneurysm, dissection or stenosis. LEFT CAROTID SYSTEM: Normal without aneurysm, dissection or stenosis. VERTEBRAL ARTERIES: There is severe multifocal stenosis with multiple areas of occlusion both vertebral arteries involving all segments. IMPRESSION: 1. Severe multifocal stenosis of both vertebral arteries with multiple areas of  occlusion, likely due to atherosclerotic disease. 2. No carotid stenosis. Electronically Signed   By: Ulyses Jarred M.D.   On: 07/30/2020 23:10    PHYSICAL EXAM  Temp:  [97.8 F (36.6 C)-98.7 F (37.1 C)] 97.8 F (36.6 C) (12/31  1058) Pulse Rate:  [67-88] 80 (12/31 1455) Resp:  [14-18] 15 (12/31 1455) BP: (126-151)/(64-106) 126/67 (12/31 1455) SpO2:  [99 %-100 %] 100 % (12/31 1455)  General - Well nourished, well developed, intermittent agitation and trying to get out of bed.  Ophthalmologic - fundi not visualized due to noncooperation.  Cardiovascular - Regular rhythm and rate.  Neuro - awake alert, intermittent agitation and trying to get out of bed.  Not orientated to place, age and time.  Severe dysarthria, paucity of speech, able to name 3/4 and able to repeat simple sentences in dysarthric voice.  Able to follow most simple commands.  Right gaze preference, however able to gaze to left and tracking bilaterally.  Blinking to visual threat on the right, inconsistently blinking to visual threat on the left.  Right facial droop, tongue protrusion not corporative.  Right upper extremity 4/5 proximal and distal.  Right lower extremity proximal 3+/5 with drift and distal 3/5.  Right finger-to-nose intact, left finger-to-nose ataxic but proportional to the weakness.  Sensation not corporative.  Gait not tested.  ASSESSMENT/PLAN [name] is a 86 y.o. male with history of Parkinson's disease, dementia, Stroke, Migraine and prostate cancer presenting with worsening balance, gait instability, confusion and dysarthria.   Stroke: R MCA territory scattered infarct, likely secondary to large vessel diease with right M2 occlusion in the setting severe diffuse intracranial stenosis.  However, cardioembolic source cannot be completely ruled out.  MRI  R MCA territory infarct. Cytotoxic cerebral edema w/ possible petechial hemorrhage. Poor distal VA flow w/ reconstituted BA. Small vessel disease.   CTA head & neck right MCA posterior M2 branch occlusion.  Severe widespread posterior circulation stenosis, including severe tandem stenosis of the right PCA.  Occluded distal bilateral vertebral arteries, suboptimal  reconstitution of BA from bilateral PCOMs.  Severe stenosis left M3 branch, moderate stenosis right A2, mild stenosis right MCA origin.  Carotid Doppler  B ICA < 50% atherosclerosis, VA not seen  2D Echo EF 60-65%. No source of embolus   30 day cardiac event monitoring to rule out afib as outpt can be considered, but doubt for compliance and quality of the recording  LDL 165  HgbA1c 5.6  VTE prophylaxis -Lovenox  aspirin 81 mg daily prior to admission, now on aspirin 325 mg daily and clopidogrel 75 mg daily for 3 months and then ASA 325 alone given multifocal severe intracranial stenosis.  Therapy recommendations:  SNF  Disposition:  pending   Intracranial stenosis  MRI showed poor distal BA flow with reconstituted BA  CTA head & neck right MCA posterior M2 branch occlusion.  Severe widespread posterior circulation stenosis, including severe tandem stenosis of the right PCA.  Occluded distal bilateral vertebral arteries, suboptimal reconstitution of BA from bilateral PCOMs.  Severe stenosis left M3 branch, moderate stenosis right A2, mild stenosis right MCA origin.  Carotid Doppler not able to see vertebral arteries  On DAPT  Avoid low BP  Long-term BP goal 130-150  Hx stroke/TIA  12/2015 - MRI neg. No associated hospital admission. Not referenced in Epic notes.  Hypertension  Stable . Avoid low BP . Long-term BP goal 130-150 given severe intracranial stenosis  Hyperlipidemia  Home meds:  zocor 20  Now on lipitor 40  LDL 165, goal < 70  Continue statin at discharge  Other Stroke Risk Factors  Advanced Age >/= 19   Former Cigarette smoker, quit 35 yrs ago   Migraines  Other Active Problems  Prostate cancer in remission  Parkinson dementia  Parkinson Dz on Sinemet  Hospital day # 4  Neurology will sign off. Please call with questions. Pt will follow up with stroke clinic NP at [facility] in about 4 weeks. Thanks for the consult.  Rosalin Hawking, MD  PhD Stroke Neurology 07/31/2020 5:17 PM  I spent  35 minutes in total face-to-face time with the patient, more than 50% of which was spent in counseling and coordination of care, reviewing test results, images and medication, and discussing the diagnosis, treatment plan and potential prognosis. This patient's care requiresreview of multiple databases, neurological assessment, discussion with family, other specialists and medical decision making of high complexity.    To contact Stroke Continuity provider, please refer to [email or web address] After hours, contact General Neurology

## 2020-07-31 NOTE — Progress Notes (Signed)
PROGRESS NOTE    Jeffrey Blair   [id number]  DOB: Mar 31, 1928  DOA: 07/25/2020     4  PCP: [facility], [facility]  CC: Change in behavior, balance, speech  Hospital Course: Jeffrey Blair is a 86 yo male with PMH Parkinson's disease, dementia, debility, hx cerebellar CVA (seen on imaging but u/k to family until now), cervical DDD who was brought to the hospital for worsening balance/mobility, worsening confusion, and dysarthria.   Was taken to  [facility] ER, patient got agitated had a very long wait time,  signed out AMA by family then brought to [facility] the following day and ultimately now at [facility].   -MRI brain performed on admission showed chronic cerebellar infarcts.  There was also chronic bilateral white matter changes that had progressed since 2017.  MRI also showed an acute widespread infarct involving approximately a 7 cm area of the right MCA/PCA territory.  He also failed a swallow eval performed on 07/27/2020 with speech therapy.  -on 12/28: discussion between Jeffrey Blair with family, decision made for DNR  Interval History:  -No events overnight, poor p.o. intake  Assessment & Plan:  Acute CVA (cerebrovascular accident) ([place]) - per MRI brain: "Widespread patchy and confluent restricted diffusion in the posterior right MCA territory. The infarct is most confluent in a 7 cm area of the the right MCA/PCA watershed territory " - given age and underlying dementia/comorbidites, dysphagia patient prognosis was felt to be poor, Dr.Girgaus discussed this with pts Jeffrey Blair on 12/27 and 12/28 and made DNR - Tele neurology at [facility] recommended -asa/plavix,  -Appreciate neurology input, CTA neck with extensive intracranial stenosis and distal vertebral occlusion -2D echo with preserved EF, no cardiac source of embolus -LDL is 165, continue statin-dose increased, hemoglobin A1c is 5.6 - mild improvement in terms of swallow function -started on dysphagia 1 diet now,  oral intake remains poor -PT OT recommended SNF  Parkinson disease ([place]) Dementia -dys 1 diet started on 12/28; -Continue to monitor for tolerance -Continue Sinemet and primidone   Normocytic anemia -Stable  Hypertension -Labetalol as needed  Hyperlipidemia -Continue simvastatin  COPD (chronic obstructive pulmonary disease) (HCC) -Stable -Continue Incruse Ellipta   DVT prophylaxis: Lovenox Code Status: DNR Family Communication: No family at bedside, discussed with son Jeffrey Blair yesterday and son at bedside today  disposition Plan: Status is: Inpatient  Remains inpatient appropriate because:Altered mental status, Unsafe d/c plan and Inpatient level of care appropriate due to severity of illness   Dispo: The patient is from: Home              Anticipated d/c is to: SNF              Anticipated d/c date is: > Possibly 48 hours if remains stable              Patient currently is not medically stable to d/c.  Objective: Blood pressure (!) 146/81, pulse 67, temperature 97.8 F (36.6 C), temperature source Oral, resp. rate 16, height 6\' 1"  (1.854 m), weight 72.6 kg, SpO2 99 %.  Examination:  Gen: Awake alert, impulsive, oriented to self only, dysarthria noted, intermittently follows commands hate awake, alert, impulsive, oriented to self only, intermittently follows commands, confused HEENT:no JVD, facial droop Lungs: Diminished at the bases otherwise clear bilaterally CVS: S1-S2, regular rate rhythm Abdomen: Soft, nontender, bowel sounds present Extremities: Left lower leg with soft tissue swelling, deformity likely from prior injury skin: No rashes on exposed skin Neuro: Awake alert,  oriented to self only, dysarthria and facial droop noted intermittently follows commands  Consultants:   Neuro  Procedures:     Data Reviewed: I have personally reviewed following labs and imaging studies Results for orders placed or performed during the hospital encounter of  07/25/20 (from the past 24 hour(s))  CBC     Status: Abnormal   Collection Time: 07/31/20  3:44 AM  Result Value Ref Range   WBC 7.4 4.0 - 10.5 K/uL   RBC 4.34 4.22 - 5.81 MIL/uL   Hemoglobin 12.7 (L) 13.0 - 17.0 g/dL   HCT 38.1 (L) 39.0 - 52.0 %   MCV 87.8 80.0 - 100.0 fL   MCH 29.3 26.0 - 34.0 pg   MCHC 33.3 30.0 - 36.0 g/dL   RDW 12.7 11.5 - 15.5 %   Platelets 236 150 - 400 K/uL   nRBC 0.0 0.0 - 0.2 %  Basic metabolic panel     Status: Abnormal   Collection Time: 07/31/20  3:44 AM  Result Value Ref Range   Sodium 135 135 - 145 mmol/L   Potassium 3.5 3.5 - 5.1 mmol/L   Chloride 96 (L) 98 - 111 mmol/L   CO2 27 22 - 32 mmol/L   Glucose, Bld 98 70 - 99 mg/dL   BUN 7 (L) 8 - 23 mg/dL   Creatinine, Ser 0.62 0.61 - 1.24 mg/dL   Calcium 9.1 8.9 - 10.3 mg/dL   GFR, Estimated >60 >60 mL/min   Anion gap 12 5 - 15    Recent Results (from the past 240 hour(s))  Resp Panel by RT-PCR (Flu A&B, Covid) Nasopharyngeal Swab     Status: None   Collection Time: 07/25/20 11:10 PM   Specimen: Nasopharyngeal Swab; Nasopharyngeal(NP) swabs in vial transport medium  Result Value Ref Range Status   SARS Coronavirus 2 by RT PCR NEGATIVE NEGATIVE Final    Comment: (NOTE) SARS-CoV-2 target nucleic acids are NOT DETECTED.  The SARS-CoV-2 RNA is generally detectable in upper respiratory specimens during the acute phase of infection. The lowest concentration of SARS-CoV-2 viral copies this assay can detect is 138 copies/mL. A negative result does not preclude SARS-Cov-2 infection and should not be used as the sole basis for treatment or other patient management decisions. A negative result may occur with  improper specimen collection/handling, submission of specimen other than nasopharyngeal swab, presence of viral mutation(s) within the areas targeted by this assay, and inadequate number of viral copies(<138 copies/mL). A negative result must be combined with clinical observations, patient history,  and epidemiological information. The expected result is Negative.  Fact Sheet for Patients:  EntrepreneurPulse.com.au  Fact Sheet for Healthcare Providers:  IncredibleEmployment.be  This test is no t yet approved or cleared by the [place] FDA and  has been authorized for detection and/or diagnosis of SARS-CoV-2 by FDA under an Emergency Use Authorization (EUA). This EUA will remain  in effect (meaning this test can be used) for the duration of the COVID-19 declaration under Section 564(b)(1) of the Act, 21 U.S.C.section 360bbb-3(b)(1), unless the authorization is terminated  or revoked sooner.       Influenza A by PCR NEGATIVE NEGATIVE Final   Influenza B by PCR NEGATIVE NEGATIVE Final    Comment: (NOTE) The Xpert Xpress SARS-CoV-2/FLU/RSV plus assay is intended as an aid in the diagnosis of influenza from Nasopharyngeal swab specimens and should not be used as a sole basis for treatment. Nasal washings and aspirates are unacceptable for Xpert Xpress SARS-CoV-2/FLU/RSV testing.  Fact Sheet for Patients: EntrepreneurPulse.com.au  Fact Sheet for Healthcare Providers: IncredibleEmployment.be  This test is not yet approved or cleared by the [place] FDA and has been authorized for detection and/or diagnosis of SARS-CoV-2 by FDA under an Emergency Use Authorization (EUA). This EUA will remain in effect (meaning this test can be used) for the duration of the COVID-19 declaration under Section 564(b)(1) of the Act, 21 U.S.C. section 360bbb-3(b)(1), unless the authorization is terminated or revoked.  Performed at [facility], [address] [place], [place] [address]      Radiology Studies: CT ANGIO HEAD W OR WO CONTRAST  Addendum Date: 07/31/2020   ADDENDUM REPORT: 07/31/2020 11:23 ADDENDUM: Study discussed by telephone with Dr. Rosalin Hawking on 07/31/2020 at 1112 hours. Electronically Signed    By: Genevie Ann M.D.   On: 07/31/2020 11:23   Result Date: 07/31/2020 CLINICAL DATA:  86 year old male with recent posterior right MCA and right MCA/PCA watershed infarct on brain MRI 07/27/2020. CTA neck yesterday with bilateral vertebral artery severe stenoses, occlusion. Negative cervical carotid arteries. EXAM: CT ANGIOGRAPHY HEAD TECHNIQUE: Multidetector CT imaging of the head was performed using the standard protocol during bolus administration of intravenous contrast. Multiplanar CT image reconstructions and MIPs were obtained to evaluate the vascular anatomy. CONTRAST:  37mL OMNIPAQUE IOHEXOL 350 MG/ML SOLN COMPARISON:  Brain MRI 07/27/2020. CTA neck yesterday. Plain head CT 01/24/2016. FINDINGS: CT HEAD Brain: Confluent cytotoxic edema corresponding to the abnormal DWI in the posterior right MCA and right MCA/PCA watershed area (series 5, image 16). No significant mass effect. No definite associated acute hemorrhage. Background cerebral volume not significantly changed since 2017. No ventriculomegaly. No midline shift. Patchy bilateral cerebral white matter hypodensity has progressed since 2017. Gray-white matter differentiation in the deep gray nuclei, brainstem and cerebellum appear stable. Calvarium and skull base: No acute osseous abnormality identified. Paranasal sinuses: Visualized paranasal sinuses and mastoids are stable and well pneumatized. Orbits: No acute orbit or scalp soft tissue finding. CTA HEAD Posterior circulation: Poor enhancement of the distal vertebral arteries, which appear functionally occluded beyond the PICA origins. Poor enhancement of the proximal basilar artery, which appears reconstituted from posterior communicating arteries at the basilar tip. Most of the basilar artery does remain patent (see sagittal series 14, image 21). Patent SCA origins. Moderate left PCA P1 and P2 stenoses. Severe stenosis right PCA P1 origin although better contribution from the right posterior  communicating artery (series 12, image 24). Severe additional right P2 and PCA bifurcation stenoses (series 14, image 19). Bilateral distal PCA branches are patent but attenuated. Anterior circulation: Both ICA siphons are patent and somewhat dolichoectatic. Minimal siphon calcified plaque. No siphon stenosis. Normal ophthalmic and posterior communicating artery origins. Patent carotid termini. Patent MCA and ACA origins. Mild bilateral A1 irregularity without stenosis. Partially fenestrated appearing anterior communicating artery (normal variant). Left ACA branches are within normal limits. There is mild to moderate stenosis of the distal right A2 (series 14, image 21). Left MCA M1 segment and bifurcation are patent without stenosis. No left MCA branch occlusion is identified. There is generalized mild branch irregularity. There is a left posterior M3 branch severe stenosis at its origin on series 14, image 30. Right MCA origin is mildly irregular and stenotic. Right MCA M1 remains patent. But at the right MCA bifurcation there is an M2 branch occlusion (series 13, images 71 and 70). Remaining anterior division MCA branches appear mildly irregular. Venous sinuses: Early contrast timing, not well evaluated. Anatomic variants: None significant.  Review of the MIP images confirms the above findings IMPRESSION: 1. Positive for recent Large Vessel Occlusion: Right MCA posterior M2 branch. 2. Superimposed severe widespread posterior circulation stenosis, including severe tandem stenoses of the Right PCA which in conjunction with #1 likely explain the vascular territory involvement of the recent acute infarct (right MCA/PCA). 3. Positive also for Occluded Distal Vertebral Arteries as demonstrated on the neck yesterday. There is suboptimal reconstitution of the Basilar Artery from bilateral posterior communicating arteries. 4. Other intracranial atherosclerosis: Severe stenosis Left MCA posterior M3 branch. Moderate  stenosis Right ACA A2. Mild stenosis Right MCA origin. 5. Expected CT appearance of recent infarcts from #1 and #2. No malignant hemorrhagic transformation or mass effect. Electronically Signed: By: Odessa Fleming M.D. On: 07/31/2020 10:59   CT ANGIO NECK W OR WO CONTRAST  Result Date: 07/30/2020 CLINICAL DATA:  Acute neurologic deficit. EXAM: CT ANGIOGRAPHY NECK TECHNIQUE: Multidetector CT imaging of the neck was performed using the standard protocol during bolus administration of intravenous contrast. Multiplanar CT image reconstructions and MIPs were obtained to evaluate the vascular anatomy. Carotid stenosis measurements (when applicable) are obtained utilizing NASCET criteria, using the distal internal carotid diameter as the denominator. CONTRAST:  37mL OMNIPAQUE IOHEXOL 350 MG/ML SOLN COMPARISON:  None. FINDINGS: CTA NECK FINDINGS SKELETON: There is no bony spinal canal stenosis. No lytic or blastic lesion. OTHER NECK: Normal pharynx, larynx and major salivary glands. No cervical lymphadenopathy. Unremarkable thyroid gland. UPPER CHEST: No pneumothorax or pleural effusion. No nodules or masses. AORTIC ARCH: There is no calcific atherosclerosis of the aortic arch. There is no aneurysm, dissection or hemodynamically significant stenosis of the visualized portion of the aorta. Conventional 3 vessel aortic branching pattern. The visualized proximal subclavian arteries are widely patent. RIGHT CAROTID SYSTEM: Normal without aneurysm, dissection or stenosis. LEFT CAROTID SYSTEM: Normal without aneurysm, dissection or stenosis. VERTEBRAL ARTERIES: There is severe multifocal stenosis with multiple areas of occlusion both vertebral arteries involving all segments. IMPRESSION: 1. Severe multifocal stenosis of both vertebral arteries with multiple areas of occlusion, likely due to atherosclerotic disease. 2. No carotid stenosis. Electronically Signed   By: Deatra Robinson M.D.   On: 07/30/2020 23:10   CT ANGIO HEAD W OR  WO CONTRAST  Final Result  Addendum 1 of 1  ADDENDUM REPORT: 07/31/2020 11:23    ADDENDUM:  Study discussed by telephone with Dr. Marvel Plan on 07/31/2020 at  1112 hours.      Electronically Signed    By: Odessa Fleming M.D.    On: 07/31/2020 11:23      Final    CT ANGIO NECK W OR WO CONTRAST  Final Result    MR BRAIN WO CONTRAST  Final Result    US Carotid Bilateral (at [facility] and [facility] only)  Final Result      Scheduled Meds: . [START ON 08/01/2020] aspirin EC  325 mg Oral Daily  . atorvastatin  40 mg Oral Daily  . carbidopa-levodopa  3 tablet Oral BID  . clopidogrel  75 mg Oral Daily  . pantoprazole  40 mg Oral Daily  . primidone  150 mg Oral BID  . umeclidinium bromide  1 puff Inhalation Daily  . venlafaxine  37.5 mg Oral BID  . vitamin B-12  1,000 mcg Oral Daily   PRN Meds: acetaminophen **OR** acetaminophen, albuterol, haloperidol lactate, labetalol, loratadine, meclizine, ondansetron **OR** ondansetron (ZOFRAN) IV, polyvinyl alcohol Continuous Infusions:    LOS: 4 days  Time spent:  Zannie Cove, MD  [facility] 07/31/2020, 2:23 PM

## 2020-08-01 DIAGNOSIS — G20 Parkinson's disease: Secondary | ICD-10-CM | POA: Diagnosis not present

## 2020-08-01 LAB — CBC
HCT: 36.8 % — ABNORMAL LOW (ref 39.0–52.0)
Hemoglobin: 12.8 g/dL — ABNORMAL LOW (ref 13.0–17.0)
MCH: 30.1 pg (ref 26.0–34.0)
MCHC: 34.8 g/dL (ref 30.0–36.0)
MCV: 86.6 fL (ref 80.0–100.0)
Platelets: 267 10*3/uL (ref 150–400)
RBC: 4.25 MIL/uL (ref 4.22–5.81)
RDW: 12.9 % (ref 11.5–15.5)
WBC: 6.8 10*3/uL (ref 4.0–10.5)
nRBC: 0 % (ref 0.0–0.2)

## 2020-08-01 LAB — BASIC METABOLIC PANEL
Anion gap: 12 (ref 5–15)
BUN: 8 mg/dL (ref 8–23)
CO2: 29 mmol/L (ref 22–32)
Calcium: 9.4 mg/dL (ref 8.9–10.3)
Chloride: 94 mmol/L — ABNORMAL LOW (ref 98–111)
Creatinine, Ser: 0.7 mg/dL (ref 0.61–1.24)
GFR, Estimated: >60 mL/min (ref >60)
Glucose, Bld: 99 mg/dL (ref 70–99)
Potassium: 3.3 mmol/L — ABNORMAL LOW (ref 3.5–5.1)
Sodium: 135 mmol/L (ref 135–145)

## 2020-08-01 MED ORDER — POTASSIUM CHLORIDE 20 MEQ PO PACK
40.0000 meq | PACK | Freq: Once | ORAL | Status: AC
  Administered 2020-08-01: 40 meq via ORAL
  Filled 2020-08-01: qty 2

## 2020-08-01 MED ORDER — IBUPROFEN 200 MG PO TABS
400.0000 mg | ORAL_TABLET | Freq: Once | ORAL | Status: AC
  Administered 2020-08-01: 400 mg via ORAL
  Filled 2020-08-01: qty 2

## 2020-08-01 NOTE — TOC Progression Note (Signed)
Transition of Care [facility]) - Progression Note    Patient Details  Name: Jeffrey Blair MRN: [id number] Date of Birth: 1927/12/17  [facility] CM/SW Contact  Carley Hammed, [place] Phone Number: 08/01/2020, 9:22 AM  Clinical Narrative:    CSW gave bed offers to pt's son [name]. He is familiar with [facility] and noted they would be a last resort or a solid no, as they had bad experiences there. He noted that their preferred is [facility]. CSW called [facility], but admissions is out for today. CSW will continue to follow for DC.   Expected Discharge Plan: Skilled Nursing Facility Barriers to Discharge: Continued Medical Work up  Expected Discharge Plan and Services Expected Discharge Plan: Skilled Nursing Facility In-house Referral: Clinical Social Work   Post Acute Care Choice: Skilled Nursing Facility Living arrangements for the past 2 months: Single Family Home                                       Social Determinants of Health (SDOH) Interventions    Readmission Risk Interventions No flowsheet data found.

## 2020-08-01 NOTE — Progress Notes (Signed)
PROGRESS NOTE    Jeffrey Blair   YIF:[id number]  DOB: [birth year]  DOA: 07/25/2020     5  PCP: [facility]  CC: Change in behavior, balance, speech  Hospital Course: Jeffrey Blair is a 87 yo male with PMH Parkinson's disease, dementia, debility, hx cerebellar CVA, cervical DDD who was brought to the hospital for worsening balance/mobility, worsening confusion, and dysarthria.   Was taken to  [facility] ER, patient got agitated had a very long wait time,  signed out AMA by family then brought to [facility] the following day and then ultimately to [facility].   -MRI also showed an acute widespread infarct involving approximately a 7 cm area of the right MCA/PCA territory, in addition to chronic infarcts He also failed a swallow eval performed on 07/27/2020 with speech therapy.  -on 12/28: discussion between Dr.Girgaus with family, decision made for DNR  Interval History:  -Had some headache overnight, on a dysphagia 1 diet however only eat small amounts of this  Assessment & Plan:  Acute CVA (cerebrovascular accident) (HCC) - per MRI brain: "Widespread patchy and confluent restricted diffusion in the posterior right MCA territory. The infarct is most confluent in a 7 cm area of the the right MCA/PCA watershed territory " -Appreciate neurology input, CTA neck with extensive intracranial stenosis and distal vertebral occlusion -Neurology recommended aspirin 325 mg daily and Plavix 75 mg daily for 3 months followed by aspirin 325 mg daily -Also recommended to consider 30-day monitor -2D echo with preserved EF, no cardiac source of embolus -LDL is 165, continue statin-dose increased, hemoglobin A1c is 5.6 -After persistent dysphagia, showed mild improvement in swallow function -eventually started on dysphagia 1 diet now, oral intake remains poor -PT OT recommended SNF -Discharge planning  Parkinson disease (HCC) Dementia Dysphagia -Was n.p.o. with severe dysphagia  and initially, eventually started on a dysphagia 1 diet -Continue Sinemet and primidone   Normocytic anemia -Stable  Hypertension -Labetalol as needed  Hyperlipidemia -Continue simvastatin  COPD (chronic obstructive pulmonary disease) (HCC) -Stable -Continue Incruse Ellipta   DVT prophylaxis: Lovenox Code Status: DNR Family Communication: No family at bedside, discussed with son Jeffrey Blair yesterday and son at bedside today  disposition Plan: Status is: Inpatient  Remains inpatient appropriate because:Altered mental status, Unsafe d/c plan and Inpatient level of care appropriate due to severity of illness   Dispo: The patient is from: Home              Anticipated d/c is to: SNF              Anticipated d/c date is: Monday 1/3              Patient currently is not medically stable to d/c.  Objective: Blood pressure 124/67, pulse 73, temperature 98.1 F (36.7 C), resp. rate 16, height 6\' 1"  (1.854 m), weight 72.6 kg, SpO2 97 %.  Examination:  Gen: Awake alert oriented to self only, impulsive, dysarthria improving, follows commands, partly confused HEENT:no JVD, R facial droop Lungs: Diminished at the bases otherwise clear bilaterally CVS: S1-S2, regular rate rhythm Abdomen: Soft, nontender, bowel sounds present Extremities: Left lower leg with soft tissue swelling, deformity likely from prior injury skin: No rashes on exposed skin Neuro: Awake alert, oriented to self only, dysarthria , R facial droop noted intermittently follows commands, right lower extremity is 3+-4/5  Consultants:   Neuro  Procedures:     Data Reviewed: I have personally reviewed following labs and imaging studies  Results for orders placed or performed during the hospital encounter of 07/25/20 (from the past 24 hour(s))  CBC     Status: Abnormal   Collection Time: 08/01/20  1:51 AM  Result Value Ref Range   WBC 6.8 4.0 - 10.5 K/uL   RBC 4.25 4.22 - 5.81 MIL/uL   Hemoglobin 12.8 (L) 13.0 -  17.0 g/dL   HCT 36.8 (L) 39.0 - 52.0 %   MCV 86.6 80.0 - 100.0 fL   MCH 30.1 26.0 - 34.0 pg   MCHC 34.8 30.0 - 36.0 g/dL   RDW 12.9 11.5 - 15.5 %   Platelets 267 150 - 400 K/uL   nRBC 0.0 0.0 - 0.2 %  Basic metabolic panel     Status: Abnormal   Collection Time: 08/01/20  1:51 AM  Result Value Ref Range   Sodium 135 135 - 145 mmol/L   Potassium 3.3 (L) 3.5 - 5.1 mmol/L   Chloride 94 (L) 98 - 111 mmol/L   CO2 29 22 - 32 mmol/L   Glucose, Bld 99 70 - 99 mg/dL   BUN 8 8 - 23 mg/dL   Creatinine, Ser 0.70 0.61 - 1.24 mg/dL   Calcium 9.4 8.9 - 10.3 mg/dL   GFR, Estimated >60 >60 mL/min   Anion gap 12 5 - 15    Recent Results (from the past 240 hour(s))  Resp Panel by RT-PCR (Flu A&B, Covid) Nasopharyngeal Swab     Status: None   Collection Time: 07/25/20 11:10 PM   Specimen: Nasopharyngeal Swab; Nasopharyngeal(NP) swabs in vial transport medium  Result Value Ref Range Status   SARS Coronavirus 2 by RT PCR NEGATIVE NEGATIVE Final    Comment: (NOTE) SARS-CoV-2 target nucleic acids are NOT DETECTED.  The SARS-CoV-2 RNA is generally detectable in upper respiratory specimens during the acute phase of infection. The lowest concentration of SARS-CoV-2 viral copies this assay can detect is 138 copies/mL. A negative result does not preclude SARS-Cov-2 infection and should not be used as the sole basis for treatment or other patient management decisions. A negative result may occur with  improper specimen collection/handling, submission of specimen other than nasopharyngeal swab, presence of viral mutation(s) within the areas targeted by this assay, and inadequate number of viral copies(<138 copies/mL). A negative result must be combined with clinical observations, patient history, and epidemiological information. The expected result is Negative.  Fact Sheet for Patients:  EntrepreneurPulse.com.au  Fact Sheet for Healthcare Providers:   IncredibleEmployment.be  This test is no t yet approved or cleared by the [place] FDA and  has been authorized for detection and/or diagnosis of SARS-CoV-2 by FDA under an Emergency Use Authorization (EUA). This EUA will remain  in effect (meaning this test can be used) for the duration of the COVID-19 declaration under Section 564(b)(1) of the Act, 21 U.S.C.section 360bbb-3(b)(1), unless the authorization is terminated  or revoked sooner.       Influenza A by PCR NEGATIVE NEGATIVE Final   Influenza B by PCR NEGATIVE NEGATIVE Final    Comment: (NOTE) The Xpert Xpress SARS-CoV-2/FLU/RSV plus assay is intended as an aid in the diagnosis of influenza from Nasopharyngeal swab specimens and should not be used as a sole basis for treatment. Nasal washings and aspirates are unacceptable for Xpert Xpress SARS-CoV-2/FLU/RSV testing.  Fact Sheet for Patients: EntrepreneurPulse.com.au  Fact Sheet for Healthcare Providers: IncredibleEmployment.be  This test is not yet approved or cleared by the [place] FDA and has been authorized for detection and/or  diagnosis of SARS-CoV-2 by FDA under an Emergency Use Authorization (EUA). This EUA will remain in effect (meaning this test can be used) for the duration of the COVID-19 declaration under Section 564(b)(1) of the Act, 21 U.S.C. section 360bbb-3(b)(1), unless the authorization is terminated or revoked.  Performed at [facility], [address] [place],  [address]      Radiology Studies: CT ANGIO HEAD W OR WO CONTRAST  Addendum Date: 07/31/2020   ADDENDUM REPORT: 07/31/2020 11:23 ADDENDUM: Study discussed by telephone with Dr. Rosalin Hawking on 07/31/2020 at 1112 hours. Electronically Signed   By: Genevie Ann M.D.   On: 07/31/2020 11:23   Result Date: 07/31/2020 CLINICAL DATA:  87 year old male with recent posterior right MCA and right MCA/PCA watershed infarct on brain  MRI 07/27/2020. CTA neck yesterday with bilateral vertebral artery severe stenoses, occlusion. Negative cervical carotid arteries. EXAM: CT ANGIOGRAPHY HEAD TECHNIQUE: Multidetector CT imaging of the head was performed using the standard protocol during bolus administration of intravenous contrast. Multiplanar CT image reconstructions and MIPs were obtained to evaluate the vascular anatomy. CONTRAST:  61mL OMNIPAQUE IOHEXOL 350 MG/ML SOLN COMPARISON:  Brain MRI 07/27/2020. CTA neck yesterday. Plain head CT 01/24/2016. FINDINGS: CT HEAD Brain: Confluent cytotoxic edema corresponding to the abnormal DWI in the posterior right MCA and right MCA/PCA watershed area (series 5, image 16). No significant mass effect. No definite associated acute hemorrhage. Background cerebral volume not significantly changed since 2017. No ventriculomegaly. No midline shift. Patchy bilateral cerebral white matter hypodensity has progressed since 2017. Gray-white matter differentiation in the deep gray nuclei, brainstem and cerebellum appear stable. Calvarium and skull base: No acute osseous abnormality identified. Paranasal sinuses: Visualized paranasal sinuses and mastoids are stable and well pneumatized. Orbits: No acute orbit or scalp soft tissue finding. CTA HEAD Posterior circulation: Poor enhancement of the distal vertebral arteries, which appear functionally occluded beyond the PICA origins. Poor enhancement of the proximal basilar artery, which appears reconstituted from posterior communicating arteries at the basilar tip. Most of the basilar artery does remain patent (see sagittal series 14, image 21). Patent SCA origins. Moderate left PCA P1 and P2 stenoses. Severe stenosis right PCA P1 origin although better contribution from the right posterior communicating artery (series 12, image 24). Severe additional right P2 and PCA bifurcation stenoses (series 14, image 19). Bilateral distal PCA branches are patent but attenuated.  Anterior circulation: Both ICA siphons are patent and somewhat dolichoectatic. Minimal siphon calcified plaque. No siphon stenosis. Normal ophthalmic and posterior communicating artery origins. Patent carotid termini. Patent MCA and ACA origins. Mild bilateral A1 irregularity without stenosis. Partially fenestrated appearing anterior communicating artery (normal variant). Left ACA branches are within normal limits. There is mild to moderate stenosis of the distal right A2 (series 14, image 21). Left MCA M1 segment and bifurcation are patent without stenosis. No left MCA branch occlusion is identified. There is generalized mild branch irregularity. There is a left posterior M3 branch severe stenosis at its origin on series 14, image 30. Right MCA origin is mildly irregular and stenotic. Right MCA M1 remains patent. But at the right MCA bifurcation there is an M2 branch occlusion (series 13, images 71 and 70). Remaining anterior division MCA branches appear mildly irregular. Venous sinuses: Early contrast timing, not well evaluated. Anatomic variants: None significant. Review of the MIP images confirms the above findings IMPRESSION: 1. Positive for recent Large Vessel Occlusion: Right MCA posterior M2 branch. 2. Superimposed severe widespread posterior circulation stenosis, including severe tandem stenoses  of the Right PCA which in conjunction with #1 likely explain the vascular territory involvement of the recent acute infarct (right MCA/PCA). 3. Positive also for Occluded Distal Vertebral Arteries as demonstrated on the neck yesterday. There is suboptimal reconstitution of the Basilar Artery from bilateral posterior communicating arteries. 4. Other intracranial atherosclerosis: Severe stenosis Left MCA posterior M3 branch. Moderate stenosis Right ACA A2. Mild stenosis Right MCA origin. 5. Expected CT appearance of recent infarcts from #1 and #2. No malignant hemorrhagic transformation or mass effect. Electronically  Signed: By: Genevie Ann M.D. On: 07/31/2020 10:59   CT ANGIO NECK W OR WO CONTRAST  Result Date: 07/30/2020 CLINICAL DATA:  Acute neurologic deficit. EXAM: CT ANGIOGRAPHY NECK TECHNIQUE: Multidetector CT imaging of the neck was performed using the standard protocol during bolus administration of intravenous contrast. Multiplanar CT image reconstructions and MIPs were obtained to evaluate the vascular anatomy. Carotid stenosis measurements (when applicable) are obtained utilizing NASCET criteria, using the distal internal carotid diameter as the denominator. CONTRAST:  77mL OMNIPAQUE IOHEXOL 350 MG/ML SOLN COMPARISON:  None. FINDINGS: CTA NECK FINDINGS SKELETON: There is no bony spinal canal stenosis. No lytic or blastic lesion. OTHER NECK: Normal pharynx, larynx and major salivary glands. No cervical lymphadenopathy. Unremarkable thyroid gland. UPPER CHEST: No pneumothorax or pleural effusion. No nodules or masses. AORTIC ARCH: There is no calcific atherosclerosis of the aortic arch. There is no aneurysm, dissection or hemodynamically significant stenosis of the visualized portion of the aorta. Conventional 3 vessel aortic branching pattern. The visualized proximal subclavian arteries are widely patent. RIGHT CAROTID SYSTEM: Normal without aneurysm, dissection or stenosis. LEFT CAROTID SYSTEM: Normal without aneurysm, dissection or stenosis. VERTEBRAL ARTERIES: There is severe multifocal stenosis with multiple areas of occlusion both vertebral arteries involving all segments. IMPRESSION: 1. Severe multifocal stenosis of both vertebral arteries with multiple areas of occlusion, likely due to atherosclerotic disease. 2. No carotid stenosis. Electronically Signed   By: Ulyses Jarred M.D.   On: 07/30/2020 23:10   CT ANGIO HEAD W OR WO CONTRAST  Final Result  Addendum 1 of 1  ADDENDUM REPORT: 07/31/2020 11:23    ADDENDUM:  Study discussed by telephone with Dr. Rosalin Hawking on 07/31/2020 at  1112 hours.       Electronically Signed    By: Genevie Ann M.D.    On: 07/31/2020 11:23      Final    CT ANGIO NECK W OR WO CONTRAST  Final Result    MR BRAIN WO CONTRAST  Final Result    US Carotid Bilateral (at [facility] and [facility] only)  Final Result      Scheduled Meds: . aspirin EC  325 mg Oral Daily  . atorvastatin  40 mg Oral Daily  . carbidopa-levodopa  3 tablet Oral BID  . clopidogrel  75 mg Oral Daily  . enoxaparin (LOVENOX) injection  40 mg Subcutaneous Q24H  . pantoprazole  40 mg Oral Daily  . primidone  150 mg Oral BID  . umeclidinium bromide  1 puff Inhalation Daily  . venlafaxine  37.5 mg Oral BID  . vitamin B-12  1,000 mcg Oral Daily   PRN Meds: acetaminophen **OR** acetaminophen, albuterol, haloperidol lactate, labetalol, loratadine, meclizine, ondansetron **OR** ondansetron (ZOFRAN) IV, polyvinyl alcohol Continuous Infusions:    LOS: 5 days  Time spent: 89min  Domenic Polite, MD [facility] 08/01/2020, 11:35 AM

## 2020-08-01 NOTE — Plan of Care (Signed)
  Problem: Education: Goal: Knowledge of disease or condition will improve Outcome: Not Progressing Goal: Knowledge of secondary prevention will improve Outcome: Not Progressing Goal: Knowledge of patient specific risk factors addressed and post discharge goals established will improve Outcome: Not Progressing Goal: Individualized Educational Video(s) Outcome: Not Progressing   Problem: Coping: Goal: Will verbalize positive feelings about self Outcome: Not Progressing   Problem: Health Behavior/Discharge Planning: Goal: Ability to manage health-related needs will improve Outcome: Not Progressing   Problem: Self-Care: Goal: Ability to participate in self-care as condition permits will improve Outcome: Not Progressing Goal: Verbalization of feelings and concerns over difficulty with self-care will improve Outcome: Not Progressing Goal: Ability to communicate needs accurately will improve Outcome: Not Progressing   Problem: Nutrition: Goal: Risk of aspiration will decrease Outcome: Not Progressing Goal: Dietary intake will improve Outcome: Not Progressing   Problem: Education: Goal: Knowledge of disease or condition will improve Outcome: Not Progressing   Problem: Intracerebral Hemorrhage Tissue Perfusion: Goal: Complications of Intracerebral Hemorrhage will be minimized Outcome: Not Progressing   Problem: Ischemic Stroke/TIA Tissue Perfusion: Goal: Complications of ischemic stroke/TIA will be minimized Outcome: Not Progressing

## 2020-08-01 NOTE — Progress Notes (Signed)
  Speech Language Pathology Treatment: Dysphagia  Patient Details Name: Jeffrey Blair MRN: [id number] DOB: 02-14-1928 Today's Date: 08/01/2020 Time: 6948-5462 SLP Time Calculation (min) (ACUTE ONLY): 18 min  Assessment / Plan / Recommendation Clinical Impression  Pt seen for ongoing dysphagia intervention. Pt was resting in room upon arrival due to headache he experienced earlier. Sitter was in room and very attentive to his needs and assisted with feeding after SLP visit. Pt with oral pooling of saliva with labial spillage when he went to speak, Pt with reduced vocal intensity. He consumed puree textures and thin liquids via straw sips. Pt with improved control with straw (tried with cup). He requested a "soda" and his face lit up in in a broad smile when given a soda (able to self present with assist). He continues to exhibit oral holding and labial spillage, however no overt signs of reduced airway protection. Continue diet as ordered, D1/puree and thin liquids. Recommend SNF after D/C.    HPI HPI: Jeffrey Blair is a 87 y.o. male with medical history significant of prostate cancer in remission, history of other nonhemorrhagic CVA, TIA, Parkinson's disease, migraine headaches, cervical disc disease, dementia who is brought to the emergency department by his son from [place] after he was taken due to AMS secondary to acute other nonhemorrhagic CVA. MRI Head 07/27/20: Acute to subacute infarct in the posterior right MCA territory  infarct, and especially at the right MCA/PCA watershed (fetal type PCA origins)      SLP Plan  Continue with current plan of care       Recommendations  Diet recommendations: Dysphagia 1 (puree);Thin liquid Liquids provided via: Cup;Straw Medication Administration: Whole meds with puree Supervision: Staff to assist with self feeding;Full supervision/cueing for compensatory strategies Compensations: Slow rate;Small sips/bites Postural Changes and/or Swallow  Maneuvers: Seated upright 90 degrees;Upright 30-60 min after meal                Oral Care Recommendations: Oral care BID;Staff/trained caregiver to provide oral care Follow up Recommendations: Skilled Nursing facility SLP Visit Diagnosis: Dysphagia, oropharyngeal phase (R13.12) Plan: Continue with current plan of care       Thank you,  Havery Moros, CCC-SLP [phone]                 Nikki Glanzer 08/01/2020, 1:02 PM

## 2020-08-02 DIAGNOSIS — I10 Essential (primary) hypertension: Secondary | ICD-10-CM

## 2020-08-02 DIAGNOSIS — J441 Chronic obstructive pulmonary disease with (acute) exacerbation: Secondary | ICD-10-CM | POA: Diagnosis not present

## 2020-08-02 MED ORDER — KETOROLAC TROMETHAMINE 15 MG/ML IJ SOLN
15.0000 mg | Freq: Once | INTRAMUSCULAR | Status: AC
  Administered 2020-08-02: 15 mg via INTRAVENOUS
  Filled 2020-08-02: qty 1

## 2020-08-02 MED ORDER — KETOROLAC TROMETHAMINE 15 MG/ML IJ SOLN
15.0000 mg | Freq: Three times a day (TID) | INTRAMUSCULAR | Status: DC | PRN
  Administered 2020-08-02 – 2020-08-06 (×7): 15 mg via INTRAVENOUS
  Filled 2020-08-02 (×8): qty 1

## 2020-08-02 NOTE — Plan of Care (Signed)
Pt alert and oriented x2. 1:1 sitter utilized. Pt was restless around 0100 but music made he calm. Pt is on RA and follows commands. Tele SR. SCD's off because it cause agitation. C/o HA at the beginning of shift. HA decreased after scheduled meds were given.   Problem: Education: Goal: Knowledge of disease or condition will improve Outcome: Progressing Goal: Knowledge of secondary prevention will improve Outcome: Progressing Goal: Knowledge of patient specific risk factors addressed and post discharge goals established will improve Outcome: Progressing Goal: Individualized Educational Video(s) Outcome: Progressing   Problem: Coping: Goal: Will verbalize positive feelings about self Outcome: Progressing   Problem: Health Behavior/Discharge Planning: Goal: Ability to manage health-related needs will improve Outcome: Progressing   Problem: Self-Care: Goal: Ability to participate in self-care as condition permits will improve Outcome: Progressing Goal: Verbalization of feelings and concerns over difficulty with self-care will improve Outcome: Progressing Goal: Ability to communicate needs accurately will improve Outcome: Progressing   Problem: Nutrition: Goal: Risk of aspiration will decrease Outcome: Progressing Goal: Dietary intake will improve Outcome: Progressing   Problem: Ischemic Stroke/TIA Tissue Perfusion: Goal: Complications of ischemic stroke/TIA will be minimized Outcome: Progressing   Problem: Education: Goal: Knowledge of General Education information will improve Description: Including pain rating scale, medication(s)/side effects and non-pharmacologic comfort measures Outcome: Progressing   Problem: Health Behavior/Discharge Planning: Goal: Ability to manage health-related needs will improve Outcome: Progressing   Problem: Clinical Measurements: Goal: Ability to maintain clinical measurements within normal limits will improve Outcome: Progressing Goal:  Will remain free from infection Outcome: Progressing Goal: Diagnostic test results will improve Outcome: Progressing Goal: Respiratory complications will improve Outcome: Progressing Goal: Cardiovascular complication will be avoided Outcome: Progressing   Problem: Activity: Goal: Risk for activity intolerance will decrease Outcome: Progressing   Problem: Nutrition: Goal: Adequate nutrition will be maintained Outcome: Progressing   Problem: Coping: Goal: Level of anxiety will decrease Outcome: Progressing   Problem: Elimination: Goal: Will not experience complications related to bowel motility Outcome: Progressing Goal: Will not experience complications related to urinary retention Outcome: Progressing   Problem: Pain Managment: Goal: General experience of comfort will improve Outcome: Progressing   Problem: Safety: Goal: Ability to remain free from injury will improve Outcome: Progressing   Problem: Skin Integrity: Goal: Risk for impaired skin integrity will decrease Outcome: Progressing   Problem: Education: Goal: Knowledge of disease or condition will improve Outcome: Progressing   Problem: Intracerebral Hemorrhage Tissue Perfusion: Goal: Complications of Intracerebral Hemorrhage will be minimized Outcome: Progressing   Problem: Ischemic Stroke/TIA Tissue Perfusion: Goal: Complications of ischemic stroke/TIA will be minimized Outcome: Progressing   Problem: Spontaneous Subarachnoid Hemorrhage Tissue Perfusion: Goal: Complications of Spontaneous Subarachnoid Hemorrhage will be minimized Outcome: Progressing

## 2020-08-02 NOTE — Progress Notes (Signed)
PROGRESS NOTE    Jeffrey Blair  VOJ:[id number] DOB: 03-24-1928 DOA: 07/25/2020 PCP: [facility]   Brief Narrative: Taken from prior notes. Jeffrey Blair is a 87 yo male with PMH Parkinson's disease, dementia, debility, hx cerebellar CVA, cervical DDD who was brought to the hospital for worsening balance/mobility, worsening confusion, and dysarthria.   Was taken to  [facility] ER, patient got agitated had a very long wait time,  signed out AMA by family then brought to [facility] the following day and then ultimately to [facility].   -MRI also showed an acute widespread infarct involving approximately a 7 cm area of the right MCA/PCA territory, in addition to chronic infarcts He also failed a swallow eval performed on 07/27/2020 with speech therapy, later started on dysphagia 1 diet. -on 12/28: discussion between Dr.Girgaus with family, decision made for DNR  Subjective: Patient was having some frontal headache, improved with Tylenol and using glasses.  No other complaint.  Assessment & Plan:   Active Problems:   COPD (chronic obstructive pulmonary disease) (HCC)   Parkinson disease (HCC)   Hyperlipidemia   Hypertension   Normocytic anemia   Acute CVA (cerebrovascular accident) (HCC)  Acute CVA (cerebrovascular accident) (HCC) - per MRI brain: "Widespread patchy and confluent restricted diffusion in the posterior right MCA territory. The infarct is most confluent in a 7 cm area of the the right MCA/PCA watershed territory " -Appreciate neurology input, CTA neck with extensive intracranial stenosis and distal vertebral occlusion -Neurology recommended aspirin 325 mg daily and Plavix 75 mg daily for 3 months followed by aspirin 325 mg daily -Also recommended to consider 30-day monitor -2D echo with preserved EF, no cardiac source of embolus -LDL is 165, continue statin-dose increased, hemoglobin A1c is 5.6 -After persistent dysphagia, showed mild improvement in  swallow function -eventually started on dysphagia 1 diet now, oral intake remains poor -PT OT recommended SNF  Parkinson disease (HCC)/Dementia/ Dysphagia. -Was n.p.o. with severe dysphagia and initially, eventually started on a dysphagia 1 diet -Continue Sinemet and primidone  Normocytic anemia -Stable  Hypertension.  Blood pressure within goal. -Labetalol as needed  Hyperlipidemia -Continue simvastatin  COPD (chronic obstructive pulmonary disease) (HCC) -Stable -Continue Incruse Ellipta   Objective: Vitals:   08/01/20 2356 08/02/20 0737 08/02/20 0743 08/02/20 1315  BP: 124/67 128/64  129/71  Pulse: 73 70  88  Resp: 15 16  17   Temp: 98.5 F (36.9 C) 97.9 F (36.6 C)  97.6 F (36.4 C)  TempSrc: Oral Oral  Oral  SpO2: 97% 95% 96% 96%  Weight:      Height:        Intake/Output Summary (Last 24 hours) at 08/02/2020 1451 Last data filed at 08/02/2020 1317 Gross per 24 hour  Intake 130 ml  Output 425 ml  Net -295 ml   Filed Weights   07/25/20 1925  Weight: 72.6 kg    Examination:  General exam: Chronically ill-appearing elderly man, some drooling, very hard of hearing, appears calm and comfortable  Respiratory system: Clear to auscultation. Respiratory effort normal. Cardiovascular system: S1 & S2 heard, RRR. Gastrointestinal system: Soft, nontender, nondistended, bowel sounds positive. Central nervous system: Alert and oriented.  Right facial droop. Extremities: No edema, no cyanosis, pulses intact and symmetrical. Psychiatry: Judgement and insight appear impaired.   DVT prophylaxis: Lovenox Code Status: DNR Family Communication: Son was updated at bedside Disposition Plan:  Status is: Inpatient  Remains inpatient appropriate because:Inpatient level of care appropriate due to severity  of illness   Dispo: The patient is from: Home              Anticipated d/c is to: SNF              Anticipated d/c date is: 1 day              Patient currently is  medically stable to d/c.  Consultants:   Neurology  Procedures:  Antimicrobials:   Data Reviewed: I have personally reviewed following labs and imaging studies  CBC: Recent Labs  Lab 07/30/20 0319 07/31/20 0344 08/01/20 0151  WBC 5.7 7.4 6.8  HGB 11.2* 12.7* 12.8*  HCT 33.8* 38.1* 36.8*  MCV 88.0 87.8 86.6  PLT 213 236 [id number]   Basic Metabolic Panel: Recent Labs  Lab 07/30/20 0319 07/31/20 0344 08/01/20 0151  NA 133* 135 135  K 3.4* 3.5 3.3*  CL 98 96* 94*  CO2 26 27 29   GLUCOSE 101* 98 99  BUN 7* 7* 8  CREATININE 0.56* 0.62 0.70  CALCIUM 8.7* 9.1 9.4   GFR: Estimated Creatinine Clearance: 63 mL/min (by C-G formula based on SCr of 0.7 mg/dL). Liver Function Tests: No results for input(s): AST, ALT, ALKPHOS, BILITOT, PROT, ALBUMIN in the last 168 hours. No results for input(s): LIPASE, AMYLASE in the last 168 hours. No results for input(s): AMMONIA in the last 168 hours. Coagulation Profile: No results for input(s): INR, PROTIME in the last 168 hours. Cardiac Enzymes: No results for input(s): CKTOTAL, CKMB, CKMBINDEX, TROPONINI in the last 168 hours. BNP (last 3 results) No results for input(s): PROBNP in the last 8760 hours. HbA1C: No results for input(s): HGBA1C in the last 72 hours. CBG: Recent Labs  Lab 07/30/20 1137  GLUCAP 139*   Lipid Profile: No results for input(s): CHOL, HDL, LDLCALC, TRIG, CHOLHDL, LDLDIRECT in the last 72 hours. Thyroid Function Tests: No results for input(s): TSH, T4TOTAL, FREET4, T3FREE, THYROIDAB in the last 72 hours. Anemia Panel: No results for input(s): VITAMINB12, FOLATE, FERRITIN, TIBC, IRON, RETICCTPCT in the last 72 hours. Sepsis Labs: No results for input(s): PROCALCITON, LATICACIDVEN in the last 168 hours.  Recent Results (from the past 240 hour(s))  Resp Panel by RT-PCR (Flu A&B, Covid) Nasopharyngeal Swab     Status: None   Collection Time: 07/25/20 11:10 PM   Specimen: Nasopharyngeal Swab; Nasopharyngeal(NP)  swabs in vial transport medium  Result Value Ref Range Status   SARS Coronavirus 2 by RT PCR NEGATIVE NEGATIVE Final    Comment: (NOTE) SARS-CoV-2 target nucleic acids are NOT DETECTED.  The SARS-CoV-2 RNA is generally detectable in upper respiratory specimens during the acute phase of infection. The lowest concentration of SARS-CoV-2 viral copies this assay can detect is 138 copies/mL. A negative result does not preclude SARS-Cov-2 infection and should not be used as the sole basis for treatment or other patient management decisions. A negative result may occur with  improper specimen collection/handling, submission of specimen other than nasopharyngeal swab, presence of viral mutation(s) within the areas targeted by this assay, and inadequate number of viral copies(<138 copies/mL). A negative result must be combined with clinical observations, patient history, and epidemiological information. The expected result is Negative.  Fact Sheet for Patients:  EntrepreneurPulse.com.au  Fact Sheet for Healthcare Providers:  IncredibleEmployment.be  This test is no t yet approved or cleared by the [place] FDA and  has been authorized for detection and/or diagnosis of SARS-CoV-2 by FDA under an Emergency Use Authorization (EUA). This EUA will  remain  in effect (meaning this test can be used) for the duration of the COVID-19 declaration under Section 564(b)(1) of the Act, 21 U.S.C.section 360bbb-3(b)(1), unless the authorization is terminated  or revoked sooner.       Influenza A by PCR NEGATIVE NEGATIVE Final   Influenza B by PCR NEGATIVE NEGATIVE Final    Comment: (NOTE) The Xpert Xpress SARS-CoV-2/FLU/RSV plus assay is intended as an aid in the diagnosis of influenza from Nasopharyngeal swab specimens and should not be used as a sole basis for treatment. Nasal washings and aspirates are unacceptable for Xpert Xpress  SARS-CoV-2/FLU/RSV testing.  Fact Sheet for Patients: EntrepreneurPulse.com.au  Fact Sheet for Healthcare Providers: IncredibleEmployment.be  This test is not yet approved or cleared by the [place] FDA and has been authorized for detection and/or diagnosis of SARS-CoV-2 by FDA under an Emergency Use Authorization (EUA). This EUA will remain in effect (meaning this test can be used) for the duration of the COVID-19 declaration under Section 564(b)(1) of the Act, 21 U.S.C. section 360bbb-3(b)(1), unless the authorization is terminated or revoked.  Performed at [facility], [address] [place], [place] [address]      Radiology Studies: No results found.  Scheduled Meds: . aspirin EC  325 mg Oral Daily  . atorvastatin  40 mg Oral Daily  . carbidopa-levodopa  3 tablet Oral BID  . clopidogrel  75 mg Oral Daily  . enoxaparin (LOVENOX) injection  40 mg Subcutaneous Q24H  . pantoprazole  40 mg Oral Daily  . primidone  150 mg Oral BID  . umeclidinium bromide  1 puff Inhalation Daily  . venlafaxine  37.5 mg Oral BID  . vitamin B-12  1,000 mcg Oral Daily   Continuous Infusions:   LOS: 6 days   Time spent: 35 minutes  Lorella Nimrod, MD [facility]  If 7PM-7AM, please contact night-coverage [email or web address]  08/02/2020, 2:51 PM   This record has been created using Systems analyst. Errors have been sought and corrected,but may not always be located. Such creation errors do not reflect on the standard of care.

## 2020-08-02 NOTE — Plan of Care (Signed)
Pt is pleasant and calm today, disoriented x 3 with altered mental status. Pt continues to complain of frontal headache, unrelieved by PRN medication. MD aware. New medication tried this evening, see MAR for details. Family members have been at the bedside and are pleasant and supportive. Incont., condom cath in place, no BM this shift. Pt is comfortable, clean and resting in bed with music for relaxation.   Problem: Education: Goal: Knowledge of disease or condition will improve Outcome: Not Progressing Goal: Knowledge of secondary prevention will improve Outcome: Not Progressing Goal: Knowledge of patient specific risk factors addressed and post discharge goals established will improve Outcome: Not Progressing Goal: Individualized Educational Video(s) Outcome: Not Progressing   Problem: Coping: Goal: Will verbalize positive feelings about self Outcome: Not Progressing   Problem: Health Behavior/Discharge Planning: Goal: Ability to manage health-related needs will improve Outcome: Not Progressing   Problem: Self-Care: Goal: Ability to participate in self-care as condition permits will improve Outcome: Not Progressing Goal: Verbalization of feelings and concerns over difficulty with self-care will improve Outcome: Not Progressing Goal: Ability to communicate needs accurately will improve Outcome: Not Progressing   Problem: Nutrition: Goal: Risk of aspiration will decrease Outcome: Not Progressing Goal: Dietary intake will improve Outcome: Not Progressing   Problem: Ischemic Stroke/TIA Tissue Perfusion: Goal: Complications of ischemic stroke/TIA will be minimized Outcome: Not Progressing   Problem: Education: Goal: Knowledge of disease or condition will improve Outcome: Not Progressing   Problem: Intracerebral Hemorrhage Tissue Perfusion: Goal: Complications of Intracerebral Hemorrhage will be minimized Outcome: Not Progressing   Problem: Ischemic Stroke/TIA Tissue  Perfusion: Goal: Complications of ischemic stroke/TIA will be minimized Outcome: Not Progressing   Problem: Education: Goal: Knowledge of General Education information will improve Description: Including pain rating scale, medication(s)/side effects and non-pharmacologic comfort measures Outcome: Not Progressing   Problem: Spontaneous Subarachnoid Hemorrhage Tissue Perfusion: Goal: Complications of Spontaneous Subarachnoid Hemorrhage will be minimized Outcome: Not Progressing   Problem: Health Behavior/Discharge Planning: Goal: Ability to manage health-related needs will improve Outcome: Not Progressing   Problem: Clinical Measurements: Goal: Ability to maintain clinical measurements within normal limits will improve Outcome: Not Progressing Goal: Will remain free from infection Outcome: Not Progressing Goal: Diagnostic test results will improve Outcome: Not Progressing Goal: Respiratory complications will improve Outcome: Not Progressing Goal: Cardiovascular complication will be avoided Outcome: Not Progressing

## 2020-08-03 ENCOUNTER — Inpatient Hospital Stay (HOSPITAL_COMMUNITY): Payer: PPO

## 2020-08-03 DIAGNOSIS — I10 Essential (primary) hypertension: Secondary | ICD-10-CM | POA: Diagnosis not present

## 2020-08-03 DIAGNOSIS — J441 Chronic obstructive pulmonary disease with (acute) exacerbation: Secondary | ICD-10-CM | POA: Diagnosis not present

## 2020-08-03 LAB — TROPONIN I (HIGH SENSITIVITY)
Troponin I (High Sensitivity): 6 ng/L (ref <18)
Troponin I (High Sensitivity): 5 ng/L (ref <18)

## 2020-08-03 MED ORDER — POLYETHYLENE GLYCOL 3350 17 G PO PACK
17.0000 g | PACK | Freq: Every day | ORAL | Status: DC
  Administered 2020-08-03 – 2020-08-18 (×9): 17 g via ORAL
  Filled 2020-08-03 (×13): qty 1

## 2020-08-03 NOTE — Social Work (Signed)
                                                                                                                          Re: Jeffrey Blair Date of Birth: August 25, 1927 Date: 08/03/2020  To Whom It May Concern:  Please be advised that the above-named patient will require a short-term nursing home stay-anticipated 30 days or less for rehabilitation and strengthening. The plan is to return home.

## 2020-08-03 NOTE — Progress Notes (Signed)
Physical Therapy Treatment Patient Details Name: Jeffrey Blair MRN: [id number] DOB: 1928/01/24 Today's Date: 08/03/2020    History of Present Illness 87 yo male admitted to [facility] from [place] for AMS. CTA demonstrates R proximal MCA posterior division occlusion with distal reconstitution, severe stenosis of proximal basilar and P2 of R PCA. MRI Head 07/27/20 demonstrates acute to subacute infarct in the posterior right MCA territory  infarct, and especially at the right MCA/PCA watershed. Pt and family left AMA, returned. PMH includes parkinson's disease, CVA, prostate cancer, TIA, migraines, cervical disc disease, dementia.    PT Comments    Pt c/o headache at 10/10, constantly reaching for head and stating its throbbing and "I"m not going to make it". RN has given pain medicine however not improving. Pt unable to tolerate sitting EOB or standing without falling forward and collapsing head into patients hands requiring maxAX2 to prevent fall. RN aware and reports he is going for a repeat CT scan. Acute PT to cont to follow.    Follow Up Recommendations  SNF;Supervision/Assistance - 24 hour;Supervision for mobility/OOB     Equipment Recommendations  None recommended by PT    Recommendations for Other Services       Precautions / Restrictions Precautions Precautions: Fall Restrictions Weight Bearing Restrictions: No    Mobility  Bed Mobility Overal bed mobility: Needs Assistance Bed Mobility: Supine to Sit;Sit to Supine     Supine to sit: Mod assist Sit to supine: Mod assist   General bed mobility comments: maxAx2 to scoot up in bed, assist for LE management back into bed, assist for trunk elevation, pt with significant trunk flexion  Transfers Overall transfer level: Needs assistance Equipment used: Rolling walker (2 wheeled) Transfers: Sit to/from Stand Sit to Stand: Mod assist;+2 physical assistance         General transfer comment: modAx2 to power  up, pt unable to hold head up, pt with progressive trunk flexion and unable to maintain standing  Ambulation/Gait             General Gait Details: unable this date   Stairs             Wheelchair Mobility    Modified Rankin (Stroke Patients Only) Modified Rankin (Stroke Patients Only) Pre-Morbid Rankin Score: Moderate disability Modified Rankin: Severe disability     Balance Overall balance assessment: Needs assistance Sitting-balance support: No upper extremity supported;Feet supported Sitting balance-Leahy Scale: Poor Sitting balance - Comments: pt kept falling forward and holding head, unable to maintain sitting EOB Postural control: Other (comment) Standing balance support:  (kept falling forward) Standing balance-Leahy Scale: Poor Standing balance comment: dependent on external support                            Cognition Arousal/Alertness: Awake/alert Behavior During Therapy: Flat affect Overall Cognitive Status: History of cognitive impairments - at baseline                                 General Comments: baseline dementia, pt wtih severe headache, pt holding head stating "I"m not going to make it"      Exercises      General Comments General comments (skin integrity, edema, etc.): pt with large swollen area on anterior L lower leg, suspect it has been there PTA      Pertinent Vitals/Pain Pain Assessment: Faces Faces Pain Scale:  Hurts worst Pain Location: headache Pain Descriptors / Indicators: Headache;Throbbing Pain Intervention(s): Patient requesting pain meds-RN notified    Home Living                      Prior Function            PT Goals (current goals can now be found in the care plan section) Progress towards PT goals: Not progressing toward goals - comment (regression due to headache)    Frequency    Min 3X/week      PT Plan Current plan remains appropriate    Co-evaluation               AM-PAC PT "6 Clicks" Mobility   Outcome Measure  Help needed turning from your back to your side while in a flat bed without using bedrails?: A Lot Help needed moving from lying on your back to sitting on the side of a flat bed without using bedrails?: A Lot Help needed moving to and from a bed to a chair (including a wheelchair)?: A Lot Help needed standing up from a chair using your arms (e.g., wheelchair or bedside chair)?: A Lot Help needed to walk in hospital room?: Total Help needed climbing 3-5 steps with a railing? : Total 6 Click Score: 10    End of Session Equipment Utilized During Treatment: Gait belt Activity Tolerance: Patient tolerated treatment well Patient left: in bed;with call bell/phone within reach;with bed alarm set (chair position in bed) Nurse Communication: Mobility status PT Visit Diagnosis: Unsteadiness on feet (R26.81);Other abnormalities of gait and mobility (R26.89);Muscle weakness (generalized) (M62.81);Difficulty in walking, not elsewhere classified (R26.2);Other symptoms and signs involving the nervous system (R29.898)     Time: 4076-8088 PT Time Calculation (min) (ACUTE ONLY): 23 min  Charges:  $Therapeutic Activity: 23-37 mins                     Lewis Shock, PT, DPT Acute Rehabilitation Services Pager #: [phone] Office #: [phone]    Iona Hansen 08/03/2020, 1:55 PM

## 2020-08-03 NOTE — Plan of Care (Addendum)
Pt is pleasant and calm today, disoriented x 3 with altered mental status, continues to complain of frontal headache, unrelieved by PRN medication. MD aware, new medication onboard and head CT ordered, copmpleted this evening. Family member (son) at bedside since this afternoon, pleasant and supportive. Incontinent, condom cath in place, no BM this shift, daily Miralax ordered per MD. Pt is comfortable, clean and resting in bed with music for relaxation.    Problem: Education: Goal: Knowledge of disease or condition will improve Outcome: Not Progressing Goal: Knowledge of secondary prevention will improve Outcome: Not Progressing Goal: Knowledge of patient specific risk factors addressed and post discharge goals established will improve Outcome: Not Progressing Goal: Individualized Educational Video(s) Outcome: Not Progressing   Problem: Coping: Goal: Will verbalize positive feelings about self Outcome: Not Progressing   Problem: Health Behavior/Discharge Planning: Goal: Ability to manage health-related needs will improve Outcome: Not Progressing   Problem: Self-Care: Goal: Ability to participate in self-care as condition permits will improve Outcome: Not Progressing Goal: Verbalization of feelings and concerns over difficulty with self-care will improve Outcome: Not Progressing Goal: Ability to communicate needs accurately will improve Outcome: Not Progressing   Problem: Nutrition: Goal: Risk of aspiration will decrease Outcome: Not Progressing Goal: Dietary intake will improve Outcome: Not Progressing   Problem: Ischemic Stroke/TIA Tissue Perfusion: Goal: Complications of ischemic stroke/TIA will be minimized Outcome: Not Progressing   Problem: Education: Goal: Knowledge of disease or condition will improve Outcome: Not Progressing   Problem: Ischemic Stroke/TIA Tissue Perfusion: Goal: Complications of ischemic stroke/TIA will be minimized Outcome: Not Progressing    Problem: Spontaneous Subarachnoid Hemorrhage Tissue Perfusion: Goal: Complications of Spontaneous Subarachnoid Hemorrhage will be minimized Outcome: Not Progressing

## 2020-08-03 NOTE — Progress Notes (Signed)
PROGRESS NOTE    Jeffrey Blair  [id number] DOB: 28-Oct-1927 DOA: 07/25/2020 PCP: [facility]   Brief Narrative: Taken from prior notes. Jeffrey Blair is a 87 yo male with PMH Parkinson's disease, dementia, debility, hx cerebellar CVA, cervical DDD who was brought to the hospital for worsening balance/mobility, worsening confusion, and dysarthria.   Was taken to  [facility] ER, patient got agitated had a very long wait time,  signed out AMA by family then brought to [facility] the following day and then ultimately to [facility]   -MRI also showed an acute widespread infarct involving approximately a 7 cm area of the right MCA/PCA territory, in addition to chronic infarcts He also failed a swallow eval performed on 07/27/2020 with speech therapy, later started on dysphagia 1 diet. -on 12/28: discussion between [place] with family, decision made for DNR  Subjective: Patient continued to complaint about frontal and bitemporal headache, stating that do something about it.  Tylenol is not working and Toradol is giving partial relief.  Denies any nausea or vomiting.  No acute change in his vision.  Assessment & Plan:   Active Problems:   COPD (chronic obstructive pulmonary disease) (HCC)   Parkinson disease (HCC)   Hyperlipidemia   Hypertension   Normocytic anemia   Acute CVA (cerebrovascular accident) ([place])   Acute ischemic stroke ([place])  Acute CVA (cerebrovascular accident) ([place]) - per MRI brain: "Widespread patchy and confluent restricted diffusion in the posterior right MCA territory. The infarct is most confluent in a 7 cm area of the the right MCA/PCA watershed territory " -Appreciate neurology input, CTA neck with extensive intracranial stenosis and distal vertebral occlusion -Neurology recommended aspirin 325 mg daily and Plavix 75 mg daily for 3 months followed by aspirin 325 mg daily -Also recommended to consider 30-day monitor -2D echo with preserved EF,  no cardiac source of embolus -LDL is 165, continue statin-dose increased, hemoglobin A1c is 5.6 -After persistent dysphagia, showed mild improvement in swallow function -eventually started on dysphagia 1 diet now, oral intake remains poor -PT OT recommended SNF  Persistent headache.  Can be post stroke.  Initial MRI with some surrounding edema.  No new deficit. -We will repeat CT head to rule out any worsening edema or hemorrhagic conversion.  Parkinson disease (HCC)/Dementia/ Dysphagia. -Was n.p.o. with severe dysphagia and initially, eventually started on a dysphagia 1 diet -Continue Sinemet and primidone  Normocytic anemia -Stable  Hypertension.  Blood pressure within goal. -Labetalol as needed  Hyperlipidemia -Continue simvastatin  COPD (chronic obstructive pulmonary disease) (HCC) -Stable -Continue Incruse Ellipta   Objective: Vitals:   08/03/20 0744 08/03/20 1144 08/03/20 1258 08/03/20 1336  BP:  124/65 103/64 118/62  Pulse:  76 93 75  Resp:  19 (!) 40 17  Temp: 98.3 F (36.8 C) 98.1 F (36.7 C)    TempSrc: Axillary Oral    SpO2:  96% 97% 97%  Weight:      Height:        Intake/Output Summary (Last 24 hours) at 08/03/2020 1525 Last data filed at 08/03/2020 1522 Gross per 24 hour  Intake 440 ml  Output --  Net 440 ml   Filed Weights   07/25/20 1925  Weight: 72.6 kg    Examination:  General.  Chronically ill-appearing elderly man, in no acute distress. Pulmonary.  Lungs clear bilaterally, normal respiratory effort. CV.  Regular rate and rhythm, no JVD, rub or murmur. Abdomen.  Soft, nontender, nondistended, BS positive. CNS.  Alert  and oriented x3.  No focal neurologic deficit. Extremities.  No edema, no cyanosis, pulses intact and symmetrical.   DVT prophylaxis: Lovenox Code Status: DNR Family Communication: Son was updated on phone. Disposition Plan:  Status is: Inpatient  Remains inpatient appropriate because:Inpatient level of care  appropriate due to severity of illness   Dispo: The patient is from: Home              Anticipated d/c is to: SNF              Anticipated d/c date is: 1 day              Patient currently is medically stable to d/c.  Pending insurance authorization.  Consultants:   Neurology  Procedures:  Antimicrobials:   Data Reviewed: I have personally reviewed following labs and imaging studies  CBC: Recent Labs  Lab 07/30/20 0319 07/31/20 0344 08/01/20 0151  WBC 5.7 7.4 6.8  HGB 11.2* 12.7* 12.8*  HCT 33.8* 38.1* 36.8*  MCV 88.0 87.8 86.6  PLT 213 236 [id number]   Basic Metabolic Panel: Recent Labs  Lab 07/30/20 0319 07/31/20 0344 08/01/20 0151  NA 133* 135 135  K 3.4* 3.5 3.3*  CL 98 96* 94*  CO2 26 27 29   GLUCOSE 101* 98 99  BUN 7* 7* 8  CREATININE 0.56* 0.62 0.70  CALCIUM 8.7* 9.1 9.4   GFR: Estimated Creatinine Clearance: 63 mL/min (by C-G formula based on SCr of 0.7 mg/dL). Liver Function Tests: No results for input(s): AST, ALT, ALKPHOS, BILITOT, PROT, ALBUMIN in the last 168 hours. No results for input(s): LIPASE, AMYLASE in the last 168 hours. No results for input(s): AMMONIA in the last 168 hours. Coagulation Profile: No results for input(s): INR, PROTIME in the last 168 hours. Cardiac Enzymes: No results for input(s): CKTOTAL, CKMB, CKMBINDEX, TROPONINI in the last 168 hours. BNP (last 3 results) No results for input(s): PROBNP in the last 8760 hours. HbA1C: No results for input(s): HGBA1C in the last 72 hours. CBG: Recent Labs  Lab 07/30/20 1137  GLUCAP 139*   Lipid Profile: No results for input(s): CHOL, HDL, LDLCALC, TRIG, CHOLHDL, LDLDIRECT in the last 72 hours. Thyroid Function Tests: No results for input(s): TSH, T4TOTAL, FREET4, T3FREE, THYROIDAB in the last 72 hours. Anemia Panel: No results for input(s): VITAMINB12, FOLATE, FERRITIN, TIBC, IRON, RETICCTPCT in the last 72 hours. Sepsis Labs: No results for input(s): PROCALCITON, LATICACIDVEN in  the last 168 hours.  Recent Results (from the past 240 hour(s))  Resp Panel by RT-PCR (Flu A&B, Covid) Nasopharyngeal Swab     Status: None   Collection Time: 07/25/20 11:10 PM   Specimen: Nasopharyngeal Swab; Nasopharyngeal(NP) swabs in vial transport medium  Result Value Ref Range Status   SARS Coronavirus 2 by RT PCR NEGATIVE NEGATIVE Final    Comment: (NOTE) SARS-CoV-2 target nucleic acids are NOT DETECTED.  The SARS-CoV-2 RNA is generally detectable in upper respiratory specimens during the acute phase of infection. The lowest concentration of SARS-CoV-2 viral copies this assay can detect is 138 copies/mL. A negative result does not preclude SARS-Cov-2 infection and should not be used as the sole basis for treatment or other patient management decisions. A negative result may occur with  improper specimen collection/handling, submission of specimen other than nasopharyngeal swab, presence of viral mutation(s) within the areas targeted by this assay, and inadequate number of viral copies(<138 copies/mL). A negative result must be combined with clinical observations, patient history, and epidemiological information. The expected  result is Negative.  Fact Sheet for Patients:  BloggerCourse.com  Fact Sheet for Healthcare Providers:  SeriousBroker.it  This test is no t yet approved or cleared by the [place] FDA and  has been authorized for detection and/or diagnosis of SARS-CoV-2 by FDA under an Emergency Use Authorization (EUA). This EUA will remain  in effect (meaning this test can be used) for the duration of the COVID-19 declaration under Section 564(b)(1) of the Act, 21 U.S.C.section 360bbb-3(b)(1), unless the authorization is terminated  or revoked sooner.       Influenza A by PCR NEGATIVE NEGATIVE Final   Influenza B by PCR NEGATIVE NEGATIVE Final    Comment: (NOTE) The Xpert Xpress SARS-CoV-2/FLU/RSV plus assay  is intended as an aid in the diagnosis of influenza from Nasopharyngeal swab specimens and should not be used as a sole basis for treatment. Nasal washings and aspirates are unacceptable for Xpert Xpress SARS-CoV-2/FLU/RSV testing.  Fact Sheet for Patients: BloggerCourse.com  Fact Sheet for Healthcare Providers: SeriousBroker.it  This test is not yet approved or cleared by the [place] FDA and has been authorized for detection and/or diagnosis of SARS-CoV-2 by FDA under an Emergency Use Authorization (EUA). This EUA will remain in effect (meaning this test can be used) for the duration of the COVID-19 declaration under Section 564(b)(1) of the Act, 21 U.S.C. section 360bbb-3(b)(1), unless the authorization is terminated or revoked.  Performed at [facility], [address], [place], [place] [address]      Radiology Studies: No results found.  Scheduled Meds: . aspirin EC  325 mg Oral Daily  . atorvastatin  40 mg Oral Daily  . carbidopa-levodopa  3 tablet Oral BID  . clopidogrel  75 mg Oral Daily  . enoxaparin (LOVENOX) injection  40 mg Subcutaneous Q24H  . pantoprazole  40 mg Oral Daily  . primidone  150 mg Oral BID  . umeclidinium bromide  1 puff Inhalation Daily  . venlafaxine  37.5 mg Oral BID  . vitamin B-12  1,000 mcg Oral Daily   Continuous Infusions:   LOS: 7 days   Time spent: 30 minutes  Arnetha Courser, MD [facility]  If 7PM-7AM, please contact night-coverage [email or web address]  08/03/2020, 3:25 PM   This record has been created using Conservation officer, historic buildings. Errors have been sought and corrected,but may not always be located. Such creation errors do not reflect on the standard of care.

## 2020-08-03 NOTE — TOC Progression Note (Signed)
Transition of Care [facility]) - Progression Note    Patient Details  Name: KASSEM KIBBE MRN: [id number] Date of Birth: April 26, 1928  [facility] CM/SW Contact  Eduard Roux, [place] Phone Number: 08/03/2020, 3:36 PM  Clinical Narrative:     CSW called patient's son provided update- no confirmed bed offer- CSW has contacted [facility]- they will review and call CSW back.  PASRR remains pending- [facility] requesting 30 day note, address and updated progress note- will fax requested information.  TOC will continue to follow and assist with discharge planning.  Antony Blackbird, MSW, LCSW Clinical Social Worker   Expected Discharge Plan: Skilled Nursing Facility Barriers to Discharge: Continued Medical Work up  Expected Discharge Plan and Services Expected Discharge Plan: Skilled Nursing Facility In-house Referral: Clinical Social Work   Post Acute Care Choice: Skilled Nursing Facility Living arrangements for the past 2 months: Single Family Home                                       Social Determinants of Health (SDOH) Interventions    Readmission Risk Interventions No flowsheet data found.

## 2020-08-03 NOTE — Progress Notes (Signed)
Nurse called to room by fellow staff d/t patient complaining of chest pain and nausea. Upon assessment, vs were stable (see flowsheets), pt sitting upright with minimal emesis (mostly saliva) and talking. A&O to person and situation, which has been baseline since this morning assessment. EKG performed, MD notified, orders pending. Pt resting in bed now with call bell.

## 2020-08-04 DIAGNOSIS — J441 Chronic obstructive pulmonary disease with (acute) exacerbation: Secondary | ICD-10-CM | POA: Diagnosis not present

## 2020-08-04 DIAGNOSIS — I10 Essential (primary) hypertension: Secondary | ICD-10-CM | POA: Diagnosis not present

## 2020-08-04 LAB — BASIC METABOLIC PANEL
Anion gap: 12 (ref 5–15)
BUN: 22 mg/dL (ref 8–23)
CO2: 25 mmol/L (ref 22–32)
Calcium: 8.9 mg/dL (ref 8.9–10.3)
Chloride: 96 mmol/L — ABNORMAL LOW (ref 98–111)
Creatinine, Ser: 0.81 mg/dL (ref 0.61–1.24)
GFR, Estimated: >60 mL/min (ref >60)
Glucose, Bld: 80 mg/dL (ref 70–99)
Potassium: 3.9 mmol/L (ref 3.5–5.1)
Sodium: 133 mmol/L — ABNORMAL LOW (ref 135–145)

## 2020-08-04 LAB — CBC
HCT: 41 % (ref 39.0–52.0)
Hemoglobin: 14.2 g/dL (ref 13.0–17.0)
MCH: 30 pg (ref 26.0–34.0)
MCHC: 34.6 g/dL (ref 30.0–36.0)
MCV: 86.5 fL (ref 80.0–100.0)
Platelets: 189 10*3/uL (ref 150–400)
RBC: 4.74 MIL/uL (ref 4.22–5.81)
RDW: 12.9 % (ref 11.5–15.5)
WBC: 4.3 10*3/uL (ref 4.0–10.5)
nRBC: 0 % (ref 0.0–0.2)

## 2020-08-04 LAB — RESP PANEL BY RT-PCR (FLU A&B, COVID) ARPGX2
Influenza A by PCR: NEGATIVE
Influenza B by PCR: NEGATIVE
SARS Coronavirus 2 by RT PCR: NEGATIVE

## 2020-08-04 MED ORDER — TRAMADOL HCL 50 MG PO TABS
50.0000 mg | ORAL_TABLET | Freq: Two times a day (BID) | ORAL | Status: DC | PRN
  Administered 2020-08-04 – 2020-08-05 (×2): 50 mg via ORAL
  Filled 2020-08-04 (×2): qty 1

## 2020-08-04 MED ORDER — POTASSIUM CHLORIDE 10 MEQ/100ML IV SOLN
10.0000 meq | Freq: Once | INTRAVENOUS | Status: AC
  Administered 2020-08-05: 10 meq via INTRAVENOUS
  Filled 2020-08-04: qty 100

## 2020-08-04 MED ORDER — MAGNESIUM SULFATE IN D5W 1-5 GM/100ML-% IV SOLN
1.0000 g | Freq: Once | INTRAVENOUS | Status: AC
  Administered 2020-08-05: 1 g via INTRAVENOUS
  Filled 2020-08-04: qty 100

## 2020-08-04 NOTE — TOC Progression Note (Signed)
Transition of Care [facility]) - Progression Note    Patient Details  Name: Jeffrey Blair MRN: [id number] Date of Birth: [birth year]  [facility]) CM/SW Contact  Eduard Roux, [place] Phone Number: 08/04/2020, 4:35 PM  Clinical Narrative:     CSW received call from [employer] - insurance has denied SNF approval. CSW informed MD Nelson Chimes, and she has decided not to do pursue peer to peer.   PTAR was approved # [id number] CSW called and informed the patient's son,Jeffrey Blair of HTA decision.    Antony Blackbird, MSW, LCSW Clinical Social Worker   Expected Discharge Plan: Skilled Nursing Facility Barriers to Discharge: Continued Medical Work up  Expected Discharge Plan and Services Expected Discharge Plan: Skilled Nursing Facility In-house Referral: Clinical Social Work   Post Acute Care Choice: Skilled Nursing Facility Living arrangements for the past 2 months: Single Family Home                                       Social Determinants of Health (SDOH) Interventions    Readmission Risk Interventions No flowsheet data found.

## 2020-08-04 NOTE — Progress Notes (Addendum)
  Speech Language Pathology Treatment: Dysphagia  Patient Details Name: Jeffrey Blair MRN: [id number] DOB: [birth year] Today's Date: 08/04/2020 Time: 8546-2703 SLP Time Calculation (min) (ACUTE ONLY): 11 min  Assessment / Plan / Recommendation Clinical Impression  Pt was distractible today, not wanting to eat much and saying that he "isn't feeling well" but denying any pain (RN aware). He consumed a small amount of thin liquids and purees, still exhibiting oral holding and anterior spillage that require Mod cues to address. Was going to use yankauer at the end to remove a mild amount of residue that was pooling in his anterior sulcus, but it spilled out of his mouth as soon as he opened it. He is not ready for any diet advancements. Will continue Dys 1 diet and thin liquids.    HPI HPI: Jeffrey Blair is a 87 y.o. male with medical history significant of prostate cancer in remission, history of other nonhemorrhagic CVA, TIA, Parkinson's disease, migraine headaches, cervical disc disease, dementia who is brought to the emergency department by his son from [place] after he was taken due to AMS secondary to acute other nonhemorrhagic CVA. MRI Head 07/27/20: Acute to subacute infarct in the posterior right MCA territory  infarct, and especially at the right MCA/PCA watershed (fetal type PCA origins)      SLP Plan  Continue with current plan of care       Recommendations  Diet recommendations: Dysphagia 1 (puree);Thin liquid Liquids provided via: Cup;Straw Medication Administration: Crushed with puree Supervision: Staff to assist with self feeding;Full supervision/cueing for compensatory strategies Compensations: Slow rate;Small sips/bites Postural Changes and/or Swallow Maneuvers: Seated upright 90 degrees;Upright 30-60 min after meal                Oral Care Recommendations: Oral care BID;Staff/trained caregiver to provide oral care Follow up Recommendations: Skilled Nursing  facility SLP Visit Diagnosis: Dysphagia, oropharyngeal phase (R13.12) Plan: Continue with current plan of care       GO                Mahala Menghini., M.A. CCC-SLP Acute Rehabilitation Services Pager [phone] Office [phone]  08/04/2020, 12:34 PM

## 2020-08-04 NOTE — Plan of Care (Signed)
  Problem: Education: Goal: Knowledge of disease or condition will improve Outcome: Progressing Goal: Knowledge of secondary prevention will improve Outcome: Progressing Goal: Knowledge of patient specific risk factors addressed and post discharge goals established will improve Outcome: Progressing Goal: Individualized Educational Video(s) Outcome: Progressing   Problem: Coping: Goal: Will verbalize positive feelings about self Outcome: Progressing   Problem: Health Behavior/Discharge Planning: Goal: Ability to manage health-related needs will improve Outcome: Progressing   Problem: Self-Care: Goal: Ability to participate in self-care as condition permits will improve Outcome: Progressing Goal: Verbalization of feelings and concerns over difficulty with self-care will improve Outcome: Progressing Goal: Ability to communicate needs accurately will improve Outcome: Progressing   Problem: Nutrition: Goal: Risk of aspiration will decrease Outcome: Progressing Goal: Dietary intake will improve Outcome: Progressing   Problem: Ischemic Stroke/TIA Tissue Perfusion: Goal: Complications of ischemic stroke/TIA will be minimized Outcome: Progressing   Problem: Education: Goal: Knowledge of General Education information will improve Description: Including pain rating scale, medication(s)/side effects and non-pharmacologic comfort measures Outcome: Progressing   Problem: Health Behavior/Discharge Planning: Goal: Ability to manage health-related needs will improve Outcome: Progressing   Problem: Clinical Measurements: Goal: Ability to maintain clinical measurements within normal limits will improve Outcome: Progressing Goal: Will remain free from infection Outcome: Progressing Goal: Diagnostic test results will improve Outcome: Progressing Goal: Respiratory complications will improve Outcome: Progressing Goal: Cardiovascular complication will be avoided Outcome: Progressing    Problem: Activity: Goal: Risk for activity intolerance will decrease Outcome: Progressing   Problem: Nutrition: Goal: Adequate nutrition will be maintained Outcome: Progressing   Problem: Coping: Goal: Level of anxiety will decrease Outcome: Progressing   Problem: Elimination: Goal: Will not experience complications related to bowel motility Outcome: Progressing Goal: Will not experience complications related to urinary retention Outcome: Progressing   Problem: Pain Managment: Goal: General experience of comfort will improve Outcome: Progressing   Problem: Safety: Goal: Ability to remain free from injury will improve Outcome: Progressing   Problem: Skin Integrity: Goal: Risk for impaired skin integrity will decrease Outcome: Progressing   Problem: Education: Goal: Knowledge of disease or condition will improve Outcome: Progressing   Problem: Intracerebral Hemorrhage Tissue Perfusion: Goal: Complications of Intracerebral Hemorrhage will be minimized Outcome: Progressing   Problem: Ischemic Stroke/TIA Tissue Perfusion: Goal: Complications of ischemic stroke/TIA will be minimized Outcome: Progressing   Problem: Spontaneous Subarachnoid Hemorrhage Tissue Perfusion: Goal: Complications of Spontaneous Subarachnoid Hemorrhage will be minimized Outcome: Progressing   

## 2020-08-04 NOTE — Progress Notes (Signed)
PROGRESS NOTE    Jeffrey Blair  PIR:[id number] DOB: July 03, 1928 DOA: 07/25/2020 PCP: [facility]   Brief Narrative: Taken from prior notes. Jeffrey Blair is a 87 yo male with PMH Parkinson's disease, dementia, debility, hx cerebellar CVA, cervical DDD who was brought to the hospital for worsening balance/mobility, worsening confusion, and dysarthria.   Was taken to  [facility] ER, patient got agitated had a very long wait time,  signed out AMA by family then brought to [facility] the following day and then ultimately to [facility].   -MRI also showed an acute widespread infarct involving approximately a 7 cm area of the right MCA/PCA territory, in addition to chronic infarcts He also failed a swallow eval performed on 07/27/2020 with speech therapy, later started on dysphagia 1 diet. -on 12/28: discussion between Dr.Girgaus with family, decision made for DNR  Subjective: Per patient he is still having some headache but better than before.  No nausea or vomiting.  He was resting comfortably.  Feeling overall weak.  Assessment & Plan:   Active Problems:   COPD (chronic obstructive pulmonary disease) (HCC)   Parkinson disease (HCC)   Hyperlipidemia   Hypertension   Normocytic anemia   Acute CVA (cerebrovascular accident) (HCC)   Acute ischemic stroke (HCC)  Acute CVA (cerebrovascular accident) (HCC) - per MRI brain: "Widespread patchy and confluent restricted diffusion in the posterior right MCA territory. The infarct is most confluent in a 7 cm area of the the right MCA/PCA watershed territory " -Appreciate neurology input, CTA neck with extensive intracranial stenosis and distal vertebral occlusion -Neurology recommended aspirin 325 mg daily and Plavix 75 mg daily for 3 months followed by aspirin 325 mg daily -Also recommended to consider 30-day monitor -2D echo with preserved EF, no cardiac source of embolus -LDL is 165, continue statin-dose increased, hemoglobin  A1c is 5.6 -After persistent dysphagia, showed mild improvement in swallow function -eventually started on dysphagia 1 diet now, oral intake remains poor -PT OT recommended SNF  Persistent headache.  Can be post stroke.  Initial MRI with some surrounding edema.  No new deficit.  Seems improving now. -Repeat CT head without any new changes.  Parkinson disease (HCC)/Dementia/ Dysphagia. -Was n.p.o. with severe dysphagia and initially, eventually started on a dysphagia 1 diet -Continue Sinemet and primidone  Normocytic anemia -Stable  Hypertension.  Blood pressure within goal. -Labetalol as needed  Hyperlipidemia -Continue simvastatin  COPD (chronic obstructive pulmonary disease) (HCC) -Stable -Continue Incruse Ellipta   Objective: Vitals:   08/04/20 0729 08/04/20 0800 08/04/20 1133 08/04/20 1533  BP:  (!) 114/56  127/64  Pulse:    80  Resp:  18  16  Temp:  98.6 F (37 C) 98.2 F (36.8 C) 97.6 F (36.4 C)  TempSrc:  Oral  Axillary  SpO2: 97%   98%  Weight:      Height:        Intake/Output Summary (Last 24 hours) at 08/04/2020 1552 Last data filed at 08/04/2020 0836 Gross per 24 hour  Intake 60 ml  Output 175 ml  Net -115 ml   Filed Weights   07/25/20 1925  Weight: 72.6 kg    Examination:  General.  Chronically ill-appearing, frail elderly man, in no acute distress. Pulmonary.  Lungs clear bilaterally, normal respiratory effort. CV.  Regular rate and rhythm, no JVD, rub or murmur. Abdomen.  Soft, nontender, nondistended, BS positive. CNS.  Alert and oriented .  No focal neurologic deficit. Extremities.  No  edema, no cyanosis, pulses intact and symmetrical.  DVT prophylaxis: Lovenox Code Status: DNR Family Communication: Son was updated on phone. Disposition Plan:  Status is: Inpatient  Remains inpatient appropriate because:Inpatient level of care appropriate due to severity of illness   Dispo: The patient is from: Home              Anticipated  d/c is to: SNF              Anticipated d/c date is: 1 day              Patient currently is medically stable to d/c.  Pending insurance authorization.  Consultants:   Neurology  Procedures:  Antimicrobials:   Data Reviewed: I have personally reviewed following labs and imaging studies  CBC: Recent Labs  Lab 07/30/20 0319 07/31/20 0344 08/01/20 0151 08/04/20 0605  WBC 5.7 7.4 6.8 4.3  HGB 11.2* 12.7* 12.8* 14.2  HCT 33.8* 38.1* 36.8* 41.0  MCV 88.0 87.8 86.6 86.5  PLT 213 236 267 [id number]   Basic Metabolic Panel: Recent Labs  Lab 07/30/20 0319 07/31/20 0344 08/01/20 0151 08/04/20 0605  NA 133* 135 135 133*  K 3.4* 3.5 3.3* 3.9  CL 98 96* 94* 96*  CO2 26 27 29 25   GLUCOSE 101* 98 99 80  BUN 7* 7* 8 22  CREATININE 0.56* 0.62 0.70 0.81  CALCIUM 8.7* 9.1 9.4 8.9   GFR: Estimated Creatinine Clearance: 62.2 mL/min (by C-G formula based on SCr of 0.81 mg/dL). Liver Function Tests: No results for input(s): AST, ALT, ALKPHOS, BILITOT, PROT, ALBUMIN in the last 168 hours. No results for input(s): LIPASE, AMYLASE in the last 168 hours. No results for input(s): AMMONIA in the last 168 hours. Coagulation Profile: No results for input(s): INR, PROTIME in the last 168 hours. Cardiac Enzymes: No results for input(s): CKTOTAL, CKMB, CKMBINDEX, TROPONINI in the last 168 hours. BNP (last 3 results) No results for input(s): PROBNP in the last 8760 hours. HbA1C: No results for input(s): HGBA1C in the last 72 hours. CBG: Recent Labs  Lab 07/30/20 1137  GLUCAP 139*   Lipid Profile: No results for input(s): CHOL, HDL, LDLCALC, TRIG, CHOLHDL, LDLDIRECT in the last 72 hours. Thyroid Function Tests: No results for input(s): TSH, T4TOTAL, FREET4, T3FREE, THYROIDAB in the last 72 hours. Anemia Panel: No results for input(s): VITAMINB12, FOLATE, FERRITIN, TIBC, IRON, RETICCTPCT in the last 72 hours. Sepsis Labs: No results for input(s): PROCALCITON, LATICACIDVEN in the last 168  hours.  Recent Results (from the past 240 hour(s))  Resp Panel by RT-PCR (Flu A&B, Covid) Nasopharyngeal Swab     Status: None   Collection Time: 07/25/20 11:10 PM   Specimen: Nasopharyngeal Swab; Nasopharyngeal(NP) swabs in vial transport medium  Result Value Ref Range Status   SARS Coronavirus 2 by RT PCR NEGATIVE NEGATIVE Final    Comment: (NOTE) SARS-CoV-2 target nucleic acids are NOT DETECTED.  The SARS-CoV-2 RNA is generally detectable in upper respiratory specimens during the acute phase of infection. The lowest concentration of SARS-CoV-2 viral copies this assay can detect is 138 copies/mL. A negative result does not preclude SARS-Cov-2 infection and should not be used as the sole basis for treatment or other patient management decisions. A negative result may occur with  improper specimen collection/handling, submission of specimen other than nasopharyngeal swab, presence of viral mutation(s) within the areas targeted by this assay, and inadequate number of viral copies(<138 copies/mL). A negative result must be combined with clinical observations, patient history,  and epidemiological information. The expected result is Negative.  Fact Sheet for Patients:  EntrepreneurPulse.com.au  Fact Sheet for Healthcare Providers:  IncredibleEmployment.be  This test is no t yet approved or cleared by the [place] FDA and  has been authorized for detection and/or diagnosis of SARS-CoV-2 by FDA under an Emergency Use Authorization (EUA). This EUA will remain  in effect (meaning this test can be used) for the duration of the COVID-19 declaration under Section 564(b)(1) of the Act, 21 U.S.C.section 360bbb-3(b)(1), unless the authorization is terminated  or revoked sooner.       Influenza A by PCR NEGATIVE NEGATIVE Final   Influenza B by PCR NEGATIVE NEGATIVE Final    Comment: (NOTE) The Xpert Xpress SARS-CoV-2/FLU/RSV plus assay is intended  as an aid in the diagnosis of influenza from Nasopharyngeal swab specimens and should not be used as a sole basis for treatment. Nasal washings and aspirates are unacceptable for Xpert Xpress SARS-CoV-2/FLU/RSV testing.  Fact Sheet for Patients: EntrepreneurPulse.com.au  Fact Sheet for Healthcare Providers: IncredibleEmployment.be  This test is not yet approved or cleared by the [place] FDA and has been authorized for detection and/or diagnosis of SARS-CoV-2 by FDA under an Emergency Use Authorization (EUA). This EUA will remain in effect (meaning this test can be used) for the duration of the COVID-19 declaration under Section 564(b)(1) of the Act, 21 U.S.C. section 360bbb-3(b)(1), unless the authorization is terminated or revoked.  Performed at [facility], [address], [place], [place] [address]   Resp Panel by RT-PCR (Flu A&B, Covid) Nasopharyngeal Swab     Status: None   Collection Time: 08/04/20  2:07 PM   Specimen: Nasopharyngeal Swab; Nasopharyngeal(NP) swabs in vial transport medium  Result Value Ref Range Status   SARS Coronavirus 2 by RT PCR NEGATIVE NEGATIVE Final    Comment: (NOTE) SARS-CoV-2 target nucleic acids are NOT DETECTED.  The SARS-CoV-2 RNA is generally detectable in upper respiratory specimens during the acute phase of infection. The lowest concentration of SARS-CoV-2 viral copies this assay can detect is 138 copies/mL. A negative result does not preclude SARS-Cov-2 infection and should not be used as the sole basis for treatment or other patient management decisions. A negative result may occur with  improper specimen collection/handling, submission of specimen other than nasopharyngeal swab, presence of viral mutation(s) within the areas targeted by this assay, and inadequate number of viral copies(<138 copies/mL). A negative result must be combined with clinical observations, patient history, and  epidemiological information. The expected result is Negative.  Fact Sheet for Patients:  EntrepreneurPulse.com.au  Fact Sheet for Healthcare Providers:  IncredibleEmployment.be  This test is no t yet approved or cleared by the [place] FDA and  has been authorized for detection and/or diagnosis of SARS-CoV-2 by FDA under an Emergency Use Authorization (EUA). This EUA will remain  in effect (meaning this test can be used) for the duration of the COVID-19 declaration under Section 564(b)(1) of the Act, 21 U.S.C.section 360bbb-3(b)(1), unless the authorization is terminated  or revoked sooner.       Influenza A by PCR NEGATIVE NEGATIVE Final   Influenza B by PCR NEGATIVE NEGATIVE Final    Comment: (NOTE) The Xpert Xpress SARS-CoV-2/FLU/RSV plus assay is intended as an aid in the diagnosis of influenza from Nasopharyngeal swab specimens and should not be used as a sole basis for treatment. Nasal washings and aspirates are unacceptable for Xpert Xpress SARS-CoV-2/FLU/RSV testing.  Fact Sheet for Patients: EntrepreneurPulse.com.au  Fact Sheet for Healthcare  Providers: IncredibleEmployment.be  This test is not yet approved or cleared by the [place] and has been authorized for detection and/or diagnosis of SARS-CoV-2 by FDA under an Emergency Use Authorization (EUA). This EUA will remain in effect (meaning this test can be used) for the duration of the COVID-19 declaration under Section 564(b)(1) of the Act, 21 U.S.C. section 360bbb-3(b)(1), unless the authorization is terminated or revoked.  Performed at [facility], [place] [address] [place], [place] [address]      Radiology Studies: CT HEAD WO CONTRAST  Result Date: 08/03/2020 CLINICAL DATA:  Chronic headache with new features or increased frequency EXAM: CT HEAD WITHOUT CONTRAST TECHNIQUE: Contiguous axial images were obtained from  the base of the skull through the vertex without intravenous contrast. COMPARISON:  CTA 07/31/2020 and previous FINDINGS: Brain: Interval evolution of subacute right MCA distribution infarct without hemorrhage. No mass effect. No midline shift. Stable mild atrophy. Ventricles and sulci elsewhere remain symmetric. Patchy hypoattenuation in deep and periventricular white matter bilaterally as before. Vascular: Atherosclerotic and physiologic intracranial calcifications. Skull: Normal. Negative for fracture or focal lesion. Sinuses/Orbits: No acute finding. Other: None. IMPRESSION: 1. Interval evolution of subacute right MCA distribution infarct without hemorrhage or mass effect. 2. Stable atrophy and nonspecific white matter changes. Electronically Signed   By: Lucrezia Europe M.D.   On: 08/03/2020 16:24    Scheduled Meds: . aspirin EC  325 mg Oral Daily  . atorvastatin  40 mg Oral Daily  . carbidopa-levodopa  3 tablet Oral BID  . clopidogrel  75 mg Oral Daily  . enoxaparin (LOVENOX) injection  40 mg Subcutaneous Q24H  . pantoprazole  40 mg Oral Daily  . polyethylene glycol  17 g Oral Daily  . primidone  150 mg Oral BID  . umeclidinium bromide  1 puff Inhalation Daily  . venlafaxine  37.5 mg Oral BID  . vitamin B-12  1,000 mcg Oral Daily   Continuous Infusions:   LOS: 8 days   Time spent: 30 minutes  Lorella Nimrod, MD [facility]  If 7PM-7AM, please contact night-coverage [email or web address]  08/04/2020, 3:52 PM   This record has been created using Systems analyst. Errors have been sought and corrected,but may not always be located. Such creation errors do not reflect on the standard of care.

## 2020-08-04 NOTE — TOC Progression Note (Signed)
Transition of Care [facility]) - Progression Note    Patient Details  Name: KYUSS HALE MRN: [id number] Date of Birth: 1927/12/28  Transition of Care [facility]) CM/SW Contact  Eduard Roux, [place] Phone Number: 08/04/2020, 10:21 AM  Clinical Narrative:     [facility] has accepted and confirmed bed offer. CSW updated patient's son [name].  CSW called [facility] to start insurance authorization.   CSW will continue to follow and assist with discharge planning.    Antony Blackbird, MSW, LCSW Clinical Social Worker    Expected Discharge Plan: Skilled Nursing Facility Barriers to Discharge: Continued Medical Work up  Expected Discharge Plan and Services Expected Discharge Plan: Skilled Nursing Facility In-house Referral: Clinical Social Work   Post Acute Care Choice: Skilled Nursing Facility Living arrangements for the past 2 months: Single Family Home                                       Social Determinants of Health (SDOH) Interventions    Readmission Risk Interventions No flowsheet data found.

## 2020-08-04 NOTE — Progress Notes (Signed)
PT Cancellation Note  Patient Details Name: YANNI QUIROA MRN: [id number] DOB: 01/14/1928   Cancelled Treatment:    Reason Eval/Treat Not Completed: Medical issues which prohibited therapy (pt with continued HA and extension of CVA as well as flu)  Will check back on pt tomorrow.   Lyanne Co, PT  Acute Rehab Services  Pager [phone] Office [phone]    Lawana Chambers Nolton Denis 08/04/2020, 2:22 PM

## 2020-08-04 NOTE — Progress Notes (Signed)
OT Cancellation Note  Patient Details Name: Jeffrey Blair MRN: [id number] DOB: [birth year]   Cancelled Treatment:    Reason Eval/Treat Not Completed: Medical issues which prohibited therapy;Other (comment) pt c/o HA and general malaise, per RN pt now with flu, will check back as pt medically appropriate.   Pollyann Glen K., COTA/L Acute Rehabilitation Services [phone]   Barron Schmid 08/04/2020, 4:07 PM

## 2020-08-05 ENCOUNTER — Inpatient Hospital Stay (HOSPITAL_COMMUNITY): Payer: PPO

## 2020-08-05 DIAGNOSIS — I10 Essential (primary) hypertension: Secondary | ICD-10-CM | POA: Diagnosis not present

## 2020-08-05 LAB — URINALYSIS, ROUTINE W REFLEX MICROSCOPIC
Bilirubin Urine: NEGATIVE
Glucose, UA: NEGATIVE mg/dL
Hgb urine dipstick: NEGATIVE
Ketones, ur: NEGATIVE mg/dL
Nitrite: NEGATIVE
Protein, ur: NEGATIVE mg/dL
Specific Gravity, Urine: 1.025 (ref 1.005–1.030)
pH: 5.5 (ref 5.0–8.0)

## 2020-08-05 LAB — URINALYSIS, MICROSCOPIC (REFLEX)

## 2020-08-05 LAB — GLUCOSE, CAPILLARY: Glucose-Capillary: 108 mg/dL — ABNORMAL HIGH (ref 70–99)

## 2020-08-05 MED ORDER — SODIUM CHLORIDE 0.9 % IV BOLUS
500.0000 mL | Freq: Once | INTRAVENOUS | Status: AC
  Administered 2020-08-05: 500 mL via INTRAVENOUS

## 2020-08-05 MED ORDER — SODIUM CHLORIDE 0.9 % IV BOLUS
500.0000 mL | Freq: Once | INTRAVENOUS | Status: AC
  Administered 2020-08-06: 500 mL via INTRAVENOUS

## 2020-08-05 MED ORDER — TRAMADOL HCL 50 MG PO TABS
50.0000 mg | ORAL_TABLET | Freq: Four times a day (QID) | ORAL | Status: DC | PRN
  Administered 2020-08-05 – 2020-08-08 (×3): 50 mg via ORAL
  Filled 2020-08-05 (×3): qty 1

## 2020-08-05 NOTE — Plan of Care (Signed)
  Problem: Education: Goal: Knowledge of disease or condition will improve Outcome: Progressing Goal: Knowledge of secondary prevention will improve Outcome: Progressing Goal: Knowledge of patient specific risk factors addressed and post discharge goals established will improve Outcome: Progressing Goal: Individualized Educational Video(s) Outcome: Progressing   Problem: Coping: Goal: Will verbalize positive feelings about self Outcome: Progressing   Problem: Health Behavior/Discharge Planning: Goal: Ability to manage health-related needs will improve Outcome: Progressing   Problem: Self-Care: Goal: Ability to participate in self-care as condition permits will improve Outcome: Progressing Goal: Verbalization of feelings and concerns over difficulty with self-care will improve Outcome: Progressing Goal: Ability to communicate needs accurately will improve Outcome: Progressing   Problem: Nutrition: Goal: Risk of aspiration will decrease Outcome: Progressing Goal: Dietary intake will improve Outcome: Progressing   Problem: Ischemic Stroke/TIA Tissue Perfusion: Goal: Complications of ischemic stroke/TIA will be minimized Outcome: Progressing   Problem: Education: Goal: Knowledge of General Education information will improve Description: Including pain rating scale, medication(s)/side effects and non-pharmacologic comfort measures Outcome: Progressing   Problem: Health Behavior/Discharge Planning: Goal: Ability to manage health-related needs will improve Outcome: Progressing   Problem: Clinical Measurements: Goal: Ability to maintain clinical measurements within normal limits will improve Outcome: Progressing Goal: Will remain free from infection Outcome: Progressing Goal: Diagnostic test results will improve Outcome: Progressing Goal: Respiratory complications will improve Outcome: Progressing Goal: Cardiovascular complication will be avoided Outcome: Progressing    Problem: Activity: Goal: Risk for activity intolerance will decrease Outcome: Progressing   Problem: Nutrition: Goal: Adequate nutrition will be maintained Outcome: Progressing   Problem: Coping: Goal: Level of anxiety will decrease Outcome: Progressing   Problem: Elimination: Goal: Will not experience complications related to bowel motility Outcome: Progressing Goal: Will not experience complications related to urinary retention Outcome: Progressing   Problem: Pain Managment: Goal: General experience of comfort will improve Outcome: Progressing   Problem: Safety: Goal: Ability to remain free from injury will improve Outcome: Progressing   Problem: Skin Integrity: Goal: Risk for impaired skin integrity will decrease Outcome: Progressing   Problem: Education: Goal: Knowledge of disease or condition will improve Outcome: Progressing   Problem: Intracerebral Hemorrhage Tissue Perfusion: Goal: Complications of Intracerebral Hemorrhage will be minimized Outcome: Progressing   Problem: Ischemic Stroke/TIA Tissue Perfusion: Goal: Complications of ischemic stroke/TIA will be minimized Outcome: Progressing   Problem: Spontaneous Subarachnoid Hemorrhage Tissue Perfusion: Goal: Complications of Spontaneous Subarachnoid Hemorrhage will be minimized Outcome: Progressing   

## 2020-08-05 NOTE — Progress Notes (Signed)
OT Cancellation Note  Patient Details Name: Jeffrey Blair MRN: [id number] DOB: 1927/12/13   Cancelled Treatment:    Reason Eval/Treat Not Completed: Medical issues which prohibited therapy;Other (comment) RN with request to hold this date 2/2 patient with persistent headache and nausea. OT will continue efforts when patient is able to participate.   Kallie Edward OTR/L Supplemental OT, Department of rehab services [phone]  Aeralyn Barna R H. 08/05/2020, 1:57 PM

## 2020-08-05 NOTE — Progress Notes (Signed)
Physical Therapy Treatment Patient Details Name: Jeffrey Blair MRN: [id number] DOB: 1927/10/06 Today's Date: 08/05/2020    History of Present Illness 87 yo male admitted to [facility] from [place] for AMS. CTA demonstrates R proximal MCA posterior division occlusion with distal reconstitution, severe stenosis of proximal basilar and P2 of R PCA. MRI Head 07/27/20 demonstrates acute to subacute infarct in the posterior right MCA territory  infarct, and especially at the right MCA/PCA watershed. Pt and family left AMA, returned. PMH includes parkinson's disease, CVA, prostate cancer, TIA, migraines, cervical disc disease, dementia.    PT Comments    Pt limited again by pain. Cognition is impaired and pt demonstrates some difficulty following commands versus an unwillingness to follow commands this session. Pt becomes tachycardic with mobility and is found to have a temp of 103 at the end of session. Pt is generally weak, PT and family attempting to encourage eating during session however pt will not swallow food, only water. Pt will continue to benefit from PT POC to improve mobility tolerance and to reduce caregiver burden. Family goals still remain to rehab and then return home once pt's mobility has improved some. Pt was able to ambulate independently prior to admission and demonstrates the strength to improve transfer quality to reduce caregiver burden. PT feels SNF placement remains the most appropriate setting despite insurance denial. If family elect to take pt home the pt will benefit from HHPT, a hospital bed, manual wheelchair, and mechanical lift.   Follow Up Recommendations  SNF;Supervision/Assistance - 24 hour;Supervision for mobility/OOB     Equipment Recommendations  None recommended by PT    Recommendations for Other Services       Precautions / Restrictions Precautions Precautions: Fall Restrictions Weight Bearing Restrictions: No    Mobility  Bed  Mobility Overal bed mobility: Needs Assistance Bed Mobility: Supine to Sit;Sit to Supine     Supine to sit: Max assist Sit to supine: Total assist      Transfers                    Ambulation/Gait                 Stairs             Wheelchair Mobility    Modified Rankin (Stroke Patients Only) Modified Rankin (Stroke Patients Only) Pre-Morbid Rankin Score: Moderate disability Modified Rankin: Severe disability     Balance Overall balance assessment: Needs assistance Sitting-balance support: Bilateral upper extremity supported Sitting balance-Leahy Scale: Poor Sitting balance - Comments: min-modA                                    Cognition Arousal/Alertness: Awake/alert Behavior During Therapy: Restless Overall Cognitive Status: History of cognitive impairments - at baseline                                 General Comments: baseline dementia, per family pt was able to hold conversation and follow commands at baseline. Pt follows >50% of commands today, requiring physical assistance to initiate most mobility. Unable to discern orientation due to slurred/garbled speech      Exercises      General Comments General comments (skin integrity, edema, etc.): pt tachy up to 150 with mobility, upon which PT assists pt back into supine. Pt warm to  touch, buried under blankets upon PT arrival, nurse tech records pt at 103 after PT completes mobility      Pertinent Vitals/Pain Pain Assessment: Faces Faces Pain Scale: Hurts whole lot Pain Location: head Pain Descriptors / Indicators: Headache Pain Intervention(s): Monitored during session    Home Living                      Prior Function            PT Goals (current goals can now be found in the care plan section) Acute Rehab PT Goals Patient Stated Goal: to improve Progress towards PT goals: Not progressing toward goals - comment (limited by pain, also  likely by fever)    Frequency    Min 3X/week      PT Plan Current plan remains appropriate    Co-evaluation              AM-PAC PT "6 Clicks" Mobility   Outcome Measure  Help needed turning from your back to your side while in a flat bed without using bedrails?: A Lot Help needed moving from lying on your back to sitting on the side of a flat bed without using bedrails?: A Lot Help needed moving to and from a bed to a chair (including a wheelchair)?: Total Help needed standing up from a chair using your arms (e.g., wheelchair or bedside chair)?: Total Help needed to walk in hospital room?: Total Help needed climbing 3-5 steps with a railing? : Total 6 Click Score: 8    End of Session   Activity Tolerance: Patient limited by pain Patient left: in bed;with call bell/phone within reach;with bed alarm set Nurse Communication: Mobility status;Need for lift equipment PT Visit Diagnosis: Unsteadiness on feet (R26.81);Other abnormalities of gait and mobility (R26.89);Muscle weakness (generalized) (M62.81);Difficulty in walking, not elsewhere classified (R26.2);Other symptoms and signs involving the nervous system (R29.898)     Time: 0350-0938 PT Time Calculation (min) (ACUTE ONLY): 28 min  Charges:  $Therapeutic Activity: 8-22 mins                     Arlyss Gandy, PT, DPT Acute Rehabilitation Pager: [phone]    Arlyss Gandy 08/05/2020, 5:45 PM

## 2020-08-05 NOTE — Significant Event (Signed)
Rapid Response Event Note   Reason for Call :  SBP-70s, pt pale/cool/clammy  Pt had been given 500cc NS bolus earlier in night for tachycardia.   Asked RN to give 250cc NS.  Initial Focused Assessment:  Pt laying in bed with eyes closed. He will open eyes to repeated verbal command. His is dysarthric, has a L facial droop, L hemianopia, R sided gaze, and his L side is flaccid. NIH-21. Pt with prior R sided stroke with symptoms as above except his L side is usually weak, not flaccid.   T-97.6, HR-110, BP-79/45, RR-22, SpO2-93% on RA.   Additional 500cc NS bolus given-SBP up to 90s but quickly dropped back down to 70s. 500cc more ordered.     Interventions:  CBG-108 1L NS bolus given(to make total of 1.5L NS tonight) CT head STAT EKG-NSR(pt's HR down to 90s at the time EKG done) NPO until new stroke swallow screen done and passed Plan of Care:  I think hypotension is making stroke symptoms worse. Give additional 500cc NS bolus(to make total of 1.5L NS) and monitor response. Call RRT if further assistance needed.    Event Summary:   MD Notified: Dr. Carren Rang notified by bedside RN, Dr. Fidel Levy notified by myself and came to bedside to assess pt. Call CZYS:[id number] Arrival ZSWF:[id number] End Time:0050  Terrilyn Saver, RN

## 2020-08-05 NOTE — Progress Notes (Signed)
PROGRESS NOTE    Jeffrey Blair  [id number] DOB: 02/18/1928 DOA: 07/25/2020 PCP: [facility]     Brief Narrative:  Jeffrey Blair is a 87 yo male with PMH Parkinson's disease, dementia, debility, hx cerebellar CVA,cervical DDD who was brought to the hospital for worsening balance/mobility, worsening confusion, and dysarthria. He was initially taken to [facility] ER, but patient got agitated with a very long wait time, signed out AMA by family, then [facility] MRI showed an acute widespread infarct involving approximately a 7 cm area of the right MCA/PCA territory,in addition to chronic infarcts. He failed a swallow eval performed on 07/27/2020 with speech therapy, later started on dysphagia 1 diet.   New events last 24 hours / Subjective: Patient is a poor historian, alert and oriented x 1 on my exam.   Assessment & Plan:   Active Problems:   COPD (chronic obstructive pulmonary disease) (HCC)   Parkinson disease (HCC)   Hyperlipidemia   Hypertension   Normocytic anemia   Acute CVA (cerebrovascular accident) ([place])   Acute ischemic stroke ([place])   Acute right MCA CVA -Appreciate neurology, recommended for aspirin 325 mg daily, Plavix 75 mg daily for 3 months followed by aspirin daily -Recommended for 30-day event monitor as an outpatient but unclear if patient would be able to comply/quality of recording -Continue Lipitor -PT OT recommended SNF, however insurance auth denied  Persistent headache -Repeat CT head showed interval evolution of subacute right MCA infarct without hemorrhage or mass affect -Symptom management  History of Parkinson's disease, dementia -Continue Sinemet, primidone  Hypertension -Long-term blood pressure goal 130-150 given severe intracranial stenosis  Depression/anxiety -Continue Effexor   DVT prophylaxis:  enoxaparin (LOVENOX) injection 40 mg Start:  07/31/20 1800 SCDs Start: 07/26/20 0449  Code Status: DNR Family Communication: None at bedside Disposition Plan:  Status is: Inpatient  Remains inpatient appropriate because:Unsafe d/c plan   Dispo: The patient is from: Home              Anticipated d/c is to: Home              Anticipated d/c date is: 1 day              Patient currently is medically stable to d/c. Insurance auth denied SNF placement. Healthcare advantage requesting palliative care medicine consultation. Will need safe dispo for discharge.     Antimicrobials:  Anti-infectives (From admission, onward)   None        Objective: Vitals:   08/05/20 0757 08/05/20 0800 08/05/20 0818 08/05/20 1200  BP:   (!) 142/69 (!) 148/69  Pulse:   72 88  Resp: 19 20  18   Temp:   98.2 F (36.8 C) (!) 97.5 F (36.4 C)  TempSrc:   Axillary Oral  SpO2: 96% 96%  98%  Weight:      Height:        Intake/Output Summary (Last 24 hours) at 08/05/2020 1514 Last data filed at 08/05/2020 1400 Gross per 24 hour  Intake 220 ml  Output --  Net 220 ml   Filed Weights   07/25/20 1925  Weight: 72.6 kg    Examination:  General exam: Appears calm and comfortable  Respiratory system: Clear to auscultation. Respiratory effort normal. No respiratory distress.  Cardiovascular system: S1 & S2 heard, RRR. No murmurs. No pedal edema. Gastrointestinal system: Abdomen is nondistended, soft and nontender. Normal bowel sounds heard. Central  nervous system: Alert and oriented x 1  Extremities: Symmetric in appearance  Skin: No rashes, lesions or ulcers on exposed skin   Data Reviewed: I have personally reviewed following labs and imaging studies  CBC: Recent Labs  Lab 07/30/20 0319 07/31/20 0344 08/01/20 0151 08/04/20 0605  WBC 5.7 7.4 6.8 4.3  HGB 11.2* 12.7* 12.8* 14.2  HCT 33.8* 38.1* 36.8* 41.0  MCV 88.0 87.8 86.6 86.5  PLT 213 236 267 [id number]   Basic Metabolic Panel: Recent Labs  Lab 07/30/20 0319 07/31/20 0344  08/01/20 0151 08/04/20 0605  NA 133* 135 135 133*  K 3.4* 3.5 3.3* 3.9  CL 98 96* 94* 96*  CO2 26 27 29 25   GLUCOSE 101* 98 99 80  BUN 7* 7* 8 22  CREATININE 0.56* 0.62 0.70 0.81  CALCIUM 8.7* 9.1 9.4 8.9   GFR: Estimated Creatinine Clearance: 62.2 mL/min (by C-G formula based on SCr of 0.81 mg/dL). Liver Function Tests: No results for input(s): AST, ALT, ALKPHOS, BILITOT, PROT, ALBUMIN in the last 168 hours. No results for input(s): LIPASE, AMYLASE in the last 168 hours. No results for input(s): AMMONIA in the last 168 hours. Coagulation Profile: No results for input(s): INR, PROTIME in the last 168 hours. Cardiac Enzymes: No results for input(s): CKTOTAL, CKMB, CKMBINDEX, TROPONINI in the last 168 hours. BNP (last 3 results) No results for input(s): PROBNP in the last 8760 hours. HbA1C: No results for input(s): HGBA1C in the last 72 hours. CBG: Recent Labs  Lab 07/30/20 1137  GLUCAP 139*   Lipid Profile: No results for input(s): CHOL, HDL, LDLCALC, TRIG, CHOLHDL, LDLDIRECT in the last 72 hours. Thyroid Function Tests: No results for input(s): TSH, T4TOTAL, FREET4, T3FREE, THYROIDAB in the last 72 hours. Anemia Panel: No results for input(s): VITAMINB12, FOLATE, FERRITIN, TIBC, IRON, RETICCTPCT in the last 72 hours. Sepsis Labs: No results for input(s): PROCALCITON, LATICACIDVEN in the last 168 hours.  Recent Results (from the past 240 hour(s))  Resp Panel by RT-PCR (Flu A&B, Covid) Nasopharyngeal Swab     Status: None   Collection Time: 08/04/20  2:07 PM   Specimen: Nasopharyngeal Swab; Nasopharyngeal(NP) swabs in vial transport medium  Result Value Ref Range Status   SARS Coronavirus 2 by RT PCR NEGATIVE NEGATIVE Final    Comment: (NOTE) SARS-CoV-2 target nucleic acids are NOT DETECTED.  The SARS-CoV-2 RNA is generally detectable in upper respiratory specimens during the acute phase of infection. The lowest concentration of SARS-CoV-2 viral copies this assay can  detect is 138 copies/mL. A negative result does not preclude SARS-Cov-2 infection and should not be used as the sole basis for treatment or other patient management decisions. A negative result may occur with  improper specimen collection/handling, submission of specimen other than nasopharyngeal swab, presence of viral mutation(s) within the areas targeted by this assay, and inadequate number of viral copies(<138 copies/mL). A negative result must be combined with clinical observations, patient history, and epidemiological information. The expected result is Negative.  Fact Sheet for Patients:  EntrepreneurPulse.com.au  Fact Sheet for Healthcare Providers:  IncredibleEmployment.be  This test is no t yet approved or cleared by the [place] FDA and  has been authorized for detection and/or diagnosis of SARS-CoV-2 by FDA under an Emergency Use Authorization (EUA). This EUA will remain  in effect (meaning this test can be used) for the duration of the COVID-19 declaration under Section 564(b)(1) of the Act, 21 U.S.C.section 360bbb-3(b)(1), unless the authorization is terminated  or revoked sooner.  Influenza A by PCR NEGATIVE NEGATIVE Final   Influenza B by PCR NEGATIVE NEGATIVE Final    Comment: (NOTE) The Xpert Xpress SARS-CoV-2/FLU/RSV plus assay is intended as an aid in the diagnosis of influenza from Nasopharyngeal swab specimens and should not be used as a sole basis for treatment. Nasal washings and aspirates are unacceptable for Xpert Xpress SARS-CoV-2/FLU/RSV testing.  Fact Sheet for Patients: BloggerCourse.com  Fact Sheet for Healthcare Providers: SeriousBroker.it  This test is not yet approved or cleared by the [place] FDA and has been authorized for detection and/or diagnosis of SARS-CoV-2 by FDA under an Emergency Use Authorization (EUA). This EUA will remain in  effect (meaning this test can be used) for the duration of the COVID-19 declaration under Section 564(b)(1) of the Act, 21 U.S.C. section 360bbb-3(b)(1), unless the authorization is terminated or revoked.  Performed at [facility], [address], [place], [place] [address]       Radiology Studies: CT HEAD WO CONTRAST  Result Date: 08/03/2020 CLINICAL DATA:  Chronic headache with new features or increased frequency EXAM: CT HEAD WITHOUT CONTRAST TECHNIQUE: Contiguous axial images were obtained from the base of the skull through the vertex without intravenous contrast. COMPARISON:  CTA 07/31/2020 and previous FINDINGS: Brain: Interval evolution of subacute right MCA distribution infarct without hemorrhage. No mass effect. No midline shift. Stable mild atrophy. Ventricles and sulci elsewhere remain symmetric. Patchy hypoattenuation in deep and periventricular white matter bilaterally as before. Vascular: Atherosclerotic and physiologic intracranial calcifications. Skull: Normal. Negative for fracture or focal lesion. Sinuses/Orbits: No acute finding. Other: None. IMPRESSION: 1. Interval evolution of subacute right MCA distribution infarct without hemorrhage or mass effect. 2. Stable atrophy and nonspecific white matter changes. Electronically Signed   By: Corlis Leak M.D.   On: 08/03/2020 16:24      Scheduled Meds: . aspirin EC  325 mg Oral Daily  . atorvastatin  40 mg Oral Daily  . carbidopa-levodopa  3 tablet Oral BID  . clopidogrel  75 mg Oral Daily  . enoxaparin (LOVENOX) injection  40 mg Subcutaneous Q24H  . pantoprazole  40 mg Oral Daily  . polyethylene glycol  17 g Oral Daily  . primidone  150 mg Oral BID  . umeclidinium bromide  1 puff Inhalation Daily  . venlafaxine  37.5 mg Oral BID  . vitamin B-12  1,000 mcg Oral Daily   Continuous Infusions:   LOS: 9 days      Time spent: 25 minutes   Noralee Stain, DO [facility] 08/05/2020, 3:14 PM   Available via  Epic secure chat 7am-7pm After these hours, please refer to coverage provider listed on [email or web address]

## 2020-08-06 ENCOUNTER — Inpatient Hospital Stay (HOSPITAL_COMMUNITY): Payer: PPO

## 2020-08-06 DIAGNOSIS — Z7189 Other specified counseling: Secondary | ICD-10-CM

## 2020-08-06 DIAGNOSIS — G20 Parkinson's disease: Secondary | ICD-10-CM | POA: Diagnosis not present

## 2020-08-06 DIAGNOSIS — I10 Essential (primary) hypertension: Secondary | ICD-10-CM | POA: Diagnosis not present

## 2020-08-06 DIAGNOSIS — I639 Cerebral infarction, unspecified: Secondary | ICD-10-CM | POA: Diagnosis not present

## 2020-08-06 DIAGNOSIS — J441 Chronic obstructive pulmonary disease with (acute) exacerbation: Secondary | ICD-10-CM | POA: Diagnosis not present

## 2020-08-06 LAB — URINALYSIS, ROUTINE W REFLEX MICROSCOPIC
Glucose, UA: NEGATIVE mg/dL
Ketones, ur: 5 mg/dL — AB
Leukocytes,Ua: NEGATIVE
Nitrite: NEGATIVE
Protein, ur: 100 mg/dL — AB
Specific Gravity, Urine: 1.02 (ref 1.005–1.030)
WBC, UA: >50 WBC/hpf — ABNORMAL HIGH (ref 0–5)
pH: 5 (ref 5.0–8.0)

## 2020-08-06 LAB — BLOOD CULTURE ID PANEL (REFLEXED) - BCID2

## 2020-08-06 LAB — CBC
HCT: 35.4 % — ABNORMAL LOW (ref 39.0–52.0)
Hemoglobin: 11.7 g/dL — ABNORMAL LOW (ref 13.0–17.0)
MCH: 30.2 pg (ref 26.0–34.0)
MCHC: 33.1 g/dL (ref 30.0–36.0)
MCV: 91.5 fL (ref 80.0–100.0)
Platelets: 137 10*3/uL — ABNORMAL LOW (ref 150–400)
RBC: 3.87 MIL/uL — ABNORMAL LOW (ref 4.22–5.81)
RDW: 13.7 % (ref 11.5–15.5)
WBC: 22.4 10*3/uL — ABNORMAL HIGH (ref 4.0–10.5)
nRBC: 0 % (ref 0.0–0.2)

## 2020-08-06 LAB — BASIC METABOLIC PANEL
Anion gap: 15 (ref 5–15)
BUN: 30 mg/dL — ABNORMAL HIGH (ref 8–23)
CO2: 19 mmol/L — ABNORMAL LOW (ref 22–32)
Calcium: 8.1 mg/dL — ABNORMAL LOW (ref 8.9–10.3)
Chloride: 102 mmol/L (ref 98–111)
Creatinine, Ser: 1.91 mg/dL — ABNORMAL HIGH (ref 0.61–1.24)
GFR, Estimated: 33 mL/min — ABNORMAL LOW (ref >60)
Glucose, Bld: 76 mg/dL (ref 70–99)
Potassium: 4 mmol/L (ref 3.5–5.1)
Sodium: 136 mmol/L (ref 135–145)

## 2020-08-06 LAB — LACTIC ACID, PLASMA
Lactic Acid, Venous: 4.8 mmol/L (ref 0.5–1.9)
Lactic Acid, Venous: 5.1 mmol/L (ref 0.5–1.9)

## 2020-08-06 MED ORDER — LORAZEPAM 2 MG/ML IJ SOLN
1.0000 mg | Freq: Once | INTRAMUSCULAR | Status: DC
  Filled 2020-08-06 (×2): qty 1

## 2020-08-06 MED ORDER — SODIUM CHLORIDE 0.9 % IV BOLUS
500.0000 mL | Freq: Once | INTRAVENOUS | Status: AC
  Administered 2020-08-06: 500 mL via INTRAVENOUS

## 2020-08-06 MED ORDER — ENOXAPARIN SODIUM 30 MG/0.3ML ~~LOC~~ SOLN
30.0000 mg | SUBCUTANEOUS | Status: DC
  Administered 2020-08-06 – 2020-08-07 (×2): 30 mg via SUBCUTANEOUS
  Filled 2020-08-06 (×2): qty 0.3

## 2020-08-06 MED ORDER — MIDODRINE HCL 5 MG PO TABS
10.0000 mg | ORAL_TABLET | Freq: Three times a day (TID) | ORAL | Status: DC
  Administered 2020-08-06 – 2020-08-09 (×7): 10 mg via ORAL
  Filled 2020-08-06 (×10): qty 2

## 2020-08-06 MED ORDER — MIDODRINE HCL 5 MG PO TABS
5.0000 mg | ORAL_TABLET | Freq: Three times a day (TID) | ORAL | Status: DC
  Administered 2020-08-06: 5 mg via ORAL
  Filled 2020-08-06: qty 1

## 2020-08-06 MED ORDER — SODIUM CHLORIDE 0.9 % IV SOLN
2.0000 g | INTRAVENOUS | Status: DC
  Administered 2020-08-06 – 2020-08-07 (×2): 2 g via INTRAVENOUS
  Filled 2020-08-06: qty 2
  Filled 2020-08-06: qty 20
  Filled 2020-08-06: qty 2

## 2020-08-06 MED ORDER — SODIUM CHLORIDE 0.9 % IV BOLUS
250.0000 mL | Freq: Once | INTRAVENOUS | Status: AC
  Administered 2020-08-06: 250 mL via INTRAVENOUS

## 2020-08-06 MED ORDER — SODIUM CHLORIDE 0.9 % IV SOLN: INTRAVENOUS | Status: DC

## 2020-08-06 NOTE — Plan of Care (Signed)
See notes for events.   Problem: Education: Goal: Knowledge of disease or condition will improve Outcome: Progressing Goal: Knowledge of secondary prevention will improve Outcome: Progressing Goal: Knowledge of patient specific risk factors addressed and post discharge goals established will improve Outcome: Progressing Goal: Individualized Educational Video(s) Outcome: Progressing   Problem: Coping: Goal: Will verbalize positive feelings about self Outcome: Progressing   Problem: Health Behavior/Discharge Planning: Goal: Ability to manage health-related needs will improve Outcome: Progressing   Problem: Self-Care: Goal: Ability to participate in self-care as condition permits will improve Outcome: Progressing Goal: Verbalization of feelings and concerns over difficulty with self-care will improve Outcome: Progressing Goal: Ability to communicate needs accurately will improve Outcome: Progressing   Problem: Nutrition: Goal: Risk of aspiration will decrease Outcome: Progressing Goal: Dietary intake will improve Outcome: Progressing   Problem: Ischemic Stroke/TIA Tissue Perfusion: Goal: Complications of ischemic stroke/TIA will be minimized Outcome: Progressing   Problem: Education: Goal: Knowledge of General Education information will improve Description: Including pain rating scale, medication(s)/side effects and non-pharmacologic comfort measures Outcome: Progressing   Problem: Health Behavior/Discharge Planning: Goal: Ability to manage health-related needs will improve Outcome: Progressing   Problem: Clinical Measurements: Goal: Ability to maintain clinical measurements within normal limits will improve Outcome: Progressing Goal: Will remain free from infection Outcome: Progressing Goal: Diagnostic test results will improve Outcome: Progressing Goal: Respiratory complications will improve Outcome: Progressing Goal: Cardiovascular complication will be  avoided Outcome: Progressing   Problem: Activity: Goal: Risk for activity intolerance will decrease Outcome: Progressing   Problem: Nutrition: Goal: Adequate nutrition will be maintained Outcome: Progressing   Problem: Coping: Goal: Level of anxiety will decrease Outcome: Progressing   Problem: Elimination: Goal: Will not experience complications related to bowel motility Outcome: Progressing Goal: Will not experience complications related to urinary retention Outcome: Progressing   Problem: Pain Managment: Goal: General experience of comfort will improve Outcome: Progressing   Problem: Safety: Goal: Ability to remain free from injury will improve Outcome: Progressing   Problem: Skin Integrity: Goal: Risk for impaired skin integrity will decrease Outcome: Progressing   Problem: Education: Goal: Knowledge of disease or condition will improve Outcome: Progressing   Problem: Intracerebral Hemorrhage Tissue Perfusion: Goal: Complications of Intracerebral Hemorrhage will be minimized Outcome: Progressing   Problem: Ischemic Stroke/TIA Tissue Perfusion: Goal: Complications of ischemic stroke/TIA will be minimized Outcome: Progressing   Problem: Spontaneous Subarachnoid Hemorrhage Tissue Perfusion: Goal: Complications of Spontaneous Subarachnoid Hemorrhage will be minimized Outcome: Progressing

## 2020-08-06 NOTE — Consult Note (Addendum)
   NAME:  Jeffrey Blair, MRN:  [id number], DOB:  [birth year], LOS: 10 ADMISSION DATE:  07/25/2020, CONSULTATION DATE:  08/06/2020 REFERRING MD:  Dr Dorthula Perfect, CHIEF COMPLAINT:  hypotension   Brief History:  87 year old gentleman who is DNR with a rapid response called for hypotension Had worsening stroke symptoms  He has an underlying history of Parkinson's disease, dementia, debility, cerebellar CVA  Brought into the hospital initially for difficulty with his balance  MRI showed widespread infarct MCA/PCA territory  Able to take medications  Past Medical History:   Past Medical History:  Diagnosis Date  . Cancer (HCC)    prostate-in remission  . Cerebrovascular disease 01/25/2016  . Cervical disc disease 01/25/2016  . Dementia (HCC)   . Migraine   . Parkinson disease (HCC)   . Stroke (HCC)   . TIA (transient ischemic attack) 01/24/2016    Significant Hospital Events:  Was actually been scheduled for discharge  Consults:  pccm neurology  Procedures:  None  Significant Diagnostic Tests:  CT head IMPRESSION: 1. Interval evolution of subacute right MCA distribution infarct without hemorrhage or mass effect. 2. Stable atrophy and nonspecific white matter changes.  MRI IMPRESSION: 1. Acute to subacute infarct in the posterior right MCA territory infarct, and especially at the right MCA/PCA watershed (fetal type PCA origins, see #2). Cytotoxic edema with possible petechial hemorrhage but no malignant hemorrhagic transformation or mass effect.  2. Chronically poor distal vertebral artery flow with evidence of reconstituted basilar, grossly stable since 2017.  3. Mild progression of underlying chronic small vessel disease since 2017.   Micro Data:  Blood culture 08/05/2020>>  Antimicrobials:    Interim History / Subjective:  Elderly gentleman Not coherent  Objective   Blood pressure (!) 83/46, pulse (!) 104, temperature 97.8 F (36.6 C), temperature  source Oral, resp. rate 17, height 6\' 1"  (1.854 m), weight 72.6 kg, SpO2 98 %.        Intake/Output Summary (Last 24 hours) at 08/06/2020 0547 Last data filed at 08/05/2020 1400 Gross per 24 hour  Intake 220 ml  Output -  Net 220 ml   Filed Weights   07/25/20 1925  Weight: 72.6 kg    Examination: General: Elderly, does not appear to be in distress HENT: Moist oral mucosa Lungs: Clear breath sounds Cardiovascular: S1-S2 appreciated Abdomen: Soft, bowel sounds appreciated Extremities: No clubbing, no edema Neuro: Awake, left side appears to be weak, moving his right side GU:   Last blood work was 08/04/2020-relatively normal  Resolved Hospital Problem list     Assessment & Plan:  Acute right MCA CVA Severe intracranial stenosis noted on MRI -Continue current treatment -Appreciated neurology input -Goal is to avoid hypotension -Patient already responding to fluid resuscitation -Add midodrine 3 times daily-started on 5 mg 3 times daily may be adjusted as needed -Will give another bolus of 250 cc of saline  Patient with headaches -Symptom management  History of Parkinson's disease/dementia -On Sinemet and primidone  Hypertension  History of obstructive lung disease  History of depression anxiety  Since patient is able to take in orally Oral midodrine should be tried initially The addition of IV pressors may be considered if not improving Appears to be responding to fluid resuscitation  10/02/2020, MD [place] [facility] Pager: [phone]

## 2020-08-06 NOTE — Progress Notes (Signed)
PHARMACY - PHYSICIAN COMMUNICATION CRITICAL VALUE ALERT - BLOOD CULTURE IDENTIFICATION (BCID)  Jeffrey Blair is an 87 y.o. male who presented to [facility] on 07/25/2020 with a chief complaint of CVA with new hypotension/sepsis overnight. Patient's blood cultures now positive for E. Coli, no resistance detected, pending sensitivities.   Assessment: Bcx + E. Coli (no resistance) in 1/2 bottles  Name of physician (or Provider) Contacted: Dr. Marijo Conception  Current antibiotics: none  Changes to prescribed antibiotics recommended:  Initiate ceftriaxone 2g once daily   Results for orders placed or performed during the hospital encounter of 07/25/20  Blood Culture ID Panel (Reflexed) (Collected: 08/05/2020  9:31 PM)  Result Value Ref Range   Enterococcus faecalis NOT DETECTED NOT DETECTED   Enterococcus Faecium NOT DETECTED NOT DETECTED   Listeria monocytogenes NOT DETECTED NOT DETECTED   Staphylococcus species NOT DETECTED NOT DETECTED   Staphylococcus aureus (BCID) NOT DETECTED NOT DETECTED   Staphylococcus epidermidis NOT DETECTED NOT DETECTED   Staphylococcus lugdunensis NOT DETECTED NOT DETECTED   Streptococcus species NOT DETECTED NOT DETECTED   Streptococcus agalactiae NOT DETECTED NOT DETECTED   Streptococcus pneumoniae NOT DETECTED NOT DETECTED   Streptococcus pyogenes NOT DETECTED NOT DETECTED   A.calcoaceticus-baumannii NOT DETECTED NOT DETECTED   Bacteroides fragilis NOT DETECTED NOT DETECTED   Enterobacterales DETECTED (A) NOT DETECTED   Enterobacter cloacae complex NOT DETECTED NOT DETECTED   Escherichia coli DETECTED (A) NOT DETECTED   Klebsiella aerogenes NOT DETECTED NOT DETECTED   Klebsiella oxytoca NOT DETECTED NOT DETECTED   Klebsiella pneumoniae NOT DETECTED NOT DETECTED   Proteus species NOT DETECTED NOT DETECTED   Salmonella species NOT DETECTED NOT DETECTED   Serratia marcescens NOT DETECTED NOT DETECTED   Haemophilus influenzae NOT DETECTED NOT DETECTED    Neisseria meningitidis NOT DETECTED NOT DETECTED   Pseudomonas aeruginosa NOT DETECTED NOT DETECTED   Stenotrophomonas maltophilia NOT DETECTED NOT DETECTED   Candida albicans NOT DETECTED NOT DETECTED   Candida auris NOT DETECTED NOT DETECTED   Candida glabrata NOT DETECTED NOT DETECTED   Candida krusei NOT DETECTED NOT DETECTED   Candida parapsilosis NOT DETECTED NOT DETECTED   Candida tropicalis NOT DETECTED NOT DETECTED   Cryptococcus neoformans/gattii NOT DETECTED NOT DETECTED   CTX-M ESBL NOT DETECTED NOT DETECTED   Carbapenem resistance IMP NOT DETECTED NOT DETECTED   Carbapenem resistance KPC NOT DETECTED NOT DETECTED   Carbapenem resistance NDM NOT DETECTED NOT DETECTED   Carbapenem resist OXA 48 LIKE NOT DETECTED NOT DETECTED   Carbapenem resistance VIM NOT DETECTED NOT DETECTED    Jennette Kettle 08/06/2020  10:45 AM

## 2020-08-06 NOTE — Progress Notes (Signed)
MD paged for Hypotension event and also at shift change for elevated HR in the 130's.   Rapid Response RN called (see note).  Pt was hypotension, clammy, and pale. See 2317 VS. Pt was also flaccid on the left side which was new from his baseline. BG was 108. Stat CT was done. Bolus was given see MAR. EKG was done.

## 2020-08-06 NOTE — Progress Notes (Signed)
SLP Cancellation Note  Patient Details Name: Jeffrey Blair MRN: [id number] DOB: [birth year]   Cancelled treatment:       Reason Eval/Treat Not Completed: Patient declined, no reason specified; family kindly refused tx d/t pt's current condition.   Tressie Stalker, M.S., CCC-SLP 08/06/2020, 2:01 PM

## 2020-08-06 NOTE — Progress Notes (Signed)
CM received a phone call from pts insurance that the medical director is requesting a palliative care consult. MD updated.  TOC following.

## 2020-08-06 NOTE — Progress Notes (Signed)
Ok to reduce lovenox to 30mg  SQ qday due to his CrCl<30 per Dr. .  Alvino Chapel, PharmD, BCIDP, AAHIVP, CPP Infectious Disease Pharmacist 08/06/2020 2:39 PM

## 2020-08-06 NOTE — Progress Notes (Signed)
Lactic acid came back at 4.8, MD Alvino Chapel made aware via secure chat

## 2020-08-06 NOTE — Consult Note (Signed)
NAME:  Jeffrey Blair, MRN:  [id number], DOB:  09/10/1927, LOS: 64 ADMISSION DATE:  07/25/2020, CONSULTATION DATE:  08/06/20 REFERRING MD:  Dr. Maylene Roes, CHIEF COMPLAINT: hypotension  Brief History:  [name] is 87yo male with Parkinson's, dementia, COPD, previous cerebellar CVA admitted to [facility] 12/26 for acute CVA, who developed hypotension overnight.  History of Present Illness:  [name] is 87yo male with Parkinson's, dementia, COPD, previous cerebellar CVA who presented to [facility] on 12/25 for altered mental status, slurred speech. Patient originally went to [facility] on 12/25 where he was found to have acute CVA. While waiting for bed placement, patient became agitated with the emergency department and signed out AMA. Patient's family subsequently went to [facility] for further evaluation.  Overnight patient became hypotensive. BCx, UCx drawn yesterday. R/p CT head showed areas of hypodensity consistent with watershed areas. PCCM consulted for continued hypotension despite IVF resuscitaiton.  Past Medical History:   Past Medical History:  Diagnosis Date  . Cancer ([place])    prostate-in remission  . Cerebrovascular disease 01/25/2016  . Cervical disc disease 01/25/2016  . Dementia ([place])   . Migraine   . Parkinson disease ([place])   . Stroke ([place])   . TIA (transient ischemic attack) 01/24/2016   Significant Hospital Events:  Scheduled for discharge 1/6 prior to O/N events  Consults:  Neurology 12/25 PCCM 1/6  Procedures:  None  Significant Diagnostic Tests:  MR brain 12/27: Acute to subacute infarct in the posterior right MCA territory infarct, and especially at the right MCA/PCA watershed (fetal type PCA origins, see #2). Cytotoxic edema with possible petechial hemorrhage but no malignant hemorrhagic transformation or mass effect. Chronically poor distal vertebral artery flow with evidence of reconstituted basilar, grossly stable since 2017. Mild progression of underlying chronic  small vessel disease since 2017.  CTA 12/30: Severe multifocal stenosis of both vertebral arteries with multiple areas of occlusion, likely due to atherosclerotic disease. No carotid stenosis. Positive for recent Large Vessel Occlusion: Right MCA posterior M2 branch. Superimposed severe widespread posterior circulation stenosis, including severe tandem stenoses of the Right PCA which in conjunction with #1 likely explain the vascular territory involvement of the recent acute infarct (right MCA/PCA). Positive also for Occluded Distal Vertebral Arteries as demonstrated on the neck yesterday. There is suboptimal reconstitution of the Basilar Artery from bilateral posterior communicating arteries. Other intracranial atherosclerosis: Severe stenosis Left MCA posterior M3 branch. Moderate stenosis Right ACA A2. Mild stenosis Right MCA origin.  [place] 1/6: Suspected new subtle hypodensities adjacent to the evolving subacute right MCA distribution infarct as above, concerning for new acute component/interval expansion. No associated hemorrhage or mass effect. Otherwise normal expected interval evolution of the subacute right MCA distribution infarct. Few serpiginous hyperdensities within the area of infarction could reflect developing laminar necrosis and/or petechial hemorrhage. No evidence for malignant hemorrhagic transformation or significant regional mass effect. No other new acute intracranial abnormality.  Micro Data:  BCx 1/5 > E. Coli UCx 1/5 > pending COVID-19 12/25, 1/4 > neg  Antimicrobials:  none  Interim History / Subjective:  Overnight patient became hypotensive. Given total of 2.25L IVF with minimal response. PCCM consulted, started midodrine. Patient's son, Jeffrey Blair, at bedside. Stated mental status unchanged over last few days, no history of hypotension as far as he is aware.  Objective   Blood pressure (!) 82/56, pulse (!) 118, temperature (!) 97.4 F (36.3 C), temperature source Axillary,  resp. rate 20, height 6\' 1"  (1.854 m), weight 72.6 kg, SpO2 99 %.  Intake/Output Summary (Last 24 hours) at 08/06/2020 1024 Last data filed at 08/06/2020 0600 Gross per 24 hour  Intake 100 ml  Output 175 ml  Net -75 ml   Filed Weights   07/25/20 1925  Weight: 72.6 kg    Examination: General: Chronically ill-appearing, no acute distress HENT: Normocephalic, atraumatic Lungs: Clear to auscultation bilaterally. No wheezing, rhonchi, rales Cardiovascular: Regular rate, rhythm. No murmurs, rubs, gallops. 2+ DP Abdomen: Soft, non-tender, non-distended Extremities: Warm, dry. No pitting edema bilaterally Neuro: Awake, alert. Responds to questions appropriately.  Resolved Hospital Problem list     Assessment & Plan:  [name] is 87yo male with Parkinson's, dementia, previous CVA, chronic migraines admitted 12/25 with acute CVA, found to be hypotensive overnight.  #Acute R MCA CVA New hypodensities overnight on CT head, appears to be watershed areas. Most likely 2/2 hypotension. Important to avoid further hypotension in setting of acute CVA.  - C/w current treatment per primary, neurology - Hold all antihypertensives  #E. Coli bacteremia BCx drawn yesterday growing E. Coli. Most likely cause of patient's hypotension overnight. 2.25L IVF given, MAP 66 on exam this AM. Discussed with Dr. Alvino Chapel, will start IV antibiotics and continue with fluid resuscitation as needed. Can consider vasopressors vs GOC if continues to be hypotensive with antibiotics, fluids. - Ceftriaxone 2g q24h - Increase midodrine 10mg  TID - IVF as needed for MAP>65  #COPD Stable, respiratory status appears unchanged. Continue with regimen per primary team. - C/w Incruse, albuterol  #Hx Parkinson's, dementia - Continue with home medications Sinemt, primidone  #Depression -C/w home Effexor per primary team  Best practice (evaluated daily)  Diet: DYS1 Pain/Anxiety/Delirium protocol (if indicated): per  primary VAP protocol (if indicated): n/a DVT prophylaxis: Lovenox GI prophylaxis: pantoprazole qd Glucose control: n/a Mobility: BR Disposition:Progressive  Goals of Care:  Last date of multidisciplinary goals of care discussion: per primary Family and staff present: per primary Summary of discussion: per primary Follow up goals of care discussion due: n/a Code Status: DNR  Labs   CBC: Recent Labs  Lab 07/31/20 0344 08/01/20 0151 08/04/20 0605  WBC 7.4 6.8 4.3  HGB 12.7* 12.8* 14.2  HCT 38.1* 36.8* 41.0  MCV 87.8 86.6 86.5  PLT 236 267 189    Basic Metabolic Panel: Recent Labs  Lab 07/31/20 0344 08/01/20 0151 08/04/20 0605  NA 135 135 133*  K 3.5 3.3* 3.9  CL 96* 94* 96*  CO2 27 29 25   GLUCOSE 98 99 80  BUN 7* 8 22  CREATININE 0.62 0.70 0.81  CALCIUM 9.1 9.4 8.9   GFR: Estimated Creatinine Clearance: 62.2 mL/min (by C-G formula based on SCr of 0.81 mg/dL). Recent Labs  Lab 07/31/20 0344 08/01/20 0151 08/04/20 0605  WBC 7.4 6.8 4.3    Liver Function Tests: No results for input(s): AST, ALT, ALKPHOS, BILITOT, PROT, ALBUMIN in the last 168 hours. No results for input(s): LIPASE, AMYLASE in the last 168 hours. No results for input(s): AMMONIA in the last 168 hours.  ABG    Component Value Date/Time   TCO2 28 01/24/2016 1528     Coagulation Profile: No results for input(s): INR, PROTIME in the last 168 hours.  Cardiac Enzymes: No results for input(s): CKTOTAL, CKMB, CKMBINDEX, TROPONINI in the last 168 hours.  HbA1C: Hgb A1c MFr Bld  Date/Time Value Ref Range Status  07/27/2020 10:13 AM 5.6 4.8 - 5.6 % Final    Comment:    (NOTE) Pre diabetes:  5.7%-6.4%  Diabetes:              >6.4%  Glycemic control for   <7.0% adults with diabetes   01/25/2016 05:53 AM 5.9 (H) 4.8 - 5.6 % Final    Comment:    (NOTE)         Pre-diabetes: 5.7 - 6.4         Diabetes: >6.4         Glycemic control for adults with diabetes: <7.0      CBG: Recent Labs  Lab 07/30/20 1137 08/05/20 2338  GLUCAP 139* 108*    Review of Systems:   n/a  Past Medical History:  He,  has a past medical history of Cancer ([place]), Cerebrovascular disease (01/25/2016), Cervical disc disease (01/25/2016), Dementia ([place]), Migraine, Parkinson disease ([place]), Stroke ([place]), and TIA (transient ischemic attack) (01/24/2016).   Surgical History:  History reviewed. No pertinent surgical history.   Social History:   reports that he quit smoking about 35 years ago. His smoking use included cigarettes. He has a 10.00 pack-year smoking history. He has never used smokeless tobacco. He reports that he does not drink alcohol and does not use drugs.   Family History:  His family history includes Allergies in his child; Cancer in his brother, father, and sister.   Allergies Allergies  Allergen Reactions  . Morphine Anaphylaxis, Shortness Of Breath and Other (See Comments)    Altered mental status; takes tramadol at home  . Penicillins Other (See Comments)    Has patient had a PCN reaction causing immediate rash, facial/tongue/throat swelling, SOB or lightheadedness with hypotension: unknown Has patient had a PCN reaction causing severe rash involving mucus membranes or skin necrosis: unknown Has patient had a PCN reaction that required hospitalization; unknown Has patient had a PCN reaction occurring within the last 10 years:unknown If all of the above answers are "NO", then may proceed with Cephalosporin use.  Unknown reaction     Home Medications  Prior to Admission medications   Medication Sig Start Date End Date Taking? Authorizing Provider  acetaminophen (TYLENOL) 500 MG tablet Take 500 mg by mouth every 6 (six) hours as needed.   Yes [provider]  aspirin 81 MG chewable tablet Chew 1 tablet (81 mg total) by mouth daily. 01/25/16  Yes Rexene Alberts, MD  carbidopa-levodopa (SINEMET IR) 25-100 MG per tablet Take 3 tablets by mouth in  the morning and at bedtime.   Yes [provider]  diltiazem (DILACOR XR) 120 MG 24 hr capsule Take 120 mg by mouth daily.   Yes [provider]  loratadine (CLARITIN) 10 MG tablet Take 10 mg by mouth daily as needed for allergies.   Yes [provider]  meclizine (ANTIVERT) 25 MG tablet Take 25 mg by mouth 3 (three) times daily as needed for dizziness.   Yes [provider]  omeprazole (PRILOSEC) 20 MG capsule Take 20 mg by mouth daily.   Yes [provider]  primidone (MYSOLINE) 50 MG tablet Take 150 mg by mouth 2 (two) times daily.   Yes [provider]  propranolol (INDERAL) 20 MG tablet Take 10 mg by mouth in the morning and at bedtime.   Yes [provider]  simvastatin (ZOCOR) 20 MG tablet Take 1 tablet (20 mg total) by mouth every evening. 01/25/16  Yes Rexene Alberts, MD  tiotropium (SPIRIVA) 18 MCG inhalation capsule Place 18 mcg into inhaler and inhale daily.   Yes [provider]  Tiotropium  Bromide-Olodaterol (STIOLTO RESPIMAT) 2.5-2.5 MCG/ACT AERS Inhale 2 puffs into the lungs daily.   Yes [provider]  traMADol (ULTRAM) 50 MG tablet Take 50 mg by mouth 3 (three) times daily as needed for moderate pain.    Yes [provider]  venlafaxine (EFFEXOR) 37.5 MG tablet Take 37.5 mg by mouth 2 (two) times daily.   Yes [provider]  vitamin B-12 (CYANOCOBALAMIN) 500 MCG tablet Take 1,000 mcg by mouth daily.    Yes [provider]  albuterol (PROVENTIL HFA;VENTOLIN HFA) 108 (90 Base) MCG/ACT inhaler Inhale 2 puffs into the lungs every 6 (six) hours as needed for wheezing or shortness of breath. 01/11/16   Juanito Doom, MD  albuterol (PROVENTIL) (2.5 MG/3ML) 0.083% nebulizer solution Take 3 mLs (2.5 mg total) by nebulization every 6 (six) hours as needed for wheezing or shortness of breath. Patient not taking: No sig reported 01/11/16   Juanito Doom, MD  Calcium  Carbonate-Vitamin D 600-400 MG-UNIT tablet Take 1 tablet by mouth 2 (two) times daily.    [provider]  carboxymethylcellulose (REFRESH PLUS) 0.5 % SOLN Place 1 drop into both eyes 4 (four) times daily as needed (dry eyes).     [provider]     Critical care time: 35 minutes    Sanjuan Dame, MD Internal Medicine PGY-1 Pgr: [phone]

## 2020-08-06 NOTE — Consult Note (Signed)
Consultation Note Date: 08/06/2020   Patient Name: Jeffrey Blair  DOB: 01-May-1928  MRN: [id number]  Age / Sex: 87 y.o., male   PCP: [facility] Referring Physician: Dessa Phi, DO   REASON FOR CONSULTATION:Establishing goals of care  Palliative Care consult requested for goals of care discussion in this 87 y.o. male with multiple medical problems including prostate cancer in remission, history of other nonhemorrhagic CVA, TIA, Parkinson's disease, migraine headaches, cervical disc disease, and dementia. Patient presented to [facility] after presenting to the ED at [facility] with concerns of AMS and found to have an acute nonhemorrhagic CVA. Per notation family reports patient was in a normal state of health on 07/24/20. On 1/5 patient had episode of tachycardia, hypotension, with severe left flaccid hemiparesis. Head CT showed new subtle hypodensities adjacent to the evolving subacute right MCA.   Clinical Assessment and Goals of Care: I have reviewed medical records including lab results, imaging, Epic notes, and MAR, received report from the bedside RN, and assessed the patient. I spoke with patient's son, Jeffrey Blair (son/HCPOA) via phone  to discuss diagnosis prognosis, [facility], EOL wishes, disposition and options.  I introduced Palliative Medicine as specialized medical care for people living with serious illness. It focuses on providing relief from the symptoms and stress of a serious illness. The goal is to improve quality of life for both the patient and the family. Son verbalized understanding and appreciation.   We discussed a brief life review of the patient, along with his functional and nutritional status. Jeffrey Blair reports patient lived at home with family. He is reitred from [employer]. Served in the [place] [employer], community). He has 9 children (4 sons/5 daughters).   Jeffrey Blair report prior to Christmas day patient was living in the home with 2 of his daughters.  He required some assistance with his ADLs. His gait was slightly unsteady and would utilize a cane at times in addition to stand-by assist.   We discussed His current illness and what it means in the larger context of His on-going co-morbidities. With specific discussions regarding his recent CVA and overall functional decline. Natural disease trajectory and expectations at EOL were discussed.  Son verbalized understanding of updates regarding his father. Jeffrey Blair shares all medical providers are providing the same updates and he is appreciative everyone is on the same page confirming their feelings and thoughts. Neurology updated patient family this afternoon at length also. Jeffrey Blair shares although they understand their father's poor prognosis and minimal to no chance of recovery family are spiritual and would like some time before they make any final decisions.   I attempted to elicit values and goals of care important to the patient.    The difference between aggressive medical intervention and comfort care was considered in light of the patient's goals of care. I educated Jeffrey Blair on what comfort care measures would look like as he requested.   Jeffrey Blair confirms at this time (sharing he is speaking on behalf of his family/siblings) they would like to continue to watchfully wait and allow his father some time. He expressed his and family's sadness and attempts to cope at such a "major, sudden, and devastating" event for their father and themselves. Emotional support provided. Jeffrey Blair shares family do understand their prognosis and not naive at what "things" look like, however they would like to allow some time to confirm their feelings and how their father will do. He states the next 48-72 hours will be  the defining factor and allow the family to make final decisions and be "ok with it!"   He reports family does not want Mr. Tienken to suffer and shares his sadness watching him call out or say he is in pain.  He reports he wants his pain controlled but also does not want him sedated in case they have moments they can have an appropriate discussion (with chances it may be the last).   Advanced directives, concepts specific to code status, artifical feeding and hydration, and rehospitalization were considered and discussed. Patient does have a documented advanced directive. Son confirms wishes for DNR/DNI. Does not feel artificial feeding would be considered unless patient has any chance of survival or recovery.   I created space and opportunity to establish a rapport with patient's son. We gently discussed  hospice support. Jeffrey Blair confirmed understanding of their goals and philosophy and expressed family would be more open to this discussion if no improvement over the next 48-72 hours, but prefer not to unless a significant decline.   Questions and concerns were addressed. The family was encouraged to call with questions or concerns.  PMT will continue to support holistically.   SOCIAL HISTORY:     reports that he quit smoking about 35 years ago. His smoking use included cigarettes. He has a 10.00 pack-year smoking history. He has never used smokeless tobacco. He reports that he does not drink alcohol and does not use drugs.  CODE STATUS: DNR  ADVANCE DIRECTIVES: Primary Decision Maker: Jeffrey Blair (son/HCPOA)    SYMPTOM MANAGEMENT: per attending   Palliative Prophylaxis:   Aspiration, Bowel Regimen, Delirium Protocol, Eye Care, Frequent Pain Assessment, Oral Care and Turn Reposition  PSYCHO-SOCIAL/SPIRITUAL:  Support System: Family  Desire for further Chaplaincy support:{ NO  Additional Recommendations (Limitations, Scope, Preferences):  treat the treatable, watchful waiting, DNR   Education on hospice/palliative    PAST MEDICAL HISTORY: Past Medical History:  Diagnosis Date  . Cancer ([place])    prostate-in remission  . Cerebrovascular disease 01/25/2016  . Cervical disc disease  01/25/2016  . Dementia ([place])   . Migraine   . Parkinson disease ([place])   . Stroke ([place])   . TIA (transient ischemic attack) 01/24/2016    ALLERGIES:  is allergic to morphine and penicillins.   MEDICATIONS:  Current Facility-Administered Medications  Medication Dose Route Frequency Provider Last Rate Last Admin  . 0.9 %  sodium chloride infusion   Intravenous Continuous Dessa Phi, DO 100 mL/hr at 08/06/20 1023 New Bag at 08/06/20 1023  . acetaminophen (TYLENOL) tablet 650 mg  650 mg Oral Q6H PRN Reubin Milan, MD   650 mg at 08/06/20 0617   Or  . acetaminophen (TYLENOL) suppository 650 mg  650 mg Rectal Q6H PRN Reubin Milan, MD   650 mg at 08/04/20 1200  . albuterol (VENTOLIN HFA) 108 (90 Base) MCG/ACT inhaler 2 puff  2 puff Inhalation Q6H PRN Reubin Milan, MD      . aspirin EC tablet 325 mg  325 mg Oral Daily Rosalin Hawking, MD   325 mg at 08/06/20 1039  . atorvastatin (LIPITOR) tablet 40 mg  40 mg Oral Daily Rosalin Hawking, MD   40 mg at 08/06/20 1039  . carbidopa-levodopa (SINEMET IR) 25-100 MG per tablet immediate release 3 tablet  3 tablet Oral BID Reubin Milan, MD   3 tablet at 08/06/20 1036  . cefTRIAXone (ROCEPHIN) 2 g in sodium chloride 0.9 % 100 mL IVPB  2  g Intravenous Q24H Sanjuan Dame, MD 200 mL/hr at 08/06/20 1219 2 g at 08/06/20 1219  . clopidogrel (PLAVIX) tablet 75 mg  75 mg Oral Daily Reubin Milan, MD   75 mg at 08/06/20 1039  . enoxaparin (LOVENOX) injection 30 mg  30 mg Subcutaneous Q24H Pham, Minh Q, RPH-CPP      . haloperidol lactate (HALDOL) injection 1 mg  1 mg Intravenous Q6H PRN Domenic Polite, MD   1 mg at 08/01/20 1847  . ketorolac (TORADOL) 15 MG/ML injection 15 mg  15 mg Intravenous Q8H PRN Opyd, Ilene Qua, MD   15 mg at 08/06/20 1332  . labetalol (NORMODYNE) injection 10 mg  10 mg Intravenous Q2H PRN Reubin Milan, MD      . loratadine (CLARITIN) tablet 10 mg  10 mg Oral Daily PRN Reubin Milan, MD   10 mg at  08/03/20 1536  . meclizine (ANTIVERT) tablet 25 mg  25 mg Oral TID PRN Reubin Milan, MD   25 mg at 08/04/20 0845  . midodrine (PROAMATINE) tablet 10 mg  10 mg Oral TID WC Sanjuan Dame, MD   10 mg at 08/06/20 1034  . ondansetron (ZOFRAN) tablet 4 mg  4 mg Oral Q6H PRN Reubin Milan, MD   4 mg at 08/04/20 0845   Or  . ondansetron [facility]) injection 4 mg  4 mg Intravenous Q6H PRN Reubin Milan, MD   4 mg at 08/06/20 [id number]  . pantoprazole (PROTONIX) EC tablet 40 mg  40 mg Oral Daily Reubin Milan, MD   40 mg at 08/06/20 1040  . polyethylene glycol (MIRALAX / GLYCOLAX) packet 17 g  17 g Oral Daily Lorella Nimrod, MD   17 g at 08/06/20 1040  . polyvinyl alcohol (LIQUIFILM TEARS) 1.4 % ophthalmic solution 1 drop  1 drop Both Eyes QID PRN Reubin Milan, MD      . primidone (MYSOLINE) tablet 150 mg  150 mg Oral BID Reubin Milan, MD   150 mg at 08/06/20 1045  . traMADol (ULTRAM) tablet 50 mg  50 mg Oral Q6H PRN Dessa Phi, DO   50 mg at 08/05/20 1738  . umeclidinium bromide (INCRUSE ELLIPTA) 62.5 MCG/INH 1 puff  1 puff Inhalation Daily Reubin Milan, MD   1 puff at 08/05/20 0757  . venlafaxine (EFFEXOR) tablet 37.5 mg  37.5 mg Oral BID Reubin Milan, MD   37.5 mg at 08/06/20 1045  . vitamin B-12 (CYANOCOBALAMIN) tablet 1,000 mcg  1,000 mcg Oral Daily Reubin Milan, MD   1,000 mcg at 08/06/20 1045    VITAL SIGNS: BP (!) 85/50   Pulse 99   Temp 98.8 F (37.1 C) (Axillary)   Resp 20   Ht 6\' 1"  (1.854 m)   Wt 72.6 kg   SpO2 95%   BMI 21.11 kg/m  Filed Weights   07/25/20 1925  Weight: 72.6 kg    Estimated body mass index is 21.11 kg/m as calculated from the following:   Height as of this encounter: 6\' 1"  (1.854 m).   Weight as of this encounter: 72.6 kg.  LABS: CBC:    Component Value Date/Time   WBC 22.4 (H) 08/06/2020 1143   HGB 11.7 (L) 08/06/2020 1143   HCT 35.4 (L) 08/06/2020 1143   PLT 137 (L) 08/06/2020 1143    Comprehensive Metabolic Panel:    Component Value Date/Time   NA 136 08/06/2020 1143   K 4.0 08/06/2020 1143  BUN 30 (H) 08/06/2020 1143   CREATININE 1.91 (H) 08/06/2020 1143   ALBUMIN 4.2 07/26/2020 0021     Review of Systems  Unable to perform ROS: Acuity of condition   Prognosis: Extremely guarded-Poor   Discharge Planning:  To Be Determined  Recommendations: . DNR/DNI-as confirmed by son, Jeffrey Blair [facility])  . Continue with current plan of care per medical team.  . Extensive discussion with son, states family understands poor prognosis, however family would like to allow 48-72 hrs to determine how patient will do and allow time for family to make the "best and right decisions" for patient and "be comfortable" with their decisions.  Marland Kitchen PMT will continue to support and follow. Please call team line with urgent needs.   Palliative Performance Scale: PPS 20%               Son expressed understanding and was in agreement with this plan.   Thank you for allowing the Palliative Medicine Team to assist in the care of this patient.  The above conversation was completed via telephone due during the COVID-19 pandemic. Thorough chart review and discussion with necessary members of the care team was completed as part of assessment. All issues were discussed and addressed but no physical exam was performed.  Time In: 1530 Time Out: 1635 Time Total: 65 min.   Visit consisted of counseling and education dealing with the complex and emotionally intense issues of symptom management and palliative care in the setting of serious and potentially life-threatening illness.Greater than 50%  of this time was spent counseling and coordinating care related to the above assessment and plan.  Signed by:  Willette Alma, AGPCNP-BC Palliative Medicine Team  Phone: [phone] Pager: [phone] Amion: Thea Alken

## 2020-08-06 NOTE — Plan of Care (Signed)
  Problem: Education: Goal: Knowledge of disease or condition will improve Outcome: Not Progressing Goal: Knowledge of secondary prevention will improve Outcome: Not Progressing   Problem: Health Behavior/Discharge Planning: Goal: Ability to manage health-related needs will improve Outcome: Not Progressing   Problem: Self-Care: Goal: Ability to communicate needs accurately will improve Outcome: Not Progressing   Problem: Nutrition: Goal: Risk of aspiration will decrease Outcome: Not Progressing   Problem: Education: Goal: Knowledge of disease or condition will improve Outcome: Not Progressing

## 2020-08-06 NOTE — Progress Notes (Signed)
Subjective: Rapid response was called on the patient due to hypotension.  On her arrival, he had significant hypotension with systolics in the 60s.  He has undergone  Exam: Vitals:   08/05/20 2346 08/05/20 2347  BP: (!) 80/40 (!) 90/50  Pulse: 100 (!) 101  Resp: 17 (!) 22  Temp:  97.6 F (36.4 C)  SpO2: 94% 93%   Gen: In bed, NAD Resp: non-labored breathing, no acute distress Abd: soft, nt  Neuro: MS: Awake, alert, follows commands.  I suspect some neglect. CN: Left hemianopia, does cross midline in both directions Motor: He has a severe left flaccid hemiparesis, but he does withdraw his left arm and leg slightly. Sensory: He does grimace to noxious stimulation on the left, but clearly decreased compared to the right  CT head reviewed-Slight worsening of his ischemia  Impression: 87 year old male with worsening of the stroke symptoms in the setting of severe hypotension with multiple intracranial stenoses.  At this point, I think avoidance of hypotension would be the mainstay of treatment, and his blood pressure is already starting to respond to fluid resuscitation.  Hopefully he does not already infarcted, but I do not think he is a candidate for either TPA or mechanical thrombectomy at this time.  Recommendations: 1) avoid hypotension 2) continue PT/OT 3) swallowing evaluation will need to be revisited 4) stroke team to follow  Ritta Slot, MD [facility] [phone]  If 7pm- 7am, please page neurology on call as listed in AMION.

## 2020-08-06 NOTE — Progress Notes (Addendum)
PROGRESS NOTE    Jeffrey Blair  [id number] DOB: [birth year] DOA: 07/25/2020 PCP: [facility]     Brief Narrative:  Jeffrey Blair is a 87 yo male with PMH Parkinson's disease, dementia, debility, hx cerebellar CVA,cervical DDD who was brought to the hospital for worsening balance/mobility, worsening confusion, and dysarthria. He was initially taken to [facility] ER, but patient got agitated with a very long wait time, signed out AMA by family, then [facility] MRI showed an acute widespread infarct involving approximately a 7 cm area of the right MCA/PCA territory,in addition to chronic infarcts. He failed a swallow eval performed on 07/27/2020 with speech therapy, later started on dysphagia 1 diet.   New events last 24 hours / Subjective: Overnight, patient had episode of tachycardia, hypotension had severe left flaccid hemiparesis and underwent repeat head CT which revealed new subtle hypodensities adjacent to the evolving subacute right MCA distribution infarct concerning for new acute component/interval expansion.  This morning, patient's blood pressure remains low with SBP 80s.  His left upper extremity remains flaccid.  Discussed with 2 sons, Jeffrey Blair over the phone and Jeffrey Blair at bedside.  Patient has 8 children and Jeffrey Blair holds healthcare power of attorney.  Assessment & Plan:   Active Problems:   COPD (chronic obstructive pulmonary disease) (HCC)   Parkinson disease (HCC)   Hyperlipidemia   Hypertension   Normocytic anemia   Acute CVA (cerebrovascular accident) ([place])   Acute ischemic stroke ([place])   Acute right MCA CVA -Appreciate neurology, recommended for aspirin 325 mg daily, Plavix 75 mg daily for 3 months followed by aspirin daily -Recommended for 30-day event monitor as an outpatient but unclear if patient would be able to comply/quality of recording -Continue Lipitor -PT OT  recommended SNF, however insurance auth denied -Repeat head CT 1/6 revealed suspected new subtle hypodensities adjacent to the evolving subacute right MCA distribution infarct as above, concerning for new acute component/interval expansion -Neurology following  Severe sepsis secondary to gram-negative rod bacteremia, sepsis not present on admission -Fever with T-max 101 on 1/5 along with tachycardia with heart rate 130s, tachypnea with respiratory rate 20 and hypotension with SBP 70-80s.  -Blood culture obtained 1/5 now showing gram-negative rod, identification and sensitivity pending  -Start IV Zosyn -Obtain UA and urine culture -Will hold off on CT abdomen pelvis until hemodynamically stable  -PCCM consulted overnight, discussed with Dr. Shearon Stalls over the phone.  We will start broad-spectrum antibiotics and IV fluid to maintain MAP >65 -Labs pending this AM including lactic acid  -Midodrine to assist with BP   Persistent headache -Repeat CT head showed interval evolution of subacute right MCA infarct without hemorrhage or mass affect -Symptom management  History of Parkinson's disease, dementia -Continue Sinemet, primidone  Hypertension -Long-term blood pressure goal 130-150 given severe intracranial stenosis  Depression/anxiety -Continue Effexor   DVT prophylaxis:  enoxaparin (LOVENOX) injection 40 mg Start: 07/31/20 1800 SCDs Start: 07/26/20 0449  Code Status: DNR Family Communication: Discussed with 2 sons both over the phone and in person today Disposition Plan:  Status is: Inpatient  Remains inpatient appropriate because:Hemodynamically unstable, IV treatments appropriate due to intensity of illness or inability to take PO and Inpatient level of care appropriate due to severity of illness   Dispo: The patient is from: Home              Anticipated d/c is to: Home  Anticipated d/c date is: 3 days              Patient currently is not medically stable to d/c.  Starting IV antibiotics today, hemodynamically unstable for dc.    Antimicrobials:  Anti-infectives (From admission, onward)   None       Objective: Vitals:   08/06/20 0647 08/06/20 0839 08/06/20 0915 08/06/20 1000  BP: (!) 85/48 (!) 80/55 (!) 87/57 (!) 82/56  Pulse: (!) 107 (!) 107 (!) 118   Resp: 14 19 20    Temp:  (!) 97.4 F (36.3 C)    TempSrc:  Axillary    SpO2: 99% 99% 99%   Weight:      Height:        Intake/Output Summary (Last 24 hours) at 08/06/2020 1035 Last data filed at 08/06/2020 0600 Gross per 24 hour  Intake 100 ml  Output 175 ml  Net -75 ml   Filed Weights   07/25/20 1925  Weight: 72.6 kg    Examination: General exam: Appears calm and comfortable  Respiratory system: Clear to auscultation. Respiratory effort normal. Cardiovascular system: S1 & S2 heard, tachycardic, regular rhythm. No pedal edema. Gastrointestinal system: Abdomen is nondistended, soft  Central nervous system: Alert and oriented x 1.  Left upper extremity flaccid, able to move his left lower extremity spontaneously Extremities: Symmetric in appearance bilaterally    Data Reviewed: I have personally reviewed following labs and imaging studies  CBC: Recent Labs  Lab 07/31/20 0344 08/01/20 0151 08/04/20 0605  WBC 7.4 6.8 4.3  HGB 12.7* 12.8* 14.2  HCT 38.1* 36.8* 41.0  MCV 87.8 86.6 86.5  PLT 236 267 [id number]   Basic Metabolic Panel: Recent Labs  Lab 07/31/20 0344 08/01/20 0151 08/04/20 0605  NA 135 135 133*  K 3.5 3.3* 3.9  CL 96* 94* 96*  CO2 27 29 25   GLUCOSE 98 99 80  BUN 7* 8 22  CREATININE 0.62 0.70 0.81  CALCIUM 9.1 9.4 8.9   GFR: Estimated Creatinine Clearance: 62.2 mL/min (by C-G formula based on SCr of 0.81 mg/dL). Liver Function Tests: No results for input(s): AST, ALT, ALKPHOS, BILITOT, PROT, ALBUMIN in the last 168 hours. No results for input(s): LIPASE, AMYLASE in the last 168 hours. No results for input(s): AMMONIA in the last 168 hours. Coagulation  Profile: No results for input(s): INR, PROTIME in the last 168 hours. Cardiac Enzymes: No results for input(s): CKTOTAL, CKMB, CKMBINDEX, TROPONINI in the last 168 hours. BNP (last 3 results) No results for input(s): PROBNP in the last 8760 hours. HbA1C: No results for input(s): HGBA1C in the last 72 hours. CBG: Recent Labs  Lab 07/30/20 1137 08/05/20 2338  GLUCAP 139* 108*   Lipid Profile: No results for input(s): CHOL, HDL, LDLCALC, TRIG, CHOLHDL, LDLDIRECT in the last 72 hours. Thyroid Function Tests: No results for input(s): TSH, T4TOTAL, FREET4, T3FREE, THYROIDAB in the last 72 hours. Anemia Panel: No results for input(s): VITAMINB12, FOLATE, FERRITIN, TIBC, IRON, RETICCTPCT in the last 72 hours. Sepsis Labs: No results for input(s): PROCALCITON, LATICACIDVEN in the last 168 hours.  Recent Results (from the past 240 hour(s))  Resp Panel by RT-PCR (Flu A&B, Covid) Nasopharyngeal Swab     Status: None   Collection Time: 08/04/20  2:07 PM   Specimen: Nasopharyngeal Swab; Nasopharyngeal(NP) swabs in vial transport medium  Result Value Ref Range Status   SARS Coronavirus 2 by RT PCR NEGATIVE NEGATIVE Final    Comment: (NOTE) SARS-CoV-2 target nucleic acids are NOT  DETECTED.  The SARS-CoV-2 RNA is generally detectable in upper respiratory specimens during the acute phase of infection. The lowest concentration of SARS-CoV-2 viral copies this assay can detect is 138 copies/mL. A negative result does not preclude SARS-Cov-2 infection and should not be used as the sole basis for treatment or other patient management decisions. A negative result may occur with  improper specimen collection/handling, submission of specimen other than nasopharyngeal swab, presence of viral mutation(s) within the areas targeted by this assay, and inadequate number of viral copies(<138 copies/mL). A negative result must be combined with clinical observations, patient history, and  epidemiological information. The expected result is Negative.  Fact Sheet for Patients:  EntrepreneurPulse.com.au  Fact Sheet for Healthcare Providers:  IncredibleEmployment.be  This test is no t yet approved or cleared by the [place] FDA and  has been authorized for detection and/or diagnosis of SARS-CoV-2 by FDA under an Emergency Use Authorization (EUA). This EUA will remain  in effect (meaning this test can be used) for the duration of the COVID-19 declaration under Section 564(b)(1) of the Act, 21 U.S.C.section 360bbb-3(b)(1), unless the authorization is terminated  or revoked sooner.       Influenza A by PCR NEGATIVE NEGATIVE Final   Influenza B by PCR NEGATIVE NEGATIVE Final    Comment: (NOTE) The Xpert Xpress SARS-CoV-2/FLU/RSV plus assay is intended as an aid in the diagnosis of influenza from Nasopharyngeal swab specimens and should not be used as a sole basis for treatment. Nasal washings and aspirates are unacceptable for Xpert Xpress SARS-CoV-2/FLU/RSV testing.  Fact Sheet for Patients: EntrepreneurPulse.com.au  Fact Sheet for Healthcare Providers: IncredibleEmployment.be  This test is not yet approved or cleared by the [place] FDA and has been authorized for detection and/or diagnosis of SARS-CoV-2 by FDA under an Emergency Use Authorization (EUA). This EUA will remain in effect (meaning this test can be used) for the duration of the COVID-19 declaration under Section 564(b)(1) of the Act, 21 U.S.C. section 360bbb-3(b)(1), unless the authorization is terminated or revoked.  Performed at [facility], [place] [address], [place], [place] [address]   Culture, blood (routine x 2)     Status: None (Preliminary result)   Collection Time: 08/05/20  9:31 PM   Specimen: BLOOD  Result Value Ref Range Status   Specimen Description BLOOD SITE NOT SPECIFIED  Final   Special Requests    Final    BOTTLES DRAWN AEROBIC ONLY Blood Culture results may not be optimal due to an inadequate volume of blood received in culture bottles   Culture  Setup Time   Final    GRAM NEGATIVE RODS AEROBIC BOTTLE ONLY CRITICAL VALUE NOTED.  VALUE IS CONSISTENT WITH PREVIOUSLY REPORTED AND CALLED VALUE.    Culture   Final    NO GROWTH < 12 HOURS Performed at [facility], [place] [address] [place], [place] [address]    Report Status PENDING  Incomplete  Culture, blood (routine x 2)     Status: None (Preliminary result)   Collection Time: 08/05/20  9:31 PM   Specimen: BLOOD  Result Value Ref Range Status   Specimen Description BLOOD SITE NOT SPECIFIED  Final   Special Requests   Final    BOTTLES DRAWN AEROBIC ONLY Blood Culture adequate volume   Culture  Setup Time   Final    GRAM NEGATIVE RODS AEROBIC BOTTLE ONLY Organism ID to follow CRITICAL RESULT CALLED TO, READ BACK BY AND VERIFIED WITH: A. [facility] PharmD  10:10 08/06/20 ([email or web address])    Culture   Final    NO GROWTH < 12 HOURS Performed at [facility], [address], [place], [place] [address]    Report Status PENDING  Incomplete  Blood Culture ID Panel (Reflexed)     Status: Abnormal   Collection Time: 08/05/20  9:31 PM  Result Value Ref Range Status   Enterococcus faecalis NOT DETECTED NOT DETECTED Final   Enterococcus Faecium NOT DETECTED NOT DETECTED Final   Listeria monocytogenes NOT DETECTED NOT DETECTED Final   Staphylococcus species NOT DETECTED NOT DETECTED Final   Staphylococcus aureus (BCID) NOT DETECTED NOT DETECTED Final   Staphylococcus epidermidis NOT DETECTED NOT DETECTED Final   Staphylococcus lugdunensis NOT DETECTED NOT DETECTED Final   Streptococcus species NOT DETECTED NOT DETECTED Final   Streptococcus agalactiae NOT DETECTED NOT DETECTED Final   Streptococcus pneumoniae NOT DETECTED NOT DETECTED Final   Streptococcus pyogenes NOT DETECTED NOT DETECTED Final   A.calcoaceticus-baumannii NOT  DETECTED NOT DETECTED Final   Bacteroides fragilis NOT DETECTED NOT DETECTED Final   Enterobacterales DETECTED (A) NOT DETECTED Final    Comment: Enterobacterales represent a large order of gram negative bacteria, not a single organism. CRITICAL RESULT CALLED TO, READ BACK BY AND VERIFIED WITH: A. Artis Flock PharmD 10:10 08/06/20 ([email or web address])    Enterobacter cloacae complex NOT DETECTED NOT DETECTED Final   Escherichia coli DETECTED (A) NOT DETECTED Final    Comment: CRITICAL RESULT CALLED TO, READ BACK BY AND VERIFIED WITH: A. Artis Flock PharmD 10:10 08/06/20 ([email or web address])    Klebsiella aerogenes NOT DETECTED NOT DETECTED Final   Klebsiella oxytoca NOT DETECTED NOT DETECTED Final   Klebsiella pneumoniae NOT DETECTED NOT DETECTED Final   Proteus species NOT DETECTED NOT DETECTED Final   Salmonella species NOT DETECTED NOT DETECTED Final   Serratia marcescens NOT DETECTED NOT DETECTED Final   Haemophilus influenzae NOT DETECTED NOT DETECTED Final   Neisseria meningitidis NOT DETECTED NOT DETECTED Final   Pseudomonas aeruginosa NOT DETECTED NOT DETECTED Final   Stenotrophomonas maltophilia NOT DETECTED NOT DETECTED Final   Candida albicans NOT DETECTED NOT DETECTED Final   Candida auris NOT DETECTED NOT DETECTED Final   Candida glabrata NOT DETECTED NOT DETECTED Final   Candida krusei NOT DETECTED NOT DETECTED Final   Candida parapsilosis NOT DETECTED NOT DETECTED Final   Candida tropicalis NOT DETECTED NOT DETECTED Final   Cryptococcus neoformans/gattii NOT DETECTED NOT DETECTED Final   CTX-M ESBL NOT DETECTED NOT DETECTED Final   Carbapenem resistance IMP NOT DETECTED NOT DETECTED Final   Carbapenem resistance KPC NOT DETECTED NOT DETECTED Final   Carbapenem resistance NDM NOT DETECTED NOT DETECTED Final   Carbapenem resist OXA 48 LIKE NOT DETECTED NOT DETECTED Final   Carbapenem resistance VIM NOT DETECTED NOT DETECTED Final    Comment: Performed at [facility], [address]  [place], [place] [address]      Radiology Studies: CT HEAD WO CONTRAST  Result Date: 08/06/2020 CLINICAL DATA:  Follow-up examination for stroke. EXAM: CT HEAD WITHOUT CONTRAST TECHNIQUE: Contiguous axial images were obtained from the base of the skull through the vertex without intravenous contrast. COMPARISON:  Prior head CT from 08/03/2020. FINDINGS: Brain: Normal expected interval evolution of subacute right MCA distribution infarct, relatively stable in size and distribution from previous. Few serpiginous hyperdensities within the area of infarction could reflect laminar necrosis and/or petechial hemorrhage (series 6, image 19). No evidence for malignant hemorrhage or transformation or significant regional mass effect.  There is question of new subtle hypodensity involving the posterior right insular cortex and overlying operculum, not definitely seen on prior exam (series 3, images 16, 18). Additionally, question subtle hypodensity/fogging more superiorly at the posterior right frontoparietal region (series 3, image 24). These findings appear new and/or increased from previous CT and/or MRI, and are concerning for possible new acute component/expansion of the right MCA territory infarct. No associated hemorrhage. No other acute large vessel territory infarct. Underlying atrophy with chronic small vessel ischemic disease again noted. No mass lesion or midline shift. No hydrocephalus or extra-axial fluid collection. Vascular: No hyperdense vessel. Scattered vascular calcifications noted within the carotid siphons. Skull: Scalp soft tissues and calvarium demonstrate no acute finding. Sinuses/Orbits: Globes and orbital soft tissues remain within normal limits. Paranasal sinuses mastoid air cells are clear. Other: None. IMPRESSION: 1. Suspected new subtle hypodensities adjacent to the evolving subacute right MCA distribution infarct as above, concerning for new acute component/interval expansion. No associated  hemorrhage or mass effect. 2. Otherwise normal expected interval evolution of the subacute right MCA distribution infarct. Few serpiginous hyperdensities within the area of infarction could reflect developing laminar necrosis and/or petechial hemorrhage. No evidence for malignant hemorrhagic transformation or significant regional mass effect. 3. No other new acute intracranial abnormality. Results were called by telephone at the time of interpretation on 08/06/2020 at 1:07 am to provider [facility]. Electronically Signed   By: Jeannine Boga M.D.   On: 08/06/2020 01:18   DG CHEST PORT 1 VIEW  Result Date: 08/05/2020 CLINICAL DATA:  87 year old male with fever. EXAM: PORTABLE CHEST 1 VIEW COMPARISON:  Chest radiograph dated 01/30/2016 FINDINGS: No focal consolidation, pleural effusion or pneumothorax. Mild chronic interstitial coarsening and bronchitic changes. The cardiac silhouette is within limits. No acute osseous pathology. Osteopenia IMPRESSION: No active disease. Electronically Signed   By: Anner Crete M.D.   On: 08/05/2020 18:27      Scheduled Meds: . aspirin EC  325 mg Oral Daily  . atorvastatin  40 mg Oral Daily  . carbidopa-levodopa  3 tablet Oral BID  . clopidogrel  75 mg Oral Daily  . enoxaparin (LOVENOX) injection  40 mg Subcutaneous Q24H  . midodrine  10 mg Oral TID WC  . pantoprazole  40 mg Oral Daily  . polyethylene glycol  17 g Oral Daily  . primidone  150 mg Oral BID  . umeclidinium bromide  1 puff Inhalation Daily  . venlafaxine  37.5 mg Oral BID  . vitamin B-12  1,000 mcg Oral Daily   Continuous Infusions: . sodium chloride 100 mL/hr at 08/06/20 1023     LOS: 10 days      Time spent: 40 minutes   Dessa Phi, DO [facility] 08/06/2020, 10:35 AM   Available via Epic secure chat 7am-7pm After these hours, please refer to coverage provider listed on [email or web address]

## 2020-08-06 NOTE — TOC Progression Note (Signed)
Transition of Care [facility]) - Progression Note    Patient Details  Name: Jeffrey Blair MRN: [id number] Date of Birth: 26-Dec-1927  Transition of Care [facility]) CM/SW Contact  Baldemar [place], [place] Phone Number: 08/06/2020, 12:36 PM  Clinical Narrative:   CSW received call from [place] with [employer] that the medical director is denying the request for SNF at this time as the patient does not seem stable enough for discharge. CSW agreed, as per chart review there was rapid called and continued medical issues today, per discussion with MD. CSW to continue to follow.    Expected Discharge Plan: Skilled Nursing Facility Barriers to Discharge: Continued Medical Work up  Expected Discharge Plan and Services Expected Discharge Plan: Skilled Nursing Facility In-house Referral: Clinical Social Work   Post Acute Care Choice: Skilled Nursing Facility Living arrangements for the past 2 months: Single Family Home                                       Social Determinants of Health (SDOH) Interventions    Readmission Risk Interventions No flowsheet data found.

## 2020-08-06 NOTE — Progress Notes (Signed)
STROKE TEAM PROGRESS NOTE   INTERVAL HISTORY  His son is at the bedside. He reports patient has been agitated and complaining of both frontal headache and right flank pain. Discussed patient's decline in past 24 hours, reviewed brain imaging and discussed stroke diagnosis and prognosis.   Vitals:   08/06/20 0839 08/06/20 0915 08/06/20 1000 08/06/20 1204  BP: (!) 80/55 (!) 87/57 (!) 82/56 (!) 90/51  Pulse: (!) 107 (!) 118  (!) 107  Resp: 19 20  16   Temp: (!) 97.4 F (36.3 C)   98.4 F (36.9 C)  TempSrc: Axillary   Axillary  SpO2: 99% 99%  98%  Weight:      Height:       CBC:  Recent Labs  Lab 08/04/20 0605 08/06/20 1143  WBC 4.3 22.4*  HGB 14.2 11.7*  HCT 41.0 35.4*  MCV 86.5 91.5  PLT 189 0000000*   Basic Metabolic Panel:  Recent Labs  Lab 08/04/20 0605 08/06/20 1143  NA 133* 136  K 3.9 4.0  CL 96* 102  CO2 25 19*  GLUCOSE 80 76  BUN 22 30*  CREATININE 0.81 1.91*  CALCIUM 8.9 8.1*   Lipid Panel: No results for input(s): CHOL, TRIG, HDL, CHOLHDL, VLDL, LDLCALC in the last 168 hours. HgbA1c: No results for input(s): HGBA1C in the last 168 hours. Urine Drug Screen: No results for input(s): LABOPIA, COCAINSCRNUR, LABBENZ, AMPHETMU, THCU, LABBARB in the last 168 hours.  Alcohol Level No results for input(s): ETH in the last 168 hours.  IMAGING past 24 hours CT HEAD WO CONTRAST  Result Date: 08/06/2020 CLINICAL DATA:  Follow-up examination for stroke. EXAM: CT HEAD WITHOUT CONTRAST TECHNIQUE: Contiguous axial images were obtained from the base of the skull through the vertex without intravenous contrast. COMPARISON:  Prior head CT from 08/03/2020. FINDINGS: Brain: Normal expected interval evolution of subacute right MCA distribution infarct, relatively stable in size and distribution from previous. Few serpiginous hyperdensities within the area of infarction could reflect laminar necrosis and/or petechial hemorrhage (series 6, image 19). No evidence for malignant hemorrhage  or transformation or significant regional mass effect. There is question of new subtle hypodensity involving the posterior right insular cortex and overlying operculum, not definitely seen on prior exam (series 3, images 16, 18). Additionally, question subtle hypodensity/fogging more superiorly at the posterior right frontoparietal region (series 3, image 24). These findings appear new and/or increased from previous CT and/or MRI, and are concerning for possible new acute component/expansion of the right MCA territory infarct. No associated hemorrhage. No other acute large vessel territory infarct. Underlying atrophy with chronic small vessel ischemic disease again noted. No mass lesion or midline shift. No hydrocephalus or extra-axial fluid collection. Vascular: No hyperdense vessel. Scattered vascular calcifications noted within the carotid siphons. Skull: Scalp soft tissues and calvarium demonstrate no acute finding. Sinuses/Orbits: Globes and orbital soft tissues remain within normal limits. Paranasal sinuses mastoid air cells are clear. Other: None. IMPRESSION: 1. Suspected new subtle hypodensities adjacent to the evolving subacute right MCA distribution infarct as above, concerning for new acute component/interval expansion. No associated hemorrhage or mass effect. 2. Otherwise normal expected interval evolution of the subacute right MCA distribution infarct. Few serpiginous hyperdensities within the area of infarction could reflect developing laminar necrosis and/or petechial hemorrhage. No evidence for malignant hemorrhagic transformation or significant regional mass effect. 3. No other new acute intracranial abnormality. Results were called by telephone at the time of interpretation on 08/06/2020 at 1:07 am to provider [facility]. Electronically  Signed   By: Rise Mu M.D.   On: 08/06/2020 01:18   DG CHEST PORT 1 VIEW  Result Date: 08/05/2020 CLINICAL DATA:  87 year old male with fever.  EXAM: PORTABLE CHEST 1 VIEW COMPARISON:  Chest radiograph dated 01/30/2016 FINDINGS: No focal consolidation, pleural effusion or pneumothorax. Mild chronic interstitial coarsening and bronchitic changes. The cardiac silhouette is within limits. No acute osseous pathology. Osteopenia IMPRESSION: No active disease. Electronically Signed   By: Elgie Collard M.D.   On: 08/05/2020 18:27   PHYSICAL EXAM Frail edentulous malnourished looking elderly male lying in bed, agitated and restless. . Afebrile. Head is nontraumatic. Neck is supple without bruit.    Cardiac exam no murmur or gallop. Lungs are clear to auscultation. Distal pulses are well felt. Neurological Exam : Patient is awake alert moaning and groaning.  Diminished attention: Registration recall.  Poor insight into his condition.  Easily distractible.  Following simple midline and some one-step commands.  Extractor movements are full range morphology as consistently.  Not blinking to threat consistently on either side.  Dysarthric speech.  Right lower facial weakness.  Tongue midline.  Motor system exam shows dense left hemiplegia with only trace withdrawal to painful stimuli in the left upper and lower extremities.  Purposeful antigravity strength in the right upper extremity.  Will not move right lower extremity voluntarily but will withdraw to pain to the greater extent than the left leg.  Sensation, coordination and gait cannot be tested reliably    ASSESSMENT/PLAN [name] is a 87 y.o. male with history of  Parkinson's disease, dementia, debility, hx cerebellar CVA,cervical DDD who was brought to the hospital for worsening balance/mobility, worsening confusion, and dysarthria. Admitted to [facility] with  acute widespread infarct involving approximately a 7 cm area of the right MCA/PCA territory,in addition to chronic infarcts. Rapid response and stroke team, Dr. Amada Jupiter, was called for patient last pm due to hypotension to 60s  systolic and severe left flaccid hemiparesis in the setting of multiple intracranial stenoses and sepsis.    Optimize hemodynamics   Revisit swallow eval   Prognosis and goals of care discussion with son.   Palliative care also consulted today.  I had a long discussion with the patient and son regarding his significant worsening of hemiaplasia in the setting of hypotension and ongoing sepsis.  Do not think further neuroimaging studies are going to alter the outcome.  Optimize blood pressure and keep MAP greater than 80.  Appropriate treatment for sepsis with antibiotics and fluid resuscitation as per family medical team.  Continue aspirin and Plavix for 3 months and aggressive risk factor modification.  Given patient's background history of severe Parkinson's disease dementia and now significant deficits from stroke I think his long-term prognosis is quite poor consulting palliative care team-discussion about goals of care is appropriate.  Discussed with patient, son and Dr. Alvino Chapel.  Greater than 50% time during this 35-minute visit was spent on counseling and coordination of care about this neurological worsening and progressive strokes and discussion about effect of hypertension and sepsis and stroke and answering questions.  Stroke team will sign off.  Kindly call for questions Delia Heady, MD   To contact Stroke Continuity provider, please refer to [email or web address]. After hours, contact General Neurology

## 2020-08-06 NOTE — Progress Notes (Signed)
Updated son [name] about the event last night and the new orders placed. Son will like to be called if the patient is moved or any other changes.

## 2020-08-07 ENCOUNTER — Inpatient Hospital Stay (HOSPITAL_COMMUNITY): Payer: PPO

## 2020-08-07 DIAGNOSIS — I10 Essential (primary) hypertension: Secondary | ICD-10-CM | POA: Diagnosis not present

## 2020-08-07 LAB — BASIC METABOLIC PANEL
Anion gap: 11 (ref 5–15)
BUN: 39 mg/dL — ABNORMAL HIGH (ref 8–23)
CO2: 22 mmol/L (ref 22–32)
Calcium: 7.5 mg/dL — ABNORMAL LOW (ref 8.9–10.3)
Chloride: 102 mmol/L (ref 98–111)
Creatinine, Ser: 2.13 mg/dL — ABNORMAL HIGH (ref 0.61–1.24)
GFR, Estimated: 29 mL/min — ABNORMAL LOW (ref >60)
Glucose, Bld: 75 mg/dL (ref 70–99)
Potassium: 4.2 mmol/L (ref 3.5–5.1)
Sodium: 135 mmol/L (ref 135–145)

## 2020-08-07 LAB — CBC
HCT: 34.7 % — ABNORMAL LOW (ref 39.0–52.0)
Hemoglobin: 11.6 g/dL — ABNORMAL LOW (ref 13.0–17.0)
MCH: 29.2 pg (ref 26.0–34.0)
MCHC: 33.4 g/dL (ref 30.0–36.0)
MCV: 87.4 fL (ref 80.0–100.0)
Platelets: 122 10*3/uL — ABNORMAL LOW (ref 150–400)
RBC: 3.97 MIL/uL — ABNORMAL LOW (ref 4.22–5.81)
RDW: 13.4 % (ref 11.5–15.5)
WBC: 12 10*3/uL — ABNORMAL HIGH (ref 4.0–10.5)
nRBC: 0 % (ref 0.0–0.2)

## 2020-08-07 LAB — URINE CULTURE

## 2020-08-07 LAB — LACTIC ACID, PLASMA
Lactic Acid, Venous: 1.8 mmol/L (ref 0.5–1.9)
Lactic Acid, Venous: 1.8 mmol/L (ref 0.5–1.9)

## 2020-08-07 MED ORDER — SODIUM CHLORIDE 0.9 % IV BOLUS
500.0000 mL | Freq: Once | INTRAVENOUS | Status: AC
  Administered 2020-08-07: 500 mL via INTRAVENOUS

## 2020-08-07 NOTE — Progress Notes (Signed)
OT Cancellation Note  Patient Details Name: Jeffrey Blair MRN: [id number] DOB: [birth year]   Cancelled Treatment:    Reason Eval/Treat Not Completed: Patient with another discipline at this time. OT to check back as time allows.   Gloris Manchester OTR/L Supplemental OT, Department of rehab services [phone]   [name] 08/07/2020, 2:47 PM

## 2020-08-07 NOTE — Progress Notes (Signed)
PROGRESS NOTE    Jeffrey Blair  WLN:[id number] DOB: April 23, 1928 DOA: 07/25/2020 PCP: [facility]     Brief Narrative:  Jeffrey Blair is a 87 yo male with PMH Parkinson's disease, dementia, debility, hx cerebellar CVA,cervical DDD who was brought to the hospital for worsening balance/mobility, worsening confusion, and dysarthria. He was initially taken to [facility] ER, but patient got agitated with a very long wait time, signed out AMA by family, then [facility] MRI showed an acute widespread infarct involving approximately a 7 cm area of the right MCA/PCA territory,in addition to chronic infarcts. He failed a swallow eval performed on 07/27/2020 with speech therapy, later started on dysphagia 1 diet.   Overnight 1/5-1/6, patient had episode of tachycardia, hypotension had severe left flaccid hemiparesis and underwent repeat head CT which revealed new subtle hypodensities adjacent to the evolving subacute right MCA distribution infarct concerning for new acute component/interval expansion. Blood culture resulted with E Coli and patient was started on IV antibiotics, continued with IVF.   New events last 24 hours / Subjective: Patient asking for water.  Does not interact in other meaningful ways.  Does not answer questions appropriately.  Assessment & Plan:   Principal Problem:   Acute ischemic stroke (HCC) Active Problems:   COPD (chronic obstructive pulmonary disease) (HCC)   Parkinson disease (HCC)   Hyperlipidemia   Hypertension   Normocytic anemia   Acute CVA (cerebrovascular accident) ([place])   Acute right MCA CVA -Appreciate neurology, recommended for aspirin 325 mg daily, Plavix 75 mg daily for 3 months followed by aspirin daily -Recommended for 30-day event monitor as an outpatient but unclear if patient would be able to comply/quality of recording -Continue Lipitor -Repeat head CT 1/6  revealed suspected new subtle hypodensities adjacent to the evolving subacute right MCA distribution infarct as above, concerning for new acute component/interval expansion -Recommended to optimize blood pressure and to keep MAP >80 -PT OT recommended SNF, however insurance auth denied. Disposition pending patient's clinical condition   Severe sepsis secondary to E. coli bacteremia, sepsis not present on admission -Fever with T-max 101 on 1/5 along with tachycardia with heart rate 130s, tachypnea with respiratory rate 20 and hypotension with SBP 70-80s.  -Blood culture obtained 1/5 now showing E. coli, susceptibility pending -Continue Rocephin -Urine culture pending -Appreciate PCCM -Midodrine to assist with BP -Continue IVF  AKI -Continue IVF and avoid nephrotoxins.  Check renal ultrasound today  Persistent headache -Repeat CT head showed interval evolution of subacute right MCA infarct without hemorrhage or mass affect -Symptom management  History of Parkinson's disease, dementia -Continue Sinemet, primidone  Hypertension -Long-term blood pressure goal 130-150 given severe intracranial stenosis  Depression/anxiety -Continue Effexor   DVT prophylaxis:  enoxaparin (LOVENOX) injection 30 mg Start: 08/06/20 1800 SCDs Start: 07/26/20 0449  Code Status: DNR Family Communication: No family at bedside, spoke with son over the phone today  Disposition Plan:  Status is: Inpatient  Remains inpatient appropriate because:Hemodynamically unstable, IV treatments appropriate due to intensity of illness or inability to take PO and Inpatient level of care appropriate due to severity of illness   Dispo: The patient is from: Home              Anticipated d/c is to: Home              Anticipated d/c date is: 3 days  Patient currently is not medically stable to d/c.  Remains on IV antibiotic, IV fluid today.  He will need improvement in renal function, sepsis, mentation prior to  discharge   Antimicrobials:  Anti-infectives (From admission, onward)   Start     Dose/Rate Route Frequency Ordered Stop   08/06/20 1200  cefTRIAXone (ROCEPHIN) 2 g in sodium chloride 0.9 % 100 mL IVPB        2 g 200 mL/hr over 30 Minutes Intravenous Every 24 hours 08/06/20 1049         Objective: Vitals:   08/06/20 2315 08/07/20 0319 08/07/20 0718 08/07/20 0903  BP: (!) 95/48 (!) 108/57 (!) 117/59   Pulse: (!) 102 (!) 111 (!) 109 (!) 119  Resp: (!) 21 (!) 21 18 (!) 24  Temp: 98.9 F (37.2 C) 99.4 F (37.4 C) 99.2 F (37.3 C)   TempSrc: Axillary Axillary Axillary   SpO2: 97% 98% 98% 95%  Weight:      Height:        Intake/Output Summary (Last 24 hours) at 08/07/2020 1041 Last data filed at 08/07/2020 0500 Gross per 24 hour  Intake 1587.45 ml  Output 450 ml  Net 1137.45 ml   Filed Weights   07/25/20 1925  Weight: 72.6 kg    Examination: General exam: Appears calm and comfortable  Respiratory system: Clear to auscultation. Respiratory effort normal. Cardiovascular system: S1 & S2 heard, tachycardic, regular rhythm. No pedal edema. Gastrointestinal system: Abdomen is nondistended, soft and nontender. Normal bowel sounds heard. Central nervous system: Alert Extremities: Symmetric in appearance bilaterally  Skin: No rashes, lesions or ulcers on exposed skin    Data Reviewed: I have personally reviewed following labs and imaging studies  CBC: Recent Labs  Lab 08/01/20 0151 08/04/20 0605 08/06/20 1143 08/07/20 0203  WBC 6.8 4.3 22.4* 12.0*  HGB 12.8* 14.2 11.7* 11.6*  HCT 36.8* 41.0 35.4* 34.7*  MCV 86.6 86.5 91.5 87.4  PLT 267 189 137* 123XX123*   Basic Metabolic Panel: Recent Labs  Lab 08/01/20 0151 08/04/20 0605 08/06/20 1143 08/07/20 0203  NA 135 133* 136 135  K 3.3* 3.9 4.0 4.2  CL 94* 96* 102 102  CO2 29 25 19* 22  GLUCOSE 99 80 76 75  BUN 8 22 30* 39*  CREATININE 0.70 0.81 1.91* 2.13*  CALCIUM 9.4 8.9 8.1* 7.5*   GFR: Estimated Creatinine  Clearance: 23.7 mL/min (A) (by C-G formula based on SCr of 2.13 mg/dL (H)). Liver Function Tests: No results for input(s): AST, ALT, ALKPHOS, BILITOT, PROT, ALBUMIN in the last 168 hours. No results for input(s): LIPASE, AMYLASE in the last 168 hours. No results for input(s): AMMONIA in the last 168 hours. Coagulation Profile: No results for input(s): INR, PROTIME in the last 168 hours. Cardiac Enzymes: No results for input(s): CKTOTAL, CKMB, CKMBINDEX, TROPONINI in the last 168 hours. BNP (last 3 results) No results for input(s): PROBNP in the last 8760 hours. HbA1C: No results for input(s): HGBA1C in the last 72 hours. CBG: Recent Labs  Lab 08/05/20 2338  GLUCAP 108*   Lipid Profile: No results for input(s): CHOL, HDL, LDLCALC, TRIG, CHOLHDL, LDLDIRECT in the last 72 hours. Thyroid Function Tests: No results for input(s): TSH, T4TOTAL, FREET4, T3FREE, THYROIDAB in the last 72 hours. Anemia Panel: No results for input(s): VITAMINB12, FOLATE, FERRITIN, TIBC, IRON, RETICCTPCT in the last 72 hours. Sepsis Labs: Recent Labs  Lab 08/06/20 1143 08/06/20 1337  LATICACIDVEN 4.8* 5.1*    Recent Results (from the  past 240 hour(s))  Resp Panel by RT-PCR (Flu A&B, Covid) Nasopharyngeal Swab     Status: None   Collection Time: 08/04/20  2:07 PM   Specimen: Nasopharyngeal Swab; Nasopharyngeal(NP) swabs in vial transport medium  Result Value Ref Range Status   SARS Coronavirus 2 by RT PCR NEGATIVE NEGATIVE Final    Comment: (NOTE) SARS-CoV-2 target nucleic acids are NOT DETECTED.  The SARS-CoV-2 RNA is generally detectable in upper respiratory specimens during the acute phase of infection. The lowest concentration of SARS-CoV-2 viral copies this assay can detect is 138 copies/mL. A negative result does not preclude SARS-Cov-2 infection and should not be used as the sole basis for treatment or other patient management decisions. A negative result may occur with  improper specimen  collection/handling, submission of specimen other than nasopharyngeal swab, presence of viral mutation(s) within the areas targeted by this assay, and inadequate number of viral copies(<138 copies/mL). A negative result must be combined with clinical observations, patient history, and epidemiological information. The expected result is Negative.  Fact Sheet for Patients:  EntrepreneurPulse.com.au  Fact Sheet for Healthcare Providers:  IncredibleEmployment.be  This test is no t yet approved or cleared by the [place] FDA and  has been authorized for detection and/or diagnosis of SARS-CoV-2 by FDA under an Emergency Use Authorization (EUA). This EUA will remain  in effect (meaning this test can be used) for the duration of the COVID-19 declaration under Section 564(b)(1) of the Act, 21 U.S.C.section 360bbb-3(b)(1), unless the authorization is terminated  or revoked sooner.       Influenza A by PCR NEGATIVE NEGATIVE Final   Influenza B by PCR NEGATIVE NEGATIVE Final    Comment: (NOTE) The Xpert Xpress SARS-CoV-2/FLU/RSV plus assay is intended as an aid in the diagnosis of influenza from Nasopharyngeal swab specimens and should not be used as a sole basis for treatment. Nasal washings and aspirates are unacceptable for Xpert Xpress SARS-CoV-2/FLU/RSV testing.  Fact Sheet for Patients: EntrepreneurPulse.com.au  Fact Sheet for Healthcare Providers: IncredibleEmployment.be  This test is not yet approved or cleared by the [place] FDA and has been authorized for detection and/or diagnosis of SARS-CoV-2 by FDA under an Emergency Use Authorization (EUA). This EUA will remain in effect (meaning this test can be used) for the duration of the COVID-19 declaration under Section 564(b)(1) of the Act, 21 U.S.C. section 360bbb-3(b)(1), unless the authorization is terminated or revoked.  Performed at [facility], [place] [address] [place], [place] [address]   Culture, blood (routine x 2)     Status: None (Preliminary result)   Collection Time: 08/05/20  9:31 PM   Specimen: BLOOD  Result Value Ref Range Status   Specimen Description BLOOD SITE NOT SPECIFIED  Final   Special Requests   Final    BOTTLES DRAWN AEROBIC ONLY Blood Culture results may not be optimal due to an inadequate volume of blood received in culture bottles   Culture  Setup Time   Final    GRAM NEGATIVE RODS AEROBIC BOTTLE ONLY CRITICAL VALUE NOTED.  VALUE IS CONSISTENT WITH PREVIOUSLY REPORTED AND CALLED VALUE. Performed at [facility], [place] [address] [place], [place] [address]    Culture GRAM NEGATIVE RODS  Final   Report Status PENDING  Incomplete  Culture, blood (routine x 2)     Status: Abnormal (Preliminary result)   Collection Time: 08/05/20  9:31 PM   Specimen: BLOOD  Result Value Ref Range Status   Specimen Description  BLOOD SITE NOT SPECIFIED  Final   Special Requests   Final    BOTTLES DRAWN AEROBIC ONLY Blood Culture adequate volume   Culture  Setup Time   Final    GRAM NEGATIVE RODS AEROBIC BOTTLE ONLY Organism ID to follow CRITICAL RESULT CALLED TO, READ BACK BY AND VERIFIED WITH: A. Rogers Blocker PharmD 10:10 08/06/20 ([email or web address])    Culture (A)  Final    ESCHERICHIA COLI SUSCEPTIBILITIES TO FOLLOW Performed at [facility], [place] [address], [place], [place] [address]    Report Status PENDING  Incomplete  Blood Culture ID Panel (Reflexed)     Status: Abnormal   Collection Time: 08/05/20  9:31 PM  Result Value Ref Range Status   Enterococcus faecalis NOT DETECTED NOT DETECTED Final   Enterococcus Faecium NOT DETECTED NOT DETECTED Final   Listeria monocytogenes NOT DETECTED NOT DETECTED Final   Staphylococcus species NOT DETECTED NOT DETECTED Final   Staphylococcus aureus (BCID) NOT DETECTED NOT DETECTED Final   Staphylococcus epidermidis NOT DETECTED NOT DETECTED Final   Staphylococcus  lugdunensis NOT DETECTED NOT DETECTED Final   Streptococcus species NOT DETECTED NOT DETECTED Final   Streptococcus agalactiae NOT DETECTED NOT DETECTED Final   Streptococcus pneumoniae NOT DETECTED NOT DETECTED Final   Streptococcus pyogenes NOT DETECTED NOT DETECTED Final   A.calcoaceticus-baumannii NOT DETECTED NOT DETECTED Final   Bacteroides fragilis NOT DETECTED NOT DETECTED Final   Enterobacterales DETECTED (A) NOT DETECTED Final    Comment: Enterobacterales represent a large order of gram negative bacteria, not a single organism. CRITICAL RESULT CALLED TO, READ BACK BY AND VERIFIED WITH: A. Rogers Blocker PharmD 10:10 08/06/20 ([email or web address])    Enterobacter cloacae complex NOT DETECTED NOT DETECTED Final   Escherichia coli DETECTED (A) NOT DETECTED Final    Comment: CRITICAL RESULT CALLED TO, READ BACK BY AND VERIFIED WITH: A. Rogers Blocker PharmD 10:10 08/06/20 ([email or web address])    Klebsiella aerogenes NOT DETECTED NOT DETECTED Final   Klebsiella oxytoca NOT DETECTED NOT DETECTED Final   Klebsiella pneumoniae NOT DETECTED NOT DETECTED Final   Proteus species NOT DETECTED NOT DETECTED Final   Salmonella species NOT DETECTED NOT DETECTED Final   Serratia marcescens NOT DETECTED NOT DETECTED Final   Haemophilus influenzae NOT DETECTED NOT DETECTED Final   Neisseria meningitidis NOT DETECTED NOT DETECTED Final   Pseudomonas aeruginosa NOT DETECTED NOT DETECTED Final   Stenotrophomonas maltophilia NOT DETECTED NOT DETECTED Final   Candida albicans NOT DETECTED NOT DETECTED Final   Candida auris NOT DETECTED NOT DETECTED Final   Candida glabrata NOT DETECTED NOT DETECTED Final   Candida krusei NOT DETECTED NOT DETECTED Final   Candida parapsilosis NOT DETECTED NOT DETECTED Final   Candida tropicalis NOT DETECTED NOT DETECTED Final   Cryptococcus neoformans/gattii NOT DETECTED NOT DETECTED Final   CTX-M ESBL NOT DETECTED NOT DETECTED Final   Carbapenem resistance IMP NOT DETECTED NOT DETECTED Final   Carbapenem  resistance KPC NOT DETECTED NOT DETECTED Final   Carbapenem resistance NDM NOT DETECTED NOT DETECTED Final   Carbapenem resist OXA 48 LIKE NOT DETECTED NOT DETECTED Final   Carbapenem resistance VIM NOT DETECTED NOT DETECTED Final    Comment: Performed at [facility], [address] [place], [place] [address]  Culture, Urine     Status: Abnormal   Collection Time: 08/06/20  1:14 PM   Specimen: Urine, Random  Result Value Ref Range Status   Specimen Description URINE, RANDOM  Final   Special Requests   Final  NONE Performed at [facility], [place] [address], [place], [place] [address]    Culture MULTIPLE SPECIES PRESENT, SUGGEST RECOLLECTION (A)  Final   Report Status 08/07/2020 FINAL  Final      Radiology Studies: CT HEAD WO CONTRAST  Result Date: 08/06/2020 CLINICAL DATA:  Follow-up examination for stroke. EXAM: CT HEAD WITHOUT CONTRAST TECHNIQUE: Contiguous axial images were obtained from the base of the skull through the vertex without intravenous contrast. COMPARISON:  Prior head CT from 08/03/2020. FINDINGS: Brain: Normal expected interval evolution of subacute right MCA distribution infarct, relatively stable in size and distribution from previous. Few serpiginous hyperdensities within the area of infarction could reflect laminar necrosis and/or petechial hemorrhage (series 6, image 19). No evidence for malignant hemorrhage or transformation or significant regional mass effect. There is question of new subtle hypodensity involving the posterior right insular cortex and overlying operculum, not definitely seen on prior exam (series 3, images 16, 18). Additionally, question subtle hypodensity/fogging more superiorly at the posterior right frontoparietal region (series 3, image 24). These findings appear new and/or increased from previous CT and/or MRI, and are concerning for possible new acute component/expansion of the right MCA territory infarct. No associated hemorrhage. No  other acute large vessel territory infarct. Underlying atrophy with chronic small vessel ischemic disease again noted. No mass lesion or midline shift. No hydrocephalus or extra-axial fluid collection. Vascular: No hyperdense vessel. Scattered vascular calcifications noted within the carotid siphons. Skull: Scalp soft tissues and calvarium demonstrate no acute finding. Sinuses/Orbits: Globes and orbital soft tissues remain within normal limits. Paranasal sinuses mastoid air cells are clear. Other: None. IMPRESSION: 1. Suspected new subtle hypodensities adjacent to the evolving subacute right MCA distribution infarct as above, concerning for new acute component/interval expansion. No associated hemorrhage or mass effect. 2. Otherwise normal expected interval evolution of the subacute right MCA distribution infarct. Few serpiginous hyperdensities within the area of infarction could reflect developing laminar necrosis and/or petechial hemorrhage. No evidence for malignant hemorrhagic transformation or significant regional mass effect. 3. No other new acute intracranial abnormality. Results were called by telephone at the time of interpretation on 08/06/2020 at 1:07 am to provider [facility]. Electronically Signed   By: Jeannine Boga M.D.   On: 08/06/2020 01:18   DG CHEST PORT 1 VIEW  Result Date: 08/05/2020 CLINICAL DATA:  87 year old male with fever. EXAM: PORTABLE CHEST 1 VIEW COMPARISON:  Chest radiograph dated 01/30/2016 FINDINGS: No focal consolidation, pleural effusion or pneumothorax. Mild chronic interstitial coarsening and bronchitic changes. The cardiac silhouette is within limits. No acute osseous pathology. Osteopenia IMPRESSION: No active disease. Electronically Signed   By: Anner Crete M.D.   On: 08/05/2020 18:27      Scheduled Meds: . aspirin EC  325 mg Oral Daily  . atorvastatin  40 mg Oral Daily  . carbidopa-levodopa  3 tablet Oral BID  . clopidogrel  75 mg Oral Daily  .  enoxaparin (LOVENOX) injection  30 mg Subcutaneous Q24H  . LORazepam  1 mg Intravenous Once  . midodrine  10 mg Oral TID WC  . pantoprazole  40 mg Oral Daily  . polyethylene glycol  17 g Oral Daily  . primidone  150 mg Oral BID  . umeclidinium bromide  1 puff Inhalation Daily  . venlafaxine  37.5 mg Oral BID  . vitamin B-12  1,000 mcg Oral Daily   Continuous Infusions: . sodium chloride 100 mL/hr at 08/07/20 0913  . cefTRIAXone (ROCEPHIN)  IV 2 g (08/06/20  1219)     LOS: 11 days      Time spent: 25 minutes   Dessa Phi, DO [facility] 08/07/2020, 10:41 AM   Available via Epic secure chat 7am-7pm After these hours, please refer to coverage provider listed on [email or web address]

## 2020-08-07 NOTE — Progress Notes (Signed)
Physical Therapy Treatment Patient Details Name: Jeffrey Blair MRN: [id number] DOB: 12-01-1927 Today's Date: 08/07/2020    History of Present Illness 87 yo male admitted to [facility] from [place] for AMS. CTA demonstrates R proximal MCA posterior division occlusion with distal reconstitution, severe stenosis of proximal basilar and P2 of R PCA. MRI Head 07/27/20 demonstrates acute to subacute infarct in the posterior right MCA territory  infarct, and especially at the right MCA/PCA watershed. Pt and family left AMA, returned. PMH includes parkinson's disease, CVA, prostate cancer, TIA, migraines, cervical disc disease, dementia.    PT Comments    Pt with some improvement in participation today, following commands to move R side. Pt does not follow commands or demonstrate any purposeful movement of L side during session. Pt continues to require significant physical assistance to mobilize at this time and remains at a very high risk for falls due to weakness and poor cognition. Pt will benefit from continued acute PT POC to reduce falls risk and caregiver burden. PT continues to recommend SNF placement.   Follow Up Recommendations  SNF;Supervision/Assistance - 24 hour;Supervision for mobility/OOB     Equipment Recommendations   (mechanical lift)    Recommendations for Other Services       Precautions / Restrictions Precautions Precautions: Fall Restrictions Weight Bearing Restrictions: No    Mobility  Bed Mobility Overal bed mobility: Needs Assistance Bed Mobility: Supine to Sit;Sit to Supine     Supine to sit: Max assist Sit to supine: Total assist      Transfers Overall transfer level: Needs assistance Equipment used: 1 person hand held assist Transfers: Sit to/from Stand Sit to Stand: Total assist;From elevated surface         General transfer comment: pt requires BUE support and L knee block  Ambulation/Gait                 Stairs              Wheelchair Mobility    Modified Rankin (Stroke Patients Only) Modified Rankin (Stroke Patients Only) Pre-Morbid Rankin Score: Moderate disability Modified Rankin: Severe disability     Balance Overall balance assessment: Needs assistance Sitting-balance support: Single extremity supported;Bilateral upper extremity supported;Feet supported Sitting balance-Leahy Scale: Zero Sitting balance - Comments: periods of totalA, pt often with anterior trunk lean, limited awareness of balance deficits     Standing balance-Leahy Scale: Zero Standing balance comment: max-totalA                            Cognition Arousal/Alertness: Awake/alert Behavior During Therapy: Restless Overall Cognitive Status: History of cognitive impairments - at baseline                                        Exercises      General Comments General comments (skin integrity, edema, etc.): pt tachy into 110-120s with mobility      Pertinent Vitals/Pain Pain Assessment: Faces Faces Pain Scale: No hurt Pain Location: generalized Pain Descriptors / Indicators: Grimacing Pain Intervention(s): Monitored during session    Home Living                      Prior Function            PT Goals (current goals can now be found in the care plan  section) Acute Rehab PT Goals Patient Stated Goal: to improve PT Goal Formulation: With patient/family Time For Goal Achievement: 08/21/20 Potential to Achieve Goals: Poor Progress towards PT goals: Not progressing toward goals - comment (limited by cognition and command following)    Frequency    Min 2X/week      PT Plan Frequency needs to be updated    Co-evaluation              AM-PAC PT "6 Clicks" Mobility   Outcome Measure  Help needed turning from your back to your side while in a flat bed without using bedrails?: A Lot Help needed moving from lying on your back to sitting on the side of a flat bed  without using bedrails?: A Lot Help needed moving to and from a bed to a chair (including a wheelchair)?: Total Help needed standing up from a chair using your arms (e.g., wheelchair or bedside chair)?: Total Help needed to walk in hospital room?: Total Help needed climbing 3-5 steps with a railing? : Total 6 Click Score: 8    End of Session   Activity Tolerance: Patient limited by fatigue;Patient limited by pain Patient left: in bed;with call bell/phone within reach;with bed alarm set;with family/visitor present Nurse Communication: Mobility status;Need for lift equipment PT Visit Diagnosis: Unsteadiness on feet (R26.81);Other abnormalities of gait and mobility (R26.89);Muscle weakness (generalized) (M62.81);Difficulty in walking, not elsewhere classified (R26.2);Other symptoms and signs involving the nervous system (R29.898)     Time: 1194-1740 PT Time Calculation (min) (ACUTE ONLY): 18 min  Charges:  $Therapeutic Activity: 8-22 mins                     Zenaida Niece, PT, DPT Acute Rehabilitation Pager: [phone]    Zenaida Niece 08/07/2020, 4:31 PM

## 2020-08-07 NOTE — Progress Notes (Signed)
Chart reviewed and patient's blood pressure appears better controlled on midodrine. This provider spoke to Dr. Maylene Roes the attending physician and she agreed, there are no further critical care needs. PCCM will sign off. Thank you for the opportunity to participate in this patient's care. Please contact if we can be of further assistance.  Johnsie Cancel, NP-C [facility] Contact / Pager information can be found on Amion  08/07/2020, 3:22 PM

## 2020-08-07 NOTE — Progress Notes (Signed)
  Speech Language Pathology Treatment: Dysphagia  Patient Details Name: Jeffrey Blair MRN: [id number] DOB: 07/10/1928 Today's Date: 08/07/2020 Time: 2202-5427 SLP Time Calculation (min) (ACUTE ONLY): 18 min  Assessment / Plan / Recommendation Clinical Impression  Pt seen for a skilled treatment with increased alertness level noted this date.  Pt's son in attendance for session.  Puree/Thin liquids via straw administered during trial with oral holding and anterior loss observed with min verbal cues provided for initiating swallow.  Education provided to pt's son re: safety with providing food/liquids to pt with min cues for swallowing efficiency paired with swallowing precautions.  No overt s/s of aspiration noted during trial of Dysphagia 1/thin liquids.  Con't Dysphagia 1/thin liquid diet; ST will continue to f/u for dysphagia tx/education while pt is in acute setting.  HPI HPI: Jeffrey Blair is a 87 y.o. male with medical history significant of prostate cancer in remission, history of other nonhemorrhagic CVA, TIA, Parkinson's disease, migraine headaches, cervical disc disease, dementia who is brought to the emergency department by his son from [place] after he was taken due to [place] secondary to acute other nonhemorrhagic CVA. MRI Head 07/27/20: Acute to subacute infarct in the posterior right MCA territory  infarct, and especially at the right MCA/PCA watershed (fetal type PCA origins)      SLP Plan  Continue with current plan of care       Recommendations  Diet recommendations: Dysphagia 1 (puree);Thin liquid Liquids provided via: Cup;Straw Medication Administration: Crushed with puree Supervision: Staff to assist with self feeding Compensations: Slow rate;Small sips/bites;Minimize environmental distractions Postural Changes and/or Swallow Maneuvers: Seated upright 90 degrees                Oral Care Recommendations: Oral care BID Follow up Recommendations: Skilled Nursing  facility SLP Visit Diagnosis: Dysphagia, oropharyngeal phase (R13.12) Plan: Continue with current plan of care                       Elvina Sidle, M.S., CCC-SLP 08/07/2020, 4:15 PM

## 2020-08-08 DIAGNOSIS — I10 Essential (primary) hypertension: Secondary | ICD-10-CM | POA: Diagnosis not present

## 2020-08-08 DIAGNOSIS — J441 Chronic obstructive pulmonary disease with (acute) exacerbation: Secondary | ICD-10-CM | POA: Diagnosis not present

## 2020-08-08 DIAGNOSIS — N179 Acute kidney failure, unspecified: Secondary | ICD-10-CM

## 2020-08-08 DIAGNOSIS — E785 Hyperlipidemia, unspecified: Secondary | ICD-10-CM | POA: Diagnosis not present

## 2020-08-08 DIAGNOSIS — D649 Anemia, unspecified: Secondary | ICD-10-CM

## 2020-08-08 DIAGNOSIS — I639 Cerebral infarction, unspecified: Secondary | ICD-10-CM | POA: Diagnosis not present

## 2020-08-08 DIAGNOSIS — G20 Parkinson's disease: Secondary | ICD-10-CM | POA: Diagnosis not present

## 2020-08-08 LAB — BASIC METABOLIC PANEL
Anion gap: 12 (ref 5–15)
BUN: 42 mg/dL — ABNORMAL HIGH (ref 8–23)
CO2: 20 mmol/L — ABNORMAL LOW (ref 22–32)
Calcium: 7.3 mg/dL — ABNORMAL LOW (ref 8.9–10.3)
Chloride: 105 mmol/L (ref 98–111)
Creatinine, Ser: 1.59 mg/dL — ABNORMAL HIGH (ref 0.61–1.24)
GFR, Estimated: 41 mL/min — ABNORMAL LOW (ref >60)
Glucose, Bld: 68 mg/dL — ABNORMAL LOW (ref 70–99)
Potassium: 3 mmol/L — ABNORMAL LOW (ref 3.5–5.1)
Sodium: 137 mmol/L (ref 135–145)

## 2020-08-08 LAB — CULTURE, BLOOD (ROUTINE X 2): Special Requests: ADEQUATE

## 2020-08-08 LAB — CBC
HCT: 32.4 % — ABNORMAL LOW (ref 39.0–52.0)
Hemoglobin: 11 g/dL — ABNORMAL LOW (ref 13.0–17.0)
MCH: 29.4 pg (ref 26.0–34.0)
MCHC: 34 g/dL (ref 30.0–36.0)
MCV: 86.6 fL (ref 80.0–100.0)
RBC: 3.74 MIL/uL — ABNORMAL LOW (ref 4.22–5.81)
RDW: 13.6 % (ref 11.5–15.5)
WBC: 15.4 10*3/uL — ABNORMAL HIGH (ref 4.0–10.5)
nRBC: 0 % (ref 0.0–0.2)

## 2020-08-08 MED ORDER — POTASSIUM CHLORIDE CRYS ER 20 MEQ PO TBCR
20.0000 meq | EXTENDED_RELEASE_TABLET | Freq: Two times a day (BID) | ORAL | Status: DC
  Filled 2020-08-08: qty 1

## 2020-08-08 MED ORDER — POTASSIUM CHLORIDE 10 MEQ/100ML IV SOLN
10.0000 meq | INTRAVENOUS | Status: AC
  Administered 2020-08-08 (×5): 10 meq via INTRAVENOUS
  Filled 2020-08-08 (×5): qty 100

## 2020-08-08 MED ORDER — ENOXAPARIN SODIUM 40 MG/0.4ML ~~LOC~~ SOLN
40.0000 mg | SUBCUTANEOUS | Status: DC
  Administered 2020-08-08 – 2020-08-10 (×3): 40 mg via SUBCUTANEOUS
  Filled 2020-08-08 (×3): qty 0.4

## 2020-08-08 MED ORDER — POTASSIUM CHLORIDE IN NACL 40-0.9 MEQ/L-% IV SOLN
INTRAVENOUS | Status: DC
  Filled 2020-08-08 (×6): qty 1000

## 2020-08-08 MED ORDER — CEFAZOLIN SODIUM-DEXTROSE 1-4 GM/50ML-% IV SOLN
1.0000 g | Freq: Three times a day (TID) | INTRAVENOUS | Status: DC
  Administered 2020-08-08 – 2020-08-09 (×3): 1 g via INTRAVENOUS
  Filled 2020-08-08 (×4): qty 50

## 2020-08-08 NOTE — Progress Notes (Signed)
Nurse attempted to give medication to pt. Pt spit medication out. MD aware  Gwendolyn Grant, RN

## 2020-08-08 NOTE — Progress Notes (Signed)
Occupational Therapy Treatment Patient Details Name: Jeffrey Blair MRN: [id number] DOB: 1927-11-16 Today's Date: 08/08/2020    History of present illness 87 yo male admitted to [facility] from [place] for [place]. CTA demonstrates R proximal MCA posterior division occlusion with distal reconstitution, severe stenosis of proximal basilar and P2 of R PCA. MRI Head 07/27/20 demonstrates acute to subacute infarct in the posterior right MCA territory  infarct, and especially at the right MCA/PCA watershed. Pt and family left AMA, returned. PMH includes parkinson's disease, CVA, prostate cancer, TIA, migraines, cervical disc disease, dementia.   OT comments  Pt. Seen for skilled OT treatment.  Focus of session was completion of grooming task bed level.  Pt. Max/total a for washing face.  Minimal initiation and total a for completion.  C/o HA throughout session.  RN notified and came to room at end of session.    Follow Up Recommendations  SNF;Other (comment)    Equipment Recommendations  3 in 1 bedside commode;Wheelchair (measurements OT);Wheelchair cushion (measurements OT)    Recommendations for Other Services      Precautions / Restrictions Precautions Precautions: Fall Restrictions Weight Bearing Restrictions: No       Mobility Bed Mobility                  Transfers                      Balance                                           ADL either performed or assessed with clinical judgement   ADL Overall ADL's : Needs assistance/impaired     Grooming: Wash/dry face;Maximal assistance;Bed level;Total assistance Grooming Details (indicate cue type and reason): Pt requires bil Ue to wash face                               General ADL Comments: hand over hand assistance required for completion of task. intially brought to face then dropped the wash cloth and did not continue with multiple attempts without assistance      Vision       Perception     Praxis      Cognition Arousal/Alertness: Awake/alert   Overall Cognitive Status: History of cognitive impairments - at baseline                                          Exercises     Shoulder Instructions       General Comments      Pertinent Vitals/ Pain       Pain Assessment: Faces Faces Pain Scale: Hurts whole lot Pain Location: "my head hurts" (rubbing and pointing to head constantly) Pain Descriptors / Indicators: Headache Pain Intervention(s): Limited activity within patient's tolerance;Monitored during session;Patient requesting pain meds-RN notified;Other (comment) (rn present at end of session, reviewed with her his c/o severe HA)  Home Living                                          Prior Functioning/Environment  Frequency  Min 2X/week        Progress Toward Goals  OT Goals(current goals can now be found in the care plan section)  Progress towards OT goals: Progressing toward goals     Plan      Co-evaluation                 AM-PAC OT "6 Clicks" Daily Activity     Outcome Measure   Help from another person eating meals?: A Lot Help from another person taking care of personal grooming?: A Lot Help from another person toileting, which includes using toliet, bedpan, or urinal?: A Lot Help from another person bathing (including washing, rinsing, drying)?: A Lot Help from another person to put on and taking off regular upper body clothing?: A Lot Help from another person to put on and taking off regular lower body clothing?: A Lot 6 Click Score: 12    End of Session    OT Visit Diagnosis: Unsteadiness on feet (R26.81);Muscle weakness (generalized) (M62.81)   Activity Tolerance Patient limited by pain   Patient Left in bed;with call bell/phone within reach;with bed alarm set   Nurse Communication Other (comment) (spoke to rn regarding pt. c/o HA, also  that he was requesting water.  followed inst. at hob from [place] and pt did take a sip and swallow it)        Time: 4235-3614 OT Time Calculation (min): 9 min  Charges: OT General Charges $OT Visit: 1 Visit OT Treatments $Self Care/Home Management : 8-22 mins  Sonia Baller, COTA/L Acute Rehabilitation [phone]   Jeffrey Blair 08/08/2020, 11:30 AM

## 2020-08-08 NOTE — Progress Notes (Addendum)
Pt has been on 2 days of ceftriaxone for his e.coli bacteremia. It came back pan sensitive except for ciprofloxacin. D/w Dr. Rockne Menghini and we will optimize his abx to cefazolin for another 5d to complete 7d of therapy. He has a hx of PCN allergy and has been tolerating ceftriaxone without any issues. Scr is improving.   Scr down to 1.59 CrCl~32 ml/min  Dc ceftriaxone Cefazolin 1g IV q8 x 5days  Onnie Boer, PharmD, [place], AAHIVP, CPP Infectious Disease Pharmacist 08/08/2020 10:58 AM   Adjusting lovenox to 40mg  SQ qday due to CrCl >30 ml/min.  Onnie Boer, PharmD, BCIDP, AAHIVP, CPP Infectious Disease Pharmacist 08/08/2020 2:23 PM

## 2020-08-08 NOTE — Progress Notes (Signed)
Daily Progress Note   Patient Name: Jeffrey Blair       Date: 08/08/2020 DOB: 1928-07-10  Age: 87 y.o. MRN#: [id number] Attending Physician: Tonye Royalty, MD Primary Care Physician: [facility] Admit Date: 07/25/2020  Reason for Consultation/Follow-up: Establishing goals of care  Chart reviewed and updates received by RN.   Jeffrey Blair continues to yell out in an uncomfortable state. Per reports refusing medications, spitting them out, not taking in much oral nutrition, only water at times. Sons have been at the bedside and observed behaviors however, continues to remain hopeful with comparison to patient's cooperation on previous occassions.   Family continues to make request for medical management and attempts for patient to take in medication.   Jeffrey Blair has acknowledge in previous discussion his concerns for his father's outburst of "Lord help me!" and expressed he does not wish for him to be in suffrage but also family does not want him medicated to the point his ability to respond or show response to treatment be hindered.   Jeffrey Blair prognosis remains poor and on-going family discussions have been encouraged with emphasis on patient's quality of life and centering all decisions around what is best and most important for Jeffrey Blair.   Family continue to remain hopeful and for watchful waiting.   Length of Stay: 12 days  Vital Signs: BP 115/68 (BP Location: Left Arm)   Pulse 98   Temp 98.3 F (36.8 C) (Oral)   Resp 20   Ht 6\' 1"  (1.854 m)   Wt 72.6 kg   SpO2 96%   BMI 21.11 kg/m  SpO2: SpO2: 96 % O2 Device: O2 Device: Room Air O2 Flow Rate: O2 Flow Rate (L/min): 2 L/min  Palliative Care Assessment & Plan  HPI: Palliative Care consult requested for goals of care discussion in this 87 y.o. male with multiple medical problems including prostate cancer in remission, history of other nonhemorrhagic CVA, TIA, Parkinson's disease, migraine headaches,  cervical disc disease, and dementia. Patient presented to [facility] after presenting to the ED at [facility] with concerns of AMS and found to have an acute nonhemorrhagic CVA. Per notation family reports patient was in a normal state of health on 07/24/20. On 1/5 patient had episode of tachycardia, hypotension, with severe left flaccid hemiparesis. Head CT showed new subtle hypodensities adjacent to the evolving subacute right MCA.   Code Status:  DNR  Goals of Care/Recommendations:  Continue to treat the treatable.  Family continues to request watchful waiting and attempts to be made to allow patient an opportunity to respond. Despite discussions regarding poor prognosis and observations of failure to respond to oral intake/medications.   Patient may require artificial feedings if family continues to request aggressive interventions, however this will not provide any meaningful recovery for Jeffrey Blair given multiple co-morbidities in addition to severity of recent CVA. He would most likely pull any tubing out without some form of restraints. Would most benefit from comfort care measures allowing symptom management and comfort during what time he has left.   PMT will continue to support and follow.   Prognosis: POOR   Discharge Planning: To Be Determined  Thank you for allowing the Palliative Medicine Team to assist in the care of this patient.  Time Total: 20 min.   Visit consisted of counseling and education dealing with the complex and emotionally intense issues of symptom management and palliative care in the setting of serious and potentially life-threatening illness.Greater than 50%  of this time was spent counseling and coordinating care related to the above assessment and plan.  Alda Lea, AGPCNP-BC  Palliative Medicine Team [phone]

## 2020-08-08 NOTE — Progress Notes (Signed)
Progress Note    Jeffrey Blair  [id number] DOB: 1927-12-09  DOA: 07/25/2020 PCP: [facility]    Brief Narrative:   Chief complaint: Mobility impairment  Medical records reviewed and are as summarized below:  Jeffrey Blair is an 87 y.o. male with PMH Parkinson's disease, dementia, debility, hx cerebellar CVA,cervical DDD who was admitted 07/25/20 for worsening balance/mobility, worsening confusion, and dysarthria. He was initially taken to [facility] ER, but patient got agitated with a very long wait time, signed out AMA by family, then [facility] MRI showed an acute widespread infarct involving approximately a 7 cm area of the right MCA/PCA territory,in addition to chronic infarcts. He failed a swallow eval performed on 07/27/2020 with speech therapy, later started on dysphagia 1 diet.   Overnight 1/5-1/6, patient had episode of tachycardia, hypotension had severe left flaccid hemiparesis and underwent repeat head CT which revealed new subtle hypodensities adjacent to the evolving subacute right MCA distribution infarct concerning for new acute component/interval expansion. Blood culture resulted with E Coli and patient was started on IV antibiotics, continued with IVF.   Assessment/Plan:   Principle problem: Acute right MCA CVA -Appreciate neurology, recommended for aspirin 325 mg daily, Plavix 75 mg daily for 3 months followed by aspirin daily. -Recommended for 30-day event monitor as an outpatient but unclear if patient would be able to comply/quality of recording. -Continue Lipitor. -Repeat head CT 1/6 revealed suspected new subtle hypodensities adjacent to the evolving subacute right MCA distribution infarct as above, concerning for new acute component/interval expansion. -Recommended to optimize blood pressure and to keep MAP >80. -PT OT recommended SNF, however insurance auth  denied. Disposition pending patient's clinical condition. Seems too debilitated to safely d/c home.   Active problems: Severe sepsis secondary to E. coli bacteremia, sepsis not present on admission -Fever with T-max 101 on 08/05/20 along with tachycardia with heart rate 130s, tachypnea with respiratory rate 20 and hypotension with SBP 70-80s.  -Blood culture obtained 08/05/20 now showing E. coli, susceptibility noted and antibiotics narrowed to Ancef. -Urine cultures with multiple species of bacteria. -Midodrine ordered to assist with BP. -Continue IVF.  Dysphagia -On dysphagia 1 (pureed) diet but not handling oral intake well per RN.  Hypokalemia K runs x 5.  Add KCL to IVF.   AKI -Continue IVF and avoid nephrotoxins. Renal US unremarkable.  Persistent headache -Repeat CT head showed interval evolution of subacute right MCA infarct without hemorrhage or mass affect -Symptom management.  History of Parkinson's disease, dementia -Continue Sinemet, primidone.  Hypertension -Long-term blood pressure goal 130-150 given severe intracranial stenosis.  Depression/anxiety -Continue Effexor.  Nutritional status        Body mass index is 21.11 kg/m.   Family Communication/Anticipated D/C date and plan/Code Status   DVT prophylaxis: enoxaparin (LOVENOX) injection 30 mg Start: 08/06/20 1800 SCDs Start: 07/26/20 0449   Code Status: DNR  Family Communication: No family at the bedside. Disposition Plan: Status is: Inpatient  Remains inpatient appropriate because:Inpatient level of care appropriate due to severity of illness.  Poor oral intake, unsafe d/c plan.   Dispo: The patient is from: Home              Anticipated d/c is to: Insurance denied SNF, likely needs LTC.              Anticipated d/c date is: 3 days  Patient currently is not medically stable to d/c.   Medical Consultants:    Palliative Care  PCCM   Anti-Infectives:     Ancef  Subjective:   Sitting up with RN trying to get him to take medications, but pockets them and doesn't swallow.  Keeps saying "Lord help me", but able to follow commands some.  Unable to perform ROS as patient will not focus to answer any questions about symptoms.   Objective:    Vitals:   08/07/20 2126 08/07/20 2326 08/08/20 0326 08/08/20 0735  BP: 138/72 111/74 103/63 (!) 103/58  Pulse: (!) 150 (!) 144 (!) 128 (!) 108  Resp: 20 20 20  (!) 21  Temp: 98.8 F (37.1 C) 99.1 F (37.3 C) 98.4 F (36.9 C) 98.3 F (36.8 C)  TempSrc: Axillary Axillary Axillary Oral  SpO2: 99% 100% 97% 100%  Weight:      Height:       No intake or output data in the 24 hours ending 08/08/20 0814 Filed Weights   07/25/20 1925  Weight: 72.6 kg    Exam: General: Uncomfortable appearing. Keeps perseverating, saying "Lord help me." Cardiovascular: Heart sounds show a tachycardic rate, and regular rhythm. No gallops or rubs. No murmurs. No JVD. Lungs: Diminished breath sounds. No rales, rhonchi or wheezes. Abdomen: Soft, nontender, nondistended with normal active bowel sounds. No masses. No hepatosplenomegaly. Neurological: Alert and oriented 2 (self/hospital).  Dense left hemiparesis.  Confused.  Follows limited commands. Skin: Warm and dry. No rashes or lesions. Hematoma left shin. Extremities: No clubbing or cyanosis. No edema. Pedal pulses 2+. Psychiatric: Mood and affect are flat. Insight and judgment are impaired.   Data Reviewed:   I have personally reviewed following labs and imaging studies:  Labs: Labs show the following:   Basic Metabolic Panel: Recent Labs  Lab 08/04/20 0605 08/06/20 1143 08/07/20 0203 08/08/20 0425  NA 133* 136 135 137  K 3.9 4.0 4.2 3.0*  CL 96* 102 102 105  CO2 25 19* 22 20*  GLUCOSE 80 76 75 68*  BUN 22 30* 39* 42*  CREATININE 0.81 1.91* 2.13* 1.59*  CALCIUM 8.9 8.1* 7.5* 7.3*   GFR Estimated Creatinine Clearance: 31.7 mL/min (A) (by C-G  formula based on SCr of 1.59 mg/dL (H)).  CBC: Recent Labs  Lab 08/04/20 0605 08/06/20 1143 08/07/20 0203 08/08/20 0425  WBC 4.3 22.4* 12.0* 15.4*  HGB 14.2 11.7* 11.6* 11.0*  HCT 41.0 35.4* 34.7* 32.4*  MCV 86.5 91.5 87.4 86.6  PLT 189 137* 122*  --    CBG: Recent Labs  Lab 08/05/20 2338  GLUCAP 108*   Sepsis Labs: Recent Labs  Lab 08/04/20 0605 08/06/20 1143 08/06/20 1337 08/07/20 0203 08/07/20 1224 08/07/20 1526 08/08/20 0425  WBC 4.3 22.4*  --  12.0*  --   --  15.4*  LATICACIDVEN  --  4.8* 5.1*  --  1.8 1.8  --     Microbiology Recent Results (from the past 240 hour(s))  Resp Panel by RT-PCR (Flu A&B, Covid) Nasopharyngeal Swab     Status: None   Collection Time: 08/04/20  2:07 PM   Specimen: Nasopharyngeal Swab; Nasopharyngeal(NP) swabs in vial transport medium  Result Value Ref Range Status   SARS Coronavirus 2 by RT PCR NEGATIVE NEGATIVE Final    Comment: (NOTE) SARS-CoV-2 target nucleic acids are NOT DETECTED.  The SARS-CoV-2 RNA is generally detectable in upper respiratory specimens during the acute phase of infection. The lowest concentration of SARS-CoV-2 viral copies this  assay can detect is 138 copies/mL. A negative result does not preclude SARS-Cov-2 infection and should not be used as the sole basis for treatment or other patient management decisions. A negative result may occur with  improper specimen collection/handling, submission of specimen other than nasopharyngeal swab, presence of viral mutation(s) within the areas targeted by this assay, and inadequate number of viral copies(<138 copies/mL). A negative result must be combined with clinical observations, patient history, and epidemiological information. The expected result is Negative.  Fact Sheet for Patients:  EntrepreneurPulse.com.au  Fact Sheet for Healthcare Providers:  IncredibleEmployment.be  This test is no t yet approved or cleared by  the [place] FDA and  has been authorized for detection and/or diagnosis of SARS-CoV-2 by FDA under an Emergency Use Authorization (EUA). This EUA will remain  in effect (meaning this test can be used) for the duration of the COVID-19 declaration under Section 564(b)(1) of the Act, 21 U.S.C.section 360bbb-3(b)(1), unless the authorization is terminated  or revoked sooner.       Influenza A by PCR NEGATIVE NEGATIVE Final   Influenza B by PCR NEGATIVE NEGATIVE Final    Comment: (NOTE) The Xpert Xpress SARS-CoV-2/FLU/RSV plus assay is intended as an aid in the diagnosis of influenza from Nasopharyngeal swab specimens and should not be used as a sole basis for treatment. Nasal washings and aspirates are unacceptable for Xpert Xpress SARS-CoV-2/FLU/RSV testing.  Fact Sheet for Patients: EntrepreneurPulse.com.au  Fact Sheet for Healthcare Providers: IncredibleEmployment.be  This test is not yet approved or cleared by the [place] FDA and has been authorized for detection and/or diagnosis of SARS-CoV-2 by FDA under an Emergency Use Authorization (EUA). This EUA will remain in effect (meaning this test can be used) for the duration of the COVID-19 declaration under Section 564(b)(1) of the Act, 21 U.S.C. section 360bbb-3(b)(1), unless the authorization is terminated or revoked.  Performed at [facility], [place] [address], [place], [place] [address]   Culture, blood (routine x 2)     Status: None (Preliminary result)   Collection Time: 08/05/20  9:31 PM   Specimen: BLOOD  Result Value Ref Range Status   Specimen Description BLOOD SITE NOT SPECIFIED  Final   Special Requests   Final    BOTTLES DRAWN AEROBIC ONLY Blood Culture results may not be optimal due to an inadequate volume of blood received in culture bottles   Culture  Setup Time   Final    GRAM NEGATIVE RODS AEROBIC BOTTLE ONLY CRITICAL VALUE NOTED.  VALUE IS CONSISTENT  WITH PREVIOUSLY REPORTED AND CALLED VALUE. Performed at [facility], [place] [address] [place], [place] [address]    Culture GRAM NEGATIVE RODS  Final   Report Status PENDING  Incomplete  Culture, blood (routine x 2)     Status: Abnormal (Preliminary result)   Collection Time: 08/05/20  9:31 PM   Specimen: BLOOD  Result Value Ref Range Status   Specimen Description BLOOD SITE NOT SPECIFIED  Final   Special Requests   Final    BOTTLES DRAWN AEROBIC ONLY Blood Culture adequate volume   Culture  Setup Time   Final    GRAM NEGATIVE RODS AEROBIC BOTTLE ONLY Organism ID to follow CRITICAL RESULT CALLED TO, READ BACK BY AND VERIFIED WITH: A. Rogers Blocker PharmD 10:10 08/06/20 ([email or web address])    Culture (A)  Final    ESCHERICHIA COLI SUSCEPTIBILITIES TO FOLLOW Performed at [facility], [place] [address], [place],  [address]  Report Status PENDING  Incomplete  Blood Culture ID Panel (Reflexed)     Status: Abnormal   Collection Time: 08/05/20  9:31 PM  Result Value Ref Range Status   Enterococcus faecalis NOT DETECTED NOT DETECTED Final   Enterococcus Faecium NOT DETECTED NOT DETECTED Final   Listeria monocytogenes NOT DETECTED NOT DETECTED Final   Staphylococcus species NOT DETECTED NOT DETECTED Final   Staphylococcus aureus (BCID) NOT DETECTED NOT DETECTED Final   Staphylococcus epidermidis NOT DETECTED NOT DETECTED Final   Staphylococcus lugdunensis NOT DETECTED NOT DETECTED Final   Streptococcus species NOT DETECTED NOT DETECTED Final   Streptococcus agalactiae NOT DETECTED NOT DETECTED Final   Streptococcus pneumoniae NOT DETECTED NOT DETECTED Final   Streptococcus pyogenes NOT DETECTED NOT DETECTED Final   A.calcoaceticus-baumannii NOT DETECTED NOT DETECTED Final   Bacteroides fragilis NOT DETECTED NOT DETECTED Final   Enterobacterales DETECTED (A) NOT DETECTED Final    Comment: Enterobacterales represent a large order of gram negative bacteria, not a single  organism. CRITICAL RESULT CALLED TO, READ BACK BY AND VERIFIED WITH: A. Rogers Blocker PharmD 10:10 08/06/20 ([email or web address])    Enterobacter cloacae complex NOT DETECTED NOT DETECTED Final   Escherichia coli DETECTED (A) NOT DETECTED Final    Comment: CRITICAL RESULT CALLED TO, READ BACK BY AND VERIFIED WITH: A. Rogers Blocker PharmD 10:10 08/06/20 ([email or web address])    Klebsiella aerogenes NOT DETECTED NOT DETECTED Final   Klebsiella oxytoca NOT DETECTED NOT DETECTED Final   Klebsiella pneumoniae NOT DETECTED NOT DETECTED Final   Proteus species NOT DETECTED NOT DETECTED Final   Salmonella species NOT DETECTED NOT DETECTED Final   Serratia marcescens NOT DETECTED NOT DETECTED Final   Haemophilus influenzae NOT DETECTED NOT DETECTED Final   Neisseria meningitidis NOT DETECTED NOT DETECTED Final   Pseudomonas aeruginosa NOT DETECTED NOT DETECTED Final   Stenotrophomonas maltophilia NOT DETECTED NOT DETECTED Final   Candida albicans NOT DETECTED NOT DETECTED Final   Candida auris NOT DETECTED NOT DETECTED Final   Candida glabrata NOT DETECTED NOT DETECTED Final   Candida krusei NOT DETECTED NOT DETECTED Final   Candida parapsilosis NOT DETECTED NOT DETECTED Final   Candida tropicalis NOT DETECTED NOT DETECTED Final   Cryptococcus neoformans/gattii NOT DETECTED NOT DETECTED Final   CTX-M ESBL NOT DETECTED NOT DETECTED Final   Carbapenem resistance IMP NOT DETECTED NOT DETECTED Final   Carbapenem resistance KPC NOT DETECTED NOT DETECTED Final   Carbapenem resistance NDM NOT DETECTED NOT DETECTED Final   Carbapenem resist OXA 48 LIKE NOT DETECTED NOT DETECTED Final   Carbapenem resistance VIM NOT DETECTED NOT DETECTED Final    Comment: Performed at [facility], [address], [place], [place] [address]  Culture, Urine     Status: Abnormal   Collection Time: 08/06/20  1:14 PM   Specimen: Urine, Random  Result Value Ref Range Status   Specimen Description URINE, RANDOM  Final   Special Requests   Final     NONE Performed at [facility], [place] [address], [place], [place] [address]    Culture MULTIPLE SPECIES PRESENT, SUGGEST RECOLLECTION (A)  Final   Report Status Aug 08, 2020 FINAL  Final    Procedures and diagnostic studies:  US RENAL  Result Date: 2020-08-08 CLINICAL DATA:  Acute renal injury. EXAM: RENAL / URINARY TRACT ULTRASOUND COMPLETE COMPARISON:  CT scan 05/27/2015 FINDINGS: Right Kidney: Renal measurements: 9.4 x 4.1 x 5.8 cm = volume: 115.9 mL. Normal renal cortical thickness and echogenicity for age. No worrisome  renal lesions or hydronephrosis. Left Kidney: Renal measurements: 9.1 x 4.7 x 4.9 cm = volume: 116.6 mL. Normal renal cortical thickness and echogenicity for age. Simple 2 cm cyst, unchanged since prior CT scan. No worrisome renal lesions or hydronephrosis. Bladder: Normal Other: None. IMPRESSION: Unremarkable renal ultrasound examination. Electronically Signed   By: Marijo Sanes M.D.   On: 08/07/2020 11:53    Medications:   . aspirin EC  325 mg Oral Daily  . atorvastatin  40 mg Oral Daily  . carbidopa-levodopa  3 tablet Oral BID  . clopidogrel  75 mg Oral Daily  . enoxaparin (LOVENOX) injection  30 mg Subcutaneous Q24H  . LORazepam  1 mg Intravenous Once  . midodrine  10 mg Oral TID WC  . pantoprazole  40 mg Oral Daily  . polyethylene glycol  17 g Oral Daily  . primidone  150 mg Oral BID  . umeclidinium bromide  1 puff Inhalation Daily  . venlafaxine  37.5 mg Oral BID  . vitamin B-12  1,000 mcg Oral Daily   Continuous Infusions: . sodium chloride 100 mL/hr at 08/07/20 0913  . cefTRIAXone (ROCEPHIN)  IV 2 g (08/07/20 1212)     LOS: 12 days   Jacquelynn Cree, MD  [facility]   [facility] How to contact the [facility] Attending or Consulting provider [place] or covering provider during after hours [place], for this patient?  1. Check the care team in [facility] and look for a) attending/consulting [facility] provider listed and b) the [facility] team listed 2. Log  into [email or web address] and use [place]'s universal password to access. If you do not have the password, please contact the hospital operator. 3. Locate the [facility] provider you are looking for under [facility] and page to a number that you can be directly reached. 4. If you still have difficulty reaching the provider, please page the [facility] (Director on Call) for the Hospitalists listed on amion for assistance.  08/08/2020, 8:14 AM

## 2020-08-09 DIAGNOSIS — Z515 Encounter for palliative care: Secondary | ICD-10-CM | POA: Diagnosis not present

## 2020-08-09 DIAGNOSIS — E46 Unspecified protein-calorie malnutrition: Secondary | ICD-10-CM | POA: Diagnosis present

## 2020-08-09 DIAGNOSIS — I10 Essential (primary) hypertension: Secondary | ICD-10-CM | POA: Diagnosis not present

## 2020-08-09 DIAGNOSIS — N179 Acute kidney failure, unspecified: Secondary | ICD-10-CM | POA: Diagnosis not present

## 2020-08-09 DIAGNOSIS — Z66 Do not resuscitate: Secondary | ICD-10-CM | POA: Diagnosis not present

## 2020-08-09 DIAGNOSIS — E440 Moderate protein-calorie malnutrition: Secondary | ICD-10-CM

## 2020-08-09 DIAGNOSIS — G20 Parkinson's disease: Secondary | ICD-10-CM | POA: Diagnosis not present

## 2020-08-09 DIAGNOSIS — R636 Underweight: Secondary | ICD-10-CM

## 2020-08-09 DIAGNOSIS — Z7189 Other specified counseling: Secondary | ICD-10-CM | POA: Diagnosis not present

## 2020-08-09 DIAGNOSIS — I639 Cerebral infarction, unspecified: Secondary | ICD-10-CM | POA: Diagnosis not present

## 2020-08-09 DIAGNOSIS — E785 Hyperlipidemia, unspecified: Secondary | ICD-10-CM | POA: Diagnosis not present

## 2020-08-09 LAB — BASIC METABOLIC PANEL
Anion gap: 13 (ref 5–15)
BUN: 32 mg/dL — ABNORMAL HIGH (ref 8–23)
CO2: 16 mmol/L — ABNORMAL LOW (ref 22–32)
Calcium: 7.2 mg/dL — ABNORMAL LOW (ref 8.9–10.3)
Chloride: 104 mmol/L (ref 98–111)
Creatinine, Ser: 1.07 mg/dL (ref 0.61–1.24)
GFR, Estimated: >60 mL/min (ref >60)
Glucose, Bld: 74 mg/dL (ref 70–99)
Potassium: 4.3 mmol/L (ref 3.5–5.1)
Sodium: 133 mmol/L — ABNORMAL LOW (ref 135–145)

## 2020-08-09 MED ORDER — ASPIRIN 325 MG PO TABS
325.0000 mg | ORAL_TABLET | Freq: Every day | ORAL | Status: DC
  Administered 2020-08-09: 325 mg via ORAL
  Filled 2020-08-09: qty 1

## 2020-08-09 MED ORDER — ENSURE ENLIVE PO LIQD
237.0000 mL | Freq: Three times a day (TID) | ORAL | Status: DC
  Administered 2020-08-09 – 2020-08-21 (×8): 237 mL via ORAL

## 2020-08-09 MED ORDER — PANTOPRAZOLE SODIUM 40 MG PO PACK
40.0000 mg | PACK | Freq: Every day | ORAL | Status: DC
  Administered 2020-08-09: 40 mg
  Filled 2020-08-09: qty 20

## 2020-08-09 MED ORDER — CEFAZOLIN SODIUM-DEXTROSE 2-4 GM/100ML-% IV SOLN
2.0000 g | Freq: Three times a day (TID) | INTRAVENOUS | Status: AC
  Administered 2020-08-09 – 2020-08-12 (×10): 2 g via INTRAVENOUS
  Filled 2020-08-09 (×13): qty 100

## 2020-08-09 NOTE — Progress Notes (Signed)
Progress Note    Jeffrey Blair  ONG:[id number] DOB: 01/19/1928  DOA: 07/25/2020 PCP: [facility], [facility]    Brief Narrative:   Chief complaint: Mobility impairment due to stroke  Medical records reviewed and are as summarized below:  Jeffrey Blair is an 87 y.o. male with PMH Parkinson's disease, dementia, debility, hx cerebellar CVA,cervical DDD who was admitted 07/25/20 for worsening balance/mobility, worsening confusion, and dysarthria. He was initially taken to [facility] ER, but patient got agitated with a very long wait time, signed out AMA by family, then [facility] MRI showed an acute widespread infarct involving approximately a 7 cm area of the right MCA/PCA territory,in addition to chronic infarcts. He failed a swallow eval performed on 07/27/2020 with speech therapy, later started on dysphagia 1 diet.   Overnight 1/5-1/6, patient had episode of tachycardia, hypotension had severe left flaccid hemiparesis and underwent repeat head CT which revealed new subtle hypodensities adjacent to the evolving subacute right MCA distribution infarct concerning for new acute component/interval expansion. Blood culture resulted with E Coli and patient was started on IV antibiotics, continued with IVF.   Assessment/Plan:   Principle problem: Acute right MCA CVA -Appreciate neurology, recommended for aspirin 325 mg daily, Plavix 75 mg daily for 3 months followed by aspirin daily. -Recommended for 30-day event monitor as an outpatient but unclear if patient would be able to comply/quality of recording. -Continue Lipitor. -Repeat head CT 08/06/20 revealed suspected new subtle hypodensities adjacent to the evolving subacute right MCA distribution infarct as above, concerning for new acute component/interval expansion. -Recommended to optimize blood pressure and to keep MAP >80. -PT OT recommended SNF, however  insurance auth denied. Disposition pending patient's clinical condition. -Seems too debilitated to safely d/c home. Evaluated by palliative care.  Poor prognosis but family not ready to move to comfort care.  May need feeding tube placement if ongoing aggressive care wanted. Will ask dietician to maximize nutrition/perform calorie count.  Active problems: Severe sepsis secondary to E. coli bacteremia, sepsis not present on admission -Fever with T-max 101 on 08/05/20 along with tachycardia with heart rate 130s, tachypnea with respiratory rate 20 and hypotension with SBP 70-80s.  -Blood culture obtained 08/05/20 now showing E. coli, susceptibility noted and antibiotics narrowed to Ancef. -Urine cultures with multiple species of bacteria. -Midodrine ordered to assist with BP. -Continue IVF.  Hyponatremia -Likely from polydipsia.  May be psychogenic.   Metabolic acidosis -Renal function improving.  Leukocytosis -Afebrile. High risk for aspiration.  Dysphagia -On dysphagia 1 (pureed) diet but not handling oral intake well per RN.  Hypokalemia -Corrected.  KCL added to IVF.  AKI -Continue IVF and avoid nephrotoxins. Renal US unremarkable. -Creatinine normalizing on IVF.  Persistent headache -Repeat CT head showed interval evolution of subacute right MCA infarct without hemorrhage or mass effect. -Symptom management.  History of Parkinson's disease, dementia -Continue Sinemet, primidone.  Hypertension -Long-term blood pressure goal 130-150 given severe intracranial stenosis.  Depression/anxiety -Continue Effexor.  Nutritional status: Protein calorie malnutrition with poor PO intake, at risk for severe malnutrition  Body mass index is 21.11 kg/m. -Will ask dietician to evaluate to perform calorie count/make recommendations. -Son reports he was eating 1/2 meals TID prior to stroke, and has not had any significant PO intake since. -Underweight with loss of fat/muscle stores on  exam. -Ensure TID ordered.  Family Communication/Anticipated D/C date and plan/Code Status   DVT prophylaxis: enoxaparin (LOVENOX) injection  40 mg Start: 08/08/20 1800 SCDs Start: 07/26/20 0449   Code Status: DNR  Family Communication: No family at the bedside. Son updated by telephone. Disposition Plan: Status is: Inpatient  Remains inpatient appropriate because:Inpatient level of care appropriate due to severity of illness.  Poor oral intake, unsafe d/c plan.   Dispo: The patient is from: Home              Anticipated d/c is to: Insurance denied SNF, likely needs LTC.              Anticipated d/c date is: 3 days              Patient currently is not medically stable to d/c.   Medical Consultants:    Palliative Care  PCCM   Anti-Infectives:    Ancef  Subjective:   Agitated this morning, all askew in the bed.  Kept saying: "I need some water.  I need some medicine real quick.  I'm gonna die." Given multiple sips of water without change in behavior. Unable to articulate what was causing him discomfort.   Objective:    Vitals:   08/08/20 1607 08/08/20 1929 08/09/20 0028 08/09/20 0446  BP: 115/68 115/69 122/76 134/79  Pulse: 98 (!) 103 96 93  Resp: 20 20 18 18   Temp: 98.3 F (36.8 C) 98.7 F (37.1 C) 99 F (37.2 C) 98.7 F (37.1 C)  TempSrc: Oral Oral Oral Oral  SpO2: 96% 98% 100% 100%  Weight:      Height:        Intake/Output Summary (Last 24 hours) at 08/09/2020 0734 Last data filed at 08/09/2020 0446 Gross per 24 hour  Intake -  Output 800 ml  Net -800 ml   Filed Weights   07/25/20 1925  Weight: 72.6 kg    Exam: General: Uncomfortable appearing.Restless, repeatedly saying "I need some water.  I need some medicine real quick.  I'm gonna die." Cardiovascular: Heart sounds show a tachycardic rate, and regular rhythm. No gallops or rubs. No murmurs. No JVD. Unchanged. Lungs: Diminished breath sounds. No rales, rhonchi or wheezes. Unchanged. Abdomen:  Soft, nontender, nondistended with normal active bowel sounds. No masses. No hepatosplenomegaly. Unchanged. Neurological: Agitated. Unable to follow commands.  Dense left hemiparesis.  Confused.   Skin: Warm and dry. No rashes or lesions. Hematoma left shin. Unchanged. Extremities: No clubbing or cyanosis. No edema. Pedal pulses 2+. Psychiatric: Mood and affect are anxious. Insight and judgment are impaired.   Data Reviewed:   I have personally reviewed following labs and imaging studies:  Labs: Labs show the following:   Basic Metabolic Panel: Recent Labs  Lab 08/04/20 0605 08/06/20 1143 08/07/20 0203 08/08/20 0425 08/09/20 0228  NA 133* 136 135 137 133*  K 3.9 4.0 4.2 3.0* 4.3  CL 96* 102 102 105 104  CO2 25 19* 22 20* 16*  GLUCOSE 80 76 75 68* 74  BUN 22 30* 39* 42* 32*  CREATININE 0.81 1.91* 2.13* 1.59* 1.07  CALCIUM 8.9 8.1* 7.5* 7.3* 7.2*   GFR Estimated Creatinine Clearance: 47.1 mL/min (by C-G formula based on SCr of 1.07 mg/dL).  CBC: Recent Labs  Lab 08/04/20 0605 08/06/20 1143 08/07/20 0203 08/08/20 0425  WBC 4.3 22.4* 12.0* 15.4*  HGB 14.2 11.7* 11.6* 11.0*  HCT 41.0 35.4* 34.7* 32.4*  MCV 86.5 91.5 87.4 86.6  PLT 189 137* 122*  --    CBG: Recent Labs  Lab 08/05/20 2338  GLUCAP 108*   Sepsis Labs:  Recent Labs  Lab 08/04/20 0605 08/06/20 1143 08/06/20 1337 08/07/20 0203 08/07/20 1224 08/07/20 1526 08/08/20 0425  WBC 4.3 22.4*  --  12.0*  --   --  15.4*  LATICACIDVEN  --  4.8* 5.1*  --  1.8 1.8  --     Microbiology Recent Results (from the past 240 hour(s))  Resp Panel by RT-PCR (Flu A&B, Covid) Nasopharyngeal Swab     Status: None   Collection Time: 08/04/20  2:07 PM   Specimen: Nasopharyngeal Swab; Nasopharyngeal(NP) swabs in vial transport medium  Result Value Ref Range Status   SARS Coronavirus 2 by RT PCR NEGATIVE NEGATIVE Final    Comment: (NOTE) SARS-CoV-2 target nucleic acids are NOT DETECTED.  The SARS-CoV-2 RNA is  generally detectable in upper respiratory specimens during the acute phase of infection. The lowest concentration of SARS-CoV-2 viral copies this assay can detect is 138 copies/mL. A negative result does not preclude SARS-Cov-2 infection and should not be used as the sole basis for treatment or other patient management decisions. A negative result may occur with  improper specimen collection/handling, submission of specimen other than nasopharyngeal swab, presence of viral mutation(s) within the areas targeted by this assay, and inadequate number of viral copies(<138 copies/mL). A negative result must be combined with clinical observations, patient history, and epidemiological information. The expected result is Negative.  Fact Sheet for Patients:  EntrepreneurPulse.com.au  Fact Sheet for Healthcare Providers:  IncredibleEmployment.be  This test is no t yet approved or cleared by the [place] FDA and  has been authorized for detection and/or diagnosis of SARS-CoV-2 by FDA under an Emergency Use Authorization (EUA). This EUA will remain  in effect (meaning this test can be used) for the duration of the COVID-19 declaration under Section 564(b)(1) of the Act, 21 U.S.C.section 360bbb-3(b)(1), unless the authorization is terminated  or revoked sooner.       Influenza A by PCR NEGATIVE NEGATIVE Final   Influenza B by PCR NEGATIVE NEGATIVE Final    Comment: (NOTE) The Xpert Xpress SARS-CoV-2/FLU/RSV plus assay is intended as an aid in the diagnosis of influenza from Nasopharyngeal swab specimens and should not be used as a sole basis for treatment. Nasal washings and aspirates are unacceptable for Xpert Xpress SARS-CoV-2/FLU/RSV testing.  Fact Sheet for Patients: EntrepreneurPulse.com.au  Fact Sheet for Healthcare Providers: IncredibleEmployment.be  This test is not yet approved or cleared by the [place] FDA and has been authorized for detection and/or diagnosis of SARS-CoV-2 by FDA under an Emergency Use Authorization (EUA). This EUA will remain in effect (meaning this test can be used) for the duration of the COVID-19 declaration under Section 564(b)(1) of the Act, 21 U.S.C. section 360bbb-3(b)(1), unless the authorization is terminated or revoked.  Performed at [facility], [place] [address] [place], [place] [address]   Culture, blood (routine x 2)     Status: Abnormal   Collection Time: 08/05/20  9:31 PM   Specimen: BLOOD  Result Value Ref Range Status   Specimen Description BLOOD SITE NOT SPECIFIED  Final   Special Requests   Final    BOTTLES DRAWN AEROBIC ONLY Blood Culture results may not be optimal due to an inadequate volume of blood received in culture bottles   Culture  Setup Time   Final    GRAM NEGATIVE RODS AEROBIC BOTTLE ONLY CRITICAL VALUE NOTED.  VALUE IS CONSISTENT WITH PREVIOUSLY REPORTED AND CALLED VALUE.    Culture (A)  Final    ESCHERICHIA  COLI SUSCEPTIBILITIES PERFORMED ON PREVIOUS CULTURE WITHIN THE LAST 5 DAYS. Performed at [facility], [place] [address], [place], [place] [address]    Report Status 08/08/2020 FINAL  Final  Culture, blood (routine x 2)     Status: Abnormal   Collection Time: 08/05/20  9:31 PM   Specimen: BLOOD  Result Value Ref Range Status   Specimen Description BLOOD SITE NOT SPECIFIED  Final   Special Requests   Final    BOTTLES DRAWN AEROBIC ONLY Blood Culture adequate volume   Culture  Setup Time   Final    GRAM NEGATIVE RODS AEROBIC BOTTLE ONLY Organism ID to follow CRITICAL RESULT CALLED TO, READ BACK BY AND VERIFIED WITH: A. Rogers Blocker PharmD 10:10 08/06/20 ([email or web address]) Performed at [facility], [place] [address], [place], [place] [address]    Culture ESCHERICHIA COLI (A)  Final   Report Status 08/08/2020 FINAL  Final   Organism ID, Bacteria ESCHERICHIA COLI  Final      Susceptibility   Escherichia coli - MIC*     AMPICILLIN 4 SENSITIVE Sensitive     CEFAZOLIN <=4 SENSITIVE Sensitive     CEFEPIME <=0.12 SENSITIVE Sensitive     CEFTAZIDIME <=1 SENSITIVE Sensitive     CEFTRIAXONE <=0.25 SENSITIVE Sensitive     CIPROFLOXACIN >=4 RESISTANT Resistant     GENTAMICIN <=1 SENSITIVE Sensitive     IMIPENEM <=0.25 SENSITIVE Sensitive     TRIMETH/SULFA <=20 SENSITIVE Sensitive     AMPICILLIN/SULBACTAM <=2 SENSITIVE Sensitive     PIP/TAZO <=4 SENSITIVE Sensitive     * ESCHERICHIA COLI  Blood Culture ID Panel (Reflexed)     Status: Abnormal   Collection Time: 08/05/20  9:31 PM  Result Value Ref Range Status   Enterococcus faecalis NOT DETECTED NOT DETECTED Final   Enterococcus Faecium NOT DETECTED NOT DETECTED Final   Listeria monocytogenes NOT DETECTED NOT DETECTED Final   Staphylococcus species NOT DETECTED NOT DETECTED Final   Staphylococcus aureus (BCID) NOT DETECTED NOT DETECTED Final   Staphylococcus epidermidis NOT DETECTED NOT DETECTED Final   Staphylococcus lugdunensis NOT DETECTED NOT DETECTED Final   Streptococcus species NOT DETECTED NOT DETECTED Final   Streptococcus agalactiae NOT DETECTED NOT DETECTED Final   Streptococcus pneumoniae NOT DETECTED NOT DETECTED Final   Streptococcus pyogenes NOT DETECTED NOT DETECTED Final   A.calcoaceticus-baumannii NOT DETECTED NOT DETECTED Final   Bacteroides fragilis NOT DETECTED NOT DETECTED Final   Enterobacterales DETECTED (A) NOT DETECTED Final    Comment: Enterobacterales represent a large order of gram negative bacteria, not a single organism. CRITICAL RESULT CALLED TO, READ BACK BY AND VERIFIED WITH: A. Rogers Blocker PharmD 10:10 08/06/20 ([email or web address])    Enterobacter cloacae complex NOT DETECTED NOT DETECTED Final   Escherichia coli DETECTED (A) NOT DETECTED Final    Comment: CRITICAL RESULT CALLED TO, READ BACK BY AND VERIFIED WITH: A. Rogers Blocker PharmD 10:10 08/06/20 ([email or web address])    Klebsiella aerogenes NOT DETECTED NOT DETECTED Final   Klebsiella oxytoca NOT  DETECTED NOT DETECTED Final   Klebsiella pneumoniae NOT DETECTED NOT DETECTED Final   Proteus species NOT DETECTED NOT DETECTED Final   Salmonella species NOT DETECTED NOT DETECTED Final   Serratia marcescens NOT DETECTED NOT DETECTED Final   Haemophilus influenzae NOT DETECTED NOT DETECTED Final   Neisseria meningitidis NOT DETECTED NOT DETECTED Final   Pseudomonas aeruginosa NOT DETECTED NOT DETECTED Final   Stenotrophomonas maltophilia NOT DETECTED NOT DETECTED Final   Candida albicans NOT DETECTED NOT DETECTED Final  Candida auris NOT DETECTED NOT DETECTED Final   Candida glabrata NOT DETECTED NOT DETECTED Final   Candida krusei NOT DETECTED NOT DETECTED Final   Candida parapsilosis NOT DETECTED NOT DETECTED Final   Candida tropicalis NOT DETECTED NOT DETECTED Final   Cryptococcus neoformans/gattii NOT DETECTED NOT DETECTED Final   CTX-M ESBL NOT DETECTED NOT DETECTED Final   Carbapenem resistance IMP NOT DETECTED NOT DETECTED Final   Carbapenem resistance KPC NOT DETECTED NOT DETECTED Final   Carbapenem resistance NDM NOT DETECTED NOT DETECTED Final   Carbapenem resist OXA 48 LIKE NOT DETECTED NOT DETECTED Final   Carbapenem resistance VIM NOT DETECTED NOT DETECTED Final    Comment: Performed at [facility] [address], [place], [place] [address]  Culture, Urine     Status: Abnormal   Collection Time: 08/06/20  1:14 PM   Specimen: Urine, Random  Result Value Ref Range Status   Specimen Description URINE, RANDOM  Final   Special Requests   Final    NONE Performed at [facility], [place] [address] [place], [place] [address]    Culture MULTIPLE SPECIES PRESENT, SUGGEST RECOLLECTION (A)  Final   Report Status 08/21/2020 FINAL  Final    Procedures and diagnostic studies:  US RENAL  Result Date: 08-21-20 CLINICAL DATA:  Acute renal injury. EXAM: RENAL / URINARY TRACT ULTRASOUND COMPLETE COMPARISON:  CT scan 05/27/2015 FINDINGS: Right Kidney: Renal  measurements: 9.4 x 4.1 x 5.8 cm = volume: 115.9 mL. Normal renal cortical thickness and echogenicity for age. No worrisome renal lesions or hydronephrosis. Left Kidney: Renal measurements: 9.1 x 4.7 x 4.9 cm = volume: 116.6 mL. Normal renal cortical thickness and echogenicity for age. Simple 2 cm cyst, unchanged since prior CT scan. No worrisome renal lesions or hydronephrosis. Bladder: Normal Other: None. IMPRESSION: Unremarkable renal ultrasound examination. Electronically Signed   By: Marijo Sanes M.D.   On: August 21, 2020 11:53    Medications:   . aspirin EC  325 mg Oral Daily  . atorvastatin  40 mg Oral Daily  . carbidopa-levodopa  3 tablet Oral BID  . clopidogrel  75 mg Oral Daily  . enoxaparin (LOVENOX) injection  40 mg Subcutaneous Q24H  . LORazepam  1 mg Intravenous Once  . midodrine  10 mg Oral TID WC  . pantoprazole  40 mg Oral Daily  . polyethylene glycol  17 g Oral Daily  . primidone  150 mg Oral BID  . umeclidinium bromide  1 puff Inhalation Daily  . venlafaxine  37.5 mg Oral BID  . vitamin B-12  1,000 mcg Oral Daily   Continuous Infusions: . sodium chloride 100 mL/hr at 08-21-20 0913  . 0.9 % NaCl with KCl 40 mEq / L 100 mL/hr at 08/09/20 0631  .  ceFAZolin (ANCEF) IV 1 g (08/09/20 0508)     LOS: 13 days   Jacquelynn Cree, MD  [facility]   [facility] How to contact the [facility] Attending or Consulting provider [place] or covering provider during after hours [place], for this patient?  1. Check the care team in [facility] and look for a) attending/consulting [facility] provider listed and b) the [facility] team listed 2. Log into [email or web address] and use [place]'s universal password to access. If you do not have the password, please contact the hospital operator. 3. Locate the [facility] provider you are looking for under [facility] and page to a number that you can be directly reached. 4. If you  still have difficulty reaching the provider, please page the [facility] (Director on Call)  for the Hospitalists listed on amion for assistance.  08/09/2020, 7:34 AM

## 2020-08-09 NOTE — Progress Notes (Addendum)
Palliative Medicine Inpatient Follow Up Note   HPI: Palliative Care consult requested for goals of care discussion in this89 y.o.malewith multiple medical problems including prostate cancer in remission, history of other nonhemorrhagic CVA, TIA, Parkinson's disease, migraine headaches, cervical disc disease,anddementia. Patient presented to [facility] after presenting to the ED at [facility] with concerns of AMS and found to have an acute nonhemorrhagic CVA. Per notation family reports patient was in a normal state of health on 07/24/20. On 1/5 patient had episode of tachycardia, hypotension, with severe left flaccid hemiparesis. Head CT showed new subtle hypodensities adjacent to the evolving subacute right MCA.  Today's Discussion (08/09/2020): Chart reviewed.   I met with Chad this morning. He was asking me at this time for "cold water" and yelling out "good lord". I provided Gwyndolyn Saxon with water and repositioned him. I asked him how I could be of greater help. He shares with me that he is "no too good". I asked him to elaborate on this and he continued to be relatively tangential again discussing cold water.   I returned to patients room later in the afternoon and met at bedside with patients son, [name]. I asked [name] how things had been going. He shares with me that he was hopeful for recovery after the patients "first stroke" but in the setting of the second stroke and patient urinary infection he is becoming less hopeful. I asked him to explain these feeling more. He goes into depth on the fact that his mother too had suffered a stroke though her impairments in many ways were worse than [place]. He states that he and his siblings banded together to bring her home and she died in her home. I asked him if this was under hospice care which he stated that it was not. I acknowledged that his mother dying in her own home must have provided his family with peace - he shares that it did.   [name]  goes on to share that he realizes how poorly his father has been eating and drinking. We discussed that long term this is not sustainable. If he goes to skilled nursing he would likely come right back to the hospital. We discussed that in [place] instance a G-Tube or Coretrack would not be advisable as it will only prolong suffering without great benefit. I asked [name] is his father could make this decision would he want a feeding tube? [name] shares that he would not. I introduced the topic of hospice.  I described hospice as a service for patients for have a life expectancy of < 6 months. It preserves dignity and quality at the end phases of life. The focus changes from curative to symptom relief. I shared that these services can be provided anywhere inclusive of in patients homes. Rachel has two daughters one who is an Therapist, sports and one who is a CNA who were planning to care for him before his condition declined. I stated that this is still possible but the family need to decide what they want the final phases of Bagdasarian life to look like. I shared that unfortunately I do not feel that Rhet has much time left and its important to decide on if we want to continue with these aggressive measures or allow Trav[place] some symptom relief as he end his final journey.   Given that there are many children involved in [place] life I have offered to help facility a family meeting via phone conference so that everyone may hear the  same information in real time. [name] states that he will discuss this with his family and have a decision either way by tomorrow morning.   We further discussed a MOST form which I shared is valuable to review in cases like [place] as it is truly a helpful guide in care.  We discussed our bodies natural process of death and dying. Provided insights on physiological changes that may take place.  Discussed the importance of continued conversation with family and their  medical providers  regarding overall plan of care and treatment options, ensuring decisions are within the context of the patients values and GOCs.  Questions and concerns addressed   Objective Assessment: Vital Signs Vitals:   08/09/20 0832 08/09/20 1159  BP: (!) 141/74 (!) 143/81  Pulse: 98 96  Resp: 18 19  Temp: 98.2 F (36.8 C) (!) 97.5 F (36.4 C)  SpO2: 99% 99%    Intake/Output Summary (Last 24 hours) at 08/09/2020 1327 Last data filed at 08/09/2020 1205 Gross per 24 hour  Intake -  Output 1300 ml  Net -1300 ml   Last Weight  Most recent update: 07/25/2020  7:25 PM   Weight  72.6 kg (160 lb)           Gen:  [place] [facility] AA M HEENT: Dry mucous membranes CV: Regular rate and rhythm  PULM: On RA, clear to auscultation bilaterally  ABD: soft/nontender  EXT: L UE edema Neuro: Disoriented  SUMMARY OF RECOMMENDATIONS   DNAR/DNI  Treat what is Treatable  I would not recommend a feeding tube in [place] case as it is more likely to cause more harm than benefit  It would be of benefit to have a telephone conference with Rieth family given his fragile health state - patients son, [name] plans to talk to family regarding a time that may work best for tomorrow  Hospice at this point seems more than appropriate as a consideration - I did introduce this topic in conversation  My colleague, Alda Lea will follow up tomorrow - please let our team know if additional assistance is required sooner as we are happy to intervene  Time Spent: 18 Greater than 50% of the time was spent in counseling and coordination of care ______________________________________________________________________________________ [place] Team Team Cell Phone: [phone] Please utilize secure chat with additional questions, if there is no response within 30 minutes please call the above phone number  Palliative Medicine Team providers are available by phone  from 7am to 7pm daily and can be reached through the team cell phone.  Should this patient require assistance outside of these hours, please call the patient's attending physician.

## 2020-08-10 DIAGNOSIS — I10 Essential (primary) hypertension: Secondary | ICD-10-CM | POA: Diagnosis not present

## 2020-08-10 DIAGNOSIS — E43 Unspecified severe protein-calorie malnutrition: Secondary | ICD-10-CM | POA: Insufficient documentation

## 2020-08-10 LAB — HEPATIC FUNCTION PANEL
ALT: 68 U/L — ABNORMAL HIGH (ref 0–44)
AST: 175 U/L — ABNORMAL HIGH (ref 15–41)
Albumin: 2.1 g/dL — ABNORMAL LOW (ref 3.5–5.0)
Alkaline Phosphatase: 461 U/L — ABNORMAL HIGH (ref 38–126)
Bilirubin, Direct: 3.4 mg/dL — ABNORMAL HIGH (ref 0.0–0.2)
Indirect Bilirubin: 1.9 mg/dL — ABNORMAL HIGH (ref 0.3–0.9)
Total Bilirubin: 5.3 mg/dL — ABNORMAL HIGH (ref 0.3–1.2)
Total Protein: 5.6 g/dL — ABNORMAL LOW (ref 6.5–8.1)

## 2020-08-10 MED ORDER — LACTATED RINGERS IV SOLN: INTRAVENOUS | Status: DC

## 2020-08-10 MED ORDER — PANTOPRAZOLE SODIUM 40 MG PO PACK
40.0000 mg | PACK | Freq: Every day | ORAL | Status: DC
Start: 2020-08-11 — End: 2020-08-26
  Administered 2020-08-16 – 2020-08-23 (×5): 40 mg via ORAL
  Filled 2020-08-10 (×9): qty 20

## 2020-08-10 MED ORDER — ADULT MULTIVITAMIN W/MINERALS CH: 1.0000 | ORAL_TABLET | Freq: Every day | ORAL | Status: DC

## 2020-08-10 NOTE — Plan of Care (Signed)
  Problem: Education: Goal: Knowledge of disease or condition will improve Outcome: Not Progressing Goal: Knowledge of secondary prevention will improve Outcome: Not Progressing   Problem: Self-Care: Goal: Ability to participate in self-care as condition permits will improve Outcome: Not Progressing   Problem: Ischemic Stroke/TIA Tissue Perfusion: Goal: Complications of ischemic stroke/TIA will be minimized Outcome: Not Progressing   Problem: Clinical Measurements: Goal: Ability to maintain clinical measurements within normal limits will improve Outcome: Not Progressing

## 2020-08-10 NOTE — Plan of Care (Signed)
  Problem: Education: Goal: Knowledge of disease or condition will improve Outcome: Not Progressing Goal: Knowledge of secondary prevention will improve Outcome: Not Progressing   Problem: Health Behavior/Discharge Planning: Goal: Ability to manage health-related needs will improve Outcome: Not Progressing   Problem: Self-Care: Goal: Verbalization of feelings and concerns over difficulty with self-care will improve Outcome: Not Progressing   Problem: Nutrition: Goal: Dietary intake will improve Outcome: Not Progressing   Problem: Education: Goal: Knowledge of disease or condition will improve Outcome: Not Progressing

## 2020-08-10 NOTE — Plan of Care (Signed)
  Problem: Education: Goal: Knowledge of disease or condition will improve Outcome: Progressing Goal: Knowledge of secondary prevention will improve Outcome: Progressing Goal: Knowledge of patient specific risk factors addressed and post discharge goals established will improve Outcome: Progressing Goal: Individualized Educational Video(s) Outcome: Progressing   Problem: Coping: Goal: Will verbalize positive feelings about self Outcome: Progressing   Problem: Health Behavior/Discharge Planning: Goal: Ability to manage health-related needs will improve Outcome: Progressing   Problem: Self-Care: Goal: Ability to participate in self-care as condition permits will improve Outcome: Progressing Goal: Verbalization of feelings and concerns over difficulty with self-care will improve Outcome: Progressing Goal: Ability to communicate needs accurately will improve Outcome: Progressing   Problem: Nutrition: Goal: Risk of aspiration will decrease Outcome: Progressing Goal: Dietary intake will improve Outcome: Progressing   Problem: Ischemic Stroke/TIA Tissue Perfusion: Goal: Complications of ischemic stroke/TIA will be minimized Outcome: Progressing   Problem: Education: Goal: Knowledge of General Education information will improve Description: Including pain rating scale, medication(s)/side effects and non-pharmacologic comfort measures Outcome: Progressing   Problem: Health Behavior/Discharge Planning: Goal: Ability to manage health-related needs will improve Outcome: Progressing   Problem: Clinical Measurements: Goal: Ability to maintain clinical measurements within normal limits will improve Outcome: Progressing Goal: Will remain free from infection Outcome: Progressing Goal: Diagnostic test results will improve Outcome: Progressing Goal: Respiratory complications will improve Outcome: Progressing Goal: Cardiovascular complication will be avoided Outcome: Progressing    Problem: Activity: Goal: Risk for activity intolerance will decrease Outcome: Progressing   Problem: Nutrition: Goal: Adequate nutrition will be maintained Outcome: Progressing   Problem: Coping: Goal: Level of anxiety will decrease Outcome: Progressing   Problem: Elimination: Goal: Will not experience complications related to bowel motility Outcome: Progressing Goal: Will not experience complications related to urinary retention Outcome: Progressing   Problem: Pain Managment: Goal: General experience of comfort will improve Outcome: Progressing   Problem: Safety: Goal: Ability to remain free from injury will improve Outcome: Progressing   Problem: Skin Integrity: Goal: Risk for impaired skin integrity will decrease Outcome: Progressing   Problem: Education: Goal: Knowledge of disease or condition will improve Outcome: Progressing   Problem: Intracerebral Hemorrhage Tissue Perfusion: Goal: Complications of Intracerebral Hemorrhage will be minimized Outcome: Progressing   Problem: Ischemic Stroke/TIA Tissue Perfusion: Goal: Complications of ischemic stroke/TIA will be minimized Outcome: Progressing   Problem: Spontaneous Subarachnoid Hemorrhage Tissue Perfusion: Goal: Complications of Spontaneous Subarachnoid Hemorrhage will be minimized Outcome: Progressing

## 2020-08-10 NOTE — Progress Notes (Signed)
PROGRESS NOTE    Jeffrey Blair  BPZ:[id number] DOB: Nov 12, 1927 DOA: 07/25/2020 PCP: [facility]   Brief Narrative: 87 year old with past medical history significant for Parkinson's disease, dementia, debility history of cerebellar CVA, cervical DDD who was admitted 07/25/2020 for worsening balance/mobility, worsening confusion and dysarthria.  He was initially taken to [facility] ER, but patient got agitated with a very long wait time, signed out AMA by family, then brought to admit patient the following day and then ultimately to [facility].  MRI show an acute widespread infarct involving approximately 7 cm area of the right MCA/PCA territory, in addition to chronic infarcts.  He failed a swallow performed on 07/27/2020 with a speech therapy, later started on dysphagia 1 diet.  Overnight 1/5/106 patient had episode of tachycardia, hypotension and had severe left flaccid hemiparesis and underwent repeat CT head which revealed new subtle hypodensity adjacent to the evolving subacute right MCA distribution infarct concerning for new acute component interval expansion.  Blood cultures resulted with E. coli and patient was started on IV antibiotics.  Treated with IV fluids.    Assessment & Plan:   Principal Problem:   Acute ischemic stroke (HCC) Active Problems:   COPD (chronic obstructive pulmonary disease) (HCC)   Parkinson disease (HCC)   Hyperlipidemia   Hypertension   Normocytic anemia   Acute CVA (cerebrovascular accident) ([place])   Protein calorie malnutrition ([place])   Underweight due to inadequate caloric intake   Protein-calorie malnutrition, severe   1-Right MCA CVA: -Neurology recommended daily aspirin and Plavix for 3 months followed by aspirin daily. -Recommended 30-day event monitor as an outpatient -Continue with Lipitor -Repeat CT head 08/06/2020 revealed suspected new subtle hypodensity adjacent to the evolving subacute right MCA distribution infarct,  concerning for new acute component interval expansion. PT OT recommending skilled nursing facility. Evaluated by palliative care, poor prognosis but family not ready to move to comfort care. Continue conversation with palliative care   2-Sepsis secondary to E. coli bacteremia, sepsis not present on admission Patient has spiked fever 101 and 08/05/2020 along with tachycardia heart rate 130, tachypnea with respiration of 20, hypotension with systolic blood pressure in the 80s. -Blood cultures obtained 08/05/2020 showed E. Coli -Urine culture with multiple species of bacteria Midodrine ordered to assist with blood Antibiotics narrowed to Ancef  Hyponatremia: May be polydipsia may be psychogenic Metabolic acidosis:  Leukocytosis: Secondary to infection. Follow WBC>   dysphagia 1 diet: Poor oral intake  Hypokalemia: replaced.    Persistent headache: Repeated CT head showed interval evolution of subacute right MCA infarct without hemorrhage or mass Continue with symptoms management  History of  Parkinson's disease, dementia Continue with Sinemet primidone   Depression/anxiety continue with Effexor Severe Malnutrition in the context of chronic illness: Continue with Ensure 3 times daily and supplementation Magic cup 3 times daily.   Nutrition Problem: Severe Malnutrition Etiology: chronic illness (dementia, Parkinson's disease)    Signs/Symptoms: moderate fat depletion,severe fat depletion,moderate muscle depletion,severe muscle depletion    Interventions: Ensure Enlive (each supplement provides 350kcal and 20 grams of protein),Magic cup,MVI,Hormel Shake  Estimated body mass index is 21.11 kg/m as calculated from the following:   Height as of this encounter: 6\' 1"  (1.854 m).   Weight as of this encounter: 72.6 kg.   DVT prophylaxis: Lovenox Code Status: DNR Family Communication: Disposition Plan:  Status is: Inpatient  Remains inpatient appropriate because:IV treatments  appropriate due to intensity of illness or inability to take PO   Dispo:  The patient is from: Home              Anticipated d/c is to: to be determine              Anticipated d/c date is: 3 days              Patient currently is not medically stable to d/c.        Consultants:   Neurology  palliative  Procedures:     Antimicrobials:    Subjective: Alert, asking for water, poor oral intake  Objective: Vitals:   08/10/20 0100 08/10/20 0400 08/10/20 0754 08/10/20 1200  BP: (!) 144/85 (!) 141/84 139/87 (!) 146/93  Pulse:    (!) 102  Resp: 17 14 16 16   Temp:  98.2 F (36.8 C) 98.3 F (36.8 C) 98.1 F (36.7 C)  TempSrc:  Oral Oral Oral  SpO2:  98% 98% 98%  Weight:      Height:        Intake/Output Summary (Last 24 hours) at 08/10/2020 1421 Last data filed at 08/10/2020 1400 Gross per 24 hour  Intake 5504.7 ml  Output 1800 ml  Net 3704.7 ml   Filed Weights   07/25/20 1925  Weight: 72.6 kg    Examination:  General exam: Appears calm and comfortable  Respiratory system: Clear to auscultation. Respiratory effort normal. Cardiovascular system: S1 & S2 heard, RRR. No JVD, murmurs, rubs, gallops or clicks. No pedal edema. Gastrointestinal system: Abdomen is nondistended, soft and nontender. No organomegaly or masses felt. Normal bowel sounds heard. Central nervous system: Alert, confuse Extremities: no edema    Data Reviewed: I have personally reviewed following labs and imaging studies  CBC: Recent Labs  Lab 08/04/20 0605 08/06/20 1143 08/07/20 0203 08/08/20 0425  WBC 4.3 22.4* 12.0* 15.4*  HGB 14.2 11.7* 11.6* 11.0*  HCT 41.0 35.4* 34.7* 32.4*  MCV 86.5 91.5 87.4 86.6  PLT 189 137* 122*  --    Basic Metabolic Panel: Recent Labs  Lab 08/04/20 0605 08/06/20 1143 08/07/20 0203 08/08/20 0425 08/09/20 0228  NA 133* 136 135 137 133*  K 3.9 4.0 4.2 3.0* 4.3  CL 96* 102 102 105 104  CO2 25 19* 22 20* 16*  GLUCOSE 80 76 75 68* 74  BUN 22 30*  39* 42* 32*  CREATININE 0.81 1.91* 2.13* 1.59* 1.07  CALCIUM 8.9 8.1* 7.5* 7.3* 7.2*   GFR: Estimated Creatinine Clearance: 47.1 mL/min (by C-G formula based on SCr of 1.07 mg/dL). Liver Function Tests: No results for input(s): AST, ALT, ALKPHOS, BILITOT, PROT, ALBUMIN in the last 168 hours. No results for input(s): LIPASE, AMYLASE in the last 168 hours. No results for input(s): AMMONIA in the last 168 hours. Coagulation Profile: No results for input(s): INR, PROTIME in the last 168 hours. Cardiac Enzymes: No results for input(s): CKTOTAL, CKMB, CKMBINDEX, TROPONINI in the last 168 hours. BNP (last 3 results) No results for input(s): PROBNP in the last 8760 hours. HbA1C: No results for input(s): HGBA1C in the last 72 hours. CBG: Recent Labs  Lab 08/05/20 2338  GLUCAP 108*   Lipid Profile: No results for input(s): CHOL, HDL, LDLCALC, TRIG, CHOLHDL, LDLDIRECT in the last 72 hours. Thyroid Function Tests: No results for input(s): TSH, T4TOTAL, FREET4, T3FREE, THYROIDAB in the last 72 hours. Anemia Panel: No results for input(s): VITAMINB12, FOLATE, FERRITIN, TIBC, IRON, RETICCTPCT in the last 72 hours. Sepsis Labs: Recent Labs  Lab 08/06/20 1143 08/06/20 1337 08/07/20 1224 08/07/20 1526  LATICACIDVEN 4.8* 5.1* 1.8 1.8    Recent Results (from the past 240 hour(s))  Resp Panel by RT-PCR (Flu A&B, Covid) Nasopharyngeal Swab     Status: None   Collection Time: 08/04/20  2:07 PM   Specimen: Nasopharyngeal Swab; Nasopharyngeal(NP) swabs in vial transport medium  Result Value Ref Range Status   SARS Coronavirus 2 by RT PCR NEGATIVE NEGATIVE Final    Comment: (NOTE) SARS-CoV-2 target nucleic acids are NOT DETECTED.  The SARS-CoV-2 RNA is generally detectable in upper respiratory specimens during the acute phase of infection. The lowest concentration of SARS-CoV-2 viral copies this assay can detect is 138 copies/mL. A negative result does not preclude SARS-Cov-2 infection and  should not be used as the sole basis for treatment or other patient management decisions. A negative result may occur with  improper specimen collection/handling, submission of specimen other than nasopharyngeal swab, presence of viral mutation(s) within the areas targeted by this assay, and inadequate number of viral copies(<138 copies/mL). A negative result must be combined with clinical observations, patient history, and epidemiological information. The expected result is Negative.  Fact Sheet for Patients:  EntrepreneurPulse.com.au  Fact Sheet for Healthcare Providers:  IncredibleEmployment.be  This test is no t yet approved or cleared by the [place] FDA and  has been authorized for detection and/or diagnosis of SARS-CoV-2 by FDA under an Emergency Use Authorization (EUA). This EUA will remain  in effect (meaning this test can be used) for the duration of the COVID-19 declaration under Section 564(b)(1) of the Act, 21 U.S.C.section 360bbb-3(b)(1), unless the authorization is terminated  or revoked sooner.       Influenza A by PCR NEGATIVE NEGATIVE Final   Influenza B by PCR NEGATIVE NEGATIVE Final    Comment: (NOTE) The Xpert Xpress SARS-CoV-2/FLU/RSV plus assay is intended as an aid in the diagnosis of influenza from Nasopharyngeal swab specimens and should not be used as a sole basis for treatment. Nasal washings and aspirates are unacceptable for Xpert Xpress SARS-CoV-2/FLU/RSV testing.  Fact Sheet for Patients: EntrepreneurPulse.com.au  Fact Sheet for Healthcare Providers: IncredibleEmployment.be  This test is not yet approved or cleared by the [place] FDA and has been authorized for detection and/or diagnosis of SARS-CoV-2 by FDA under an Emergency Use Authorization (EUA). This EUA will remain in effect (meaning this test can be used) for the duration of the COVID-19 declaration  under Section 564(b)(1) of the Act, 21 U.S.C. section 360bbb-3(b)(1), unless the authorization is terminated or revoked.  Performed at [facility], [place] [address] [place], [place] [address]   Culture, blood (routine x 2)     Status: Abnormal   Collection Time: 08/05/20  9:31 PM   Specimen: BLOOD  Result Value Ref Range Status   Specimen Description BLOOD SITE NOT SPECIFIED  Final   Special Requests   Final    BOTTLES DRAWN AEROBIC ONLY Blood Culture results may not be optimal due to an inadequate volume of blood received in culture bottles   Culture  Setup Time   Final    GRAM NEGATIVE RODS AEROBIC BOTTLE ONLY CRITICAL VALUE NOTED.  VALUE IS CONSISTENT WITH PREVIOUSLY REPORTED AND CALLED VALUE.    Culture (A)  Final    ESCHERICHIA COLI SUSCEPTIBILITIES PERFORMED ON PREVIOUS CULTURE WITHIN THE LAST 5 DAYS. Performed at [facility], [place] [address] [place], [place] [address]    Report Status 08/08/2020 FINAL  Final  Culture, blood (routine x 2)     Status:  Abnormal   Collection Time: 08/05/20  9:31 PM   Specimen: BLOOD  Result Value Ref Range Status   Specimen Description BLOOD SITE NOT SPECIFIED  Final   Special Requests   Final    BOTTLES DRAWN AEROBIC ONLY Blood Culture adequate volume   Culture  Setup Time   Final    GRAM NEGATIVE RODS AEROBIC BOTTLE ONLY Organism ID to follow CRITICAL RESULT CALLED TO, READ BACK BY AND VERIFIED WITH: A. Rogers Blocker PharmD 10:10 08/06/20 ([email or web address]) Performed at [facility], [place] [address] [place], [place] [address]    Culture ESCHERICHIA COLI (A)  Final   Report Status 08/08/2020 FINAL  Final   Organism ID, Bacteria ESCHERICHIA COLI  Final      Susceptibility   Escherichia coli - MIC*    AMPICILLIN 4 SENSITIVE Sensitive     CEFAZOLIN <=4 SENSITIVE Sensitive     CEFEPIME <=0.12 SENSITIVE Sensitive     CEFTAZIDIME <=1 SENSITIVE Sensitive     CEFTRIAXONE <=0.25 SENSITIVE Sensitive     CIPROFLOXACIN >=4 RESISTANT Resistant      GENTAMICIN <=1 SENSITIVE Sensitive     IMIPENEM <=0.25 SENSITIVE Sensitive     TRIMETH/SULFA <=20 SENSITIVE Sensitive     AMPICILLIN/SULBACTAM <=2 SENSITIVE Sensitive     PIP/TAZO <=4 SENSITIVE Sensitive     * ESCHERICHIA COLI  Blood Culture ID Panel (Reflexed)     Status: Abnormal   Collection Time: 08/05/20  9:31 PM  Result Value Ref Range Status   Enterococcus faecalis NOT DETECTED NOT DETECTED Final   Enterococcus Faecium NOT DETECTED NOT DETECTED Final   Listeria monocytogenes NOT DETECTED NOT DETECTED Final   Staphylococcus species NOT DETECTED NOT DETECTED Final   Staphylococcus aureus (BCID) NOT DETECTED NOT DETECTED Final   Staphylococcus epidermidis NOT DETECTED NOT DETECTED Final   Staphylococcus lugdunensis NOT DETECTED NOT DETECTED Final   Streptococcus species NOT DETECTED NOT DETECTED Final   Streptococcus agalactiae NOT DETECTED NOT DETECTED Final   Streptococcus pneumoniae NOT DETECTED NOT DETECTED Final   Streptococcus pyogenes NOT DETECTED NOT DETECTED Final   A.calcoaceticus-baumannii NOT DETECTED NOT DETECTED Final   Bacteroides fragilis NOT DETECTED NOT DETECTED Final   Enterobacterales DETECTED (A) NOT DETECTED Final    Comment: Enterobacterales represent a large order of gram negative bacteria, not a single organism. CRITICAL RESULT CALLED TO, READ BACK BY AND VERIFIED WITH: A. Rogers Blocker PharmD 10:10 08/06/20 ([email or web address])    Enterobacter cloacae complex NOT DETECTED NOT DETECTED Final   Escherichia coli DETECTED (A) NOT DETECTED Final    Comment: CRITICAL RESULT CALLED TO, READ BACK BY AND VERIFIED WITH: A. Rogers Blocker PharmD 10:10 08/06/20 ([email or web address])    Klebsiella aerogenes NOT DETECTED NOT DETECTED Final   Klebsiella oxytoca NOT DETECTED NOT DETECTED Final   Klebsiella pneumoniae NOT DETECTED NOT DETECTED Final   Proteus species NOT DETECTED NOT DETECTED Final   Salmonella species NOT DETECTED NOT DETECTED Final   Serratia marcescens NOT DETECTED NOT DETECTED Final    Haemophilus influenzae NOT DETECTED NOT DETECTED Final   Neisseria meningitidis NOT DETECTED NOT DETECTED Final   Pseudomonas aeruginosa NOT DETECTED NOT DETECTED Final   Stenotrophomonas maltophilia NOT DETECTED NOT DETECTED Final   Candida albicans NOT DETECTED NOT DETECTED Final   Candida auris NOT DETECTED NOT DETECTED Final   Candida glabrata NOT DETECTED NOT DETECTED Final   Candida krusei NOT DETECTED NOT DETECTED Final   Candida parapsilosis NOT DETECTED NOT DETECTED Final   Candida tropicalis NOT DETECTED NOT DETECTED  Final   Cryptococcus neoformans/gattii NOT DETECTED NOT DETECTED Final   CTX-M ESBL NOT DETECTED NOT DETECTED Final   Carbapenem resistance IMP NOT DETECTED NOT DETECTED Final   Carbapenem resistance KPC NOT DETECTED NOT DETECTED Final   Carbapenem resistance NDM NOT DETECTED NOT DETECTED Final   Carbapenem resist OXA 48 LIKE NOT DETECTED NOT DETECTED Final   Carbapenem resistance VIM NOT DETECTED NOT DETECTED Final    Comment: Performed at [facility], [place] [address], [place], [place] [address]  Culture, Urine     Status: Abnormal   Collection Time: 08/06/20  1:14 PM   Specimen: Urine, Random  Result Value Ref Range Status   Specimen Description URINE, RANDOM  Final   Special Requests   Final    NONE Performed at [facility], [place] [address] [place], [place] [address]    Culture MULTIPLE SPECIES PRESENT, SUGGEST RECOLLECTION (A)  Final   Report Status 08/07/2020 FINAL  Final         Radiology Studies: No results found.      Scheduled Meds: . aspirin  325 mg Oral Daily  . atorvastatin  40 mg Oral Daily  . carbidopa-levodopa  3 tablet Oral BID  . clopidogrel  75 mg Oral Daily  . enoxaparin (LOVENOX) injection  40 mg Subcutaneous Q24H  . feeding supplement  237 mL Oral TID BM  . midodrine  10 mg Oral TID WC  . multivitamin with minerals  1 tablet Oral Daily  . [START ON 08/11/2020] pantoprazole sodium  40 mg Oral Daily  .  polyethylene glycol  17 g Oral Daily  . primidone  150 mg Oral BID  . umeclidinium bromide  1 puff Inhalation Daily  . venlafaxine  37.5 mg Oral BID  . vitamin B-12  1,000 mcg Oral Daily   Continuous Infusions: .  ceFAZolin (ANCEF) IV Stopped (08/10/20 0733)  . lactated ringers 100 mL/hr at 08/10/20 0753     LOS: 14 days    Time spent: 35 minutes    Jaydon Avina A Zachari Alberta, MD [facility]   If 7PM-7AM, please contact night-coverage [email or web address]  08/10/2020, 2:21 PM

## 2020-08-10 NOTE — Progress Notes (Signed)
Initial Nutrition Assessment  DOCUMENTATION CODES:   Severe malnutrition in context of chronic illness  INTERVENTION:   -Continue Ensure Enlive po TID, each supplement provides 350 kcal and 20 grams of protein -MVI with minerals daily -Magic cup TID with meals, each supplement provides 290 kcal and 9 grams of protein -Hormel Shake TID with meals, each supplement provides 520 kcals and 22 grams protein -Feeding assistance with meals  NUTRITION DIAGNOSIS:   Severe Malnutrition related to chronic illness (dementia, Parkinson's disease) as evidenced by moderate fat depletion,severe fat depletion,moderate muscle depletion,severe muscle depletion.  GOAL:   Patient will meet greater than or equal to 90% of their needs  MONITOR:   PO intake,Supplement acceptance,Diet advancement,Labs,Weight trends,Skin,I & O's  REASON FOR ASSESSMENT:   Consult Assessment of nutrition requirement/status  ASSESSMENT:   Jeffrey Blair is a 87 y.o. male with medical history significant of prostate cancer in remission, history of other nonhemorrhagic CVA, TIA, Parkinson's disease, migraine headaches, cervical disc disease, dementia who is brought to the emergency department by his son from [place] after he was taken very due to AMS secondary to acute other nonhemorrhagic CVA.  Pt admitted with CVA.   12/27- s/p BSE- recommend NPO 12/28- s/p BSE- advanced to dysphagia 1 diet with thin liquids  Reviewed I/O's: +2.5 L x 24 hours and +1.7 L since 07/27/20  UOP: 2.3 L x 24 hours  Spoke with pt at bedside, who was unable to provide any reliable history. No family at bedside to provide additional history. Pt requesting water at time of visit. RD provided pt with sips of water several times during visit, however, pt continues to insist that no one is offering him water or food. He repeatedly asks "give my a big jug of water".   Observed pt breakfast tray, which is unattempted. Reviewed meal completion  records over the past week Meal completions 0-50%, averaging 0-10% over the past 24 hours. Pt unable to consume enough nutrition via PO route to sustain himself.   Suspect wt loss PTA, however, no recent wt hx available to assess at this time.   Palliative care following for ongoing goals of care discussions. Pt family does not desire a feeding tube. Pt also with advanced age, dementia, and Parkinson's and feeding tube is not recommended in pts in the population.   Medications reviewed and include sinemet and vitamin B-12.   Labs reviewed: Na: 133.   NUTRITION - FOCUSED PHYSICAL EXAM:  Flowsheet Row Most Recent Value  Orbital Region Moderate depletion  Upper Arm Region Severe depletion  Thoracic and Lumbar Region Moderate depletion  Buccal Region Severe depletion  Temple Region Severe depletion  Clavicle Bone Region Severe depletion  Clavicle and Acromion Bone Region Severe depletion  Scapular Bone Region Severe depletion  Dorsal Hand Moderate depletion  Patellar Region Moderate depletion  Anterior Thigh Region Moderate depletion  Posterior Calf Region Moderate depletion  Edema (RD Assessment) None  Hair Reviewed  Eyes Reviewed  Mouth Reviewed  Skin Reviewed  Nails Reviewed       Diet Order:   Diet Order            DIET - DYS 1 Room service appropriate? Yes; Fluid consistency: Thin  Diet effective now                 EDUCATION NEEDS:   No education needs have been identified at this time  Skin:  Skin Assessment: Reviewed RN Assessment  Last BM:  08/09/20  Height:  Ht Readings from Last 1 Encounters:  07/25/20 6\' 1"  (1.854 m)    Weight:   Wt Readings from Last 1 Encounters:  07/25/20 72.6 kg    Ideal Body Weight:  83.6 kg  BMI:  Body mass index is 21.11 kg/m.  Estimated Nutritional Needs:   Kcal:  2000-2200  Protein:  95-110 grams  Fluid:  > 2 L    Loistine Chance, RD, LDN, [place] Registered Dietitian II Certified Diabetes Care and Education  Specialist Please refer to [facility] for RD and/or RD on-call/weekend/after hours pager

## 2020-08-11 ENCOUNTER — Inpatient Hospital Stay (HOSPITAL_COMMUNITY): Payer: PPO

## 2020-08-11 DIAGNOSIS — I10 Essential (primary) hypertension: Secondary | ICD-10-CM | POA: Diagnosis not present

## 2020-08-11 LAB — CBC
HCT: 32.5 % — ABNORMAL LOW (ref 39.0–52.0)
Hemoglobin: 11.3 g/dL — ABNORMAL LOW (ref 13.0–17.0)
MCH: 28.9 pg (ref 26.0–34.0)
MCHC: 34.8 g/dL (ref 30.0–36.0)
MCV: 83.1 fL (ref 80.0–100.0)
Platelets: 53 10*3/uL — ABNORMAL LOW (ref 150–400)
RBC: 3.91 MIL/uL — ABNORMAL LOW (ref 4.22–5.81)
RDW: 14.3 % (ref 11.5–15.5)
WBC: 20.5 10*3/uL — ABNORMAL HIGH (ref 4.0–10.5)
nRBC: 0 % (ref 0.0–0.2)

## 2020-08-11 LAB — BASIC METABOLIC PANEL
Anion gap: 9 (ref 5–15)
BUN: 14 mg/dL (ref 8–23)
CO2: 20 mmol/L — ABNORMAL LOW (ref 22–32)
Calcium: 7.5 mg/dL — ABNORMAL LOW (ref 8.9–10.3)
Chloride: 104 mmol/L (ref 98–111)
Creatinine, Ser: 0.67 mg/dL (ref 0.61–1.24)
GFR, Estimated: >60 mL/min (ref >60)
Glucose, Bld: 112 mg/dL — ABNORMAL HIGH (ref 70–99)
Potassium: 3.4 mmol/L — ABNORMAL LOW (ref 3.5–5.1)
Sodium: 133 mmol/L — ABNORMAL LOW (ref 135–145)

## 2020-08-11 MED ORDER — MIDODRINE HCL 5 MG PO TABS
2.5000 mg | ORAL_TABLET | Freq: Three times a day (TID) | ORAL | Status: DC
  Administered 2020-08-13 – 2020-08-25 (×13): 2.5 mg via ORAL
  Filled 2020-08-11 (×21): qty 1

## 2020-08-11 MED ORDER — POTASSIUM CHLORIDE CRYS ER 20 MEQ PO TBCR
40.0000 meq | EXTENDED_RELEASE_TABLET | Freq: Once | ORAL | Status: DC
  Filled 2020-08-11 (×2): qty 2

## 2020-08-11 NOTE — Progress Notes (Signed)
Jeffrey Blair [facility] PROGRESS NOTE    Jeffrey Blair  [id number] DOB: 09/23/1927 DOA: 07/25/2020 PCP: [facility]      Brief Narrative:  Jeffrey Blair is a 87 y.o. M with dementia, ambulatory and home dwelling, Parkinson's disease, prostate CA in remission, COPD and TIA brought due to confusion, speech disturbance.  Initially woke with new slurred speech, seen in the ER at [facility], waiting bed for admission for stroke when he became agitated and so family brought him home.  At home, he was altered and could not follow commands, so he was brought back to [facility], and transferred to [facility] due to stroke.   12/25 admitted for stroke, MRI showed acute large infarct involving approximately 7 cm are of right MCA/PCA territory 12/27-1/4 patient initially failed SLP evaluation; oral intake persistently very poor and not enough to keep up with hydration and nutrition needs, placement delayed 1/5 new hypotension, worsening hemiparesis, CT showed interval expansion of stroke --> resolved with fluids and midodrine 1/10 still without ability to tolerate significant food by mouth, LFTs rising     Assessment & Plan:  Acute right MCA CVA -Continue aspirin, Plavix, atorvastatin  Sepsis due to E coli bacteremia Developed on 1/5 while in the hospital, with fever and tachycardia and hypotension responsive to fluids.  Blood cultures growing E coli, urine culture with multiple species.  -Continue cefazolin -Continue IV fluids -Reduce midodrine to 2.5 TID   Transaminitis New since admission, unclear cause.  Some nonfocal abdominal pain on exam. -Obtain US ultrasound  Parkinson's disease -Continue Sinemet, primidone  Depression -Continue venlafaxine  Dysphagia protein calorie malnutrition with poor PO intake, at risk for severe protein calorie malnutrition Due to stroke.  Patient has now been unable to meet his fluid and calorie needs for over 2 weeks, and has  begun to make expressions of "wanting to go home" to family.   -Continue Ensure as able -Consult dietitian   COPD -Continue LAMA  Hypokalemia -Supplement K  Hyponatremia Mild, asymtomatic  AKI Creatinine was up to 2.1, now resolved to baseline 0.7 with fluids  Thrombocytopenia Worsening, hold heparins -Start SCDs      Disposition: Status is: Inpatient  Remains inpatient appropriate because:IV treatments appropriate due to intensity of illness or inability to take PO   Dispo: The patient is from: Home              Anticipated d/c is to: likely home with Hospice              Anticipated d/c date is: 2 days              Patient currently is not medically stable to d/c.              MDM: The below labs and imaging reports were reviewed and summarized above.  Medication management as above.    DVT prophylaxis: SCDs Start: 07/26/20 0449  Code Status: DNR Family Communication: son Jeffrey Blair at bedside          Subjective: Patient thirsty.  No fever, no respiraotry distres, no vomiting, no diarreha.  Patient has mild pain on abdominal exam, does not elicit this on questioning.   Objective: Vitals:   08/11/20 0329 08/11/20 0735 08/11/20 1434 08/11/20 1603  BP: 134/76 137/71 (!) 159/86 (!) 142/77  Pulse: 86 87 100 (!) 102  Resp: 17 18 18 18   Temp: 98.1 F (36.7 C) 97.8 F (36.6 C) 97.8 F (36.6 C) 99.2 F (37.3 C)  TempSrc: Oral Oral Oral Axillary  SpO2: 96% 97% 98% 99%  Weight:      Height:        Intake/Output Summary (Last 24 hours) at 08/11/2020 1718 Last data filed at 08/11/2020 1611 Gross per 24 hour  Intake 1200 ml  Output 1150 ml  Net 50 ml   Filed Weights   07/25/20 1925  Weight: 72.6 kg    Examination: General appearance: elderly adult male, interactive, makes eye contact, lying in bed, no acute distress, debilitated.   HEENT: Anicteric, conjunctiva pink, lids and lashes normal, watery exudate bilatearlly. No nasal deformity,  discharge, epistaxis.  Lips moist, edentulous, OP moist, no oral lesions, hearing normal.   Skin: Warm and dry.  No jaundice.  No suspicious rashes or lesions on face, neck, chest, abdomen, legs. Cardiac: RRR, nl S1-S2, no murmurs appreciated.  Capillary refill is brisk. No LE edema.    Respiratory: Normal respiratory rate and rhythm.  CTAB without rales or wheezes. Abdomen: Abdomen soft.  Moderate nonfocal TTP with guarding, no rigidity or rebound. No ascites, distension, hepatosplenomegaly.   MSK: No joint effusions.  There is a large, old deformity to the left shin.   Neuro: Awake and alert, asking for water. Head rotated to left.  Left side paralyzed.  Speech weak but fluent.    Psych: Sensorium intact and responding to questions, attention slighly iminished. Affect blunted.  Judgment and insight appear impaired.    Data Reviewed: I have personally reviewed following labs and imaging studies:  CBC: Recent Labs  Lab 08/06/20 1143 08/07/20 0203 08/08/20 0425 08/11/20 0450  WBC 22.4* 12.0* 15.4* 20.5*  HGB 11.7* 11.6* 11.0* 11.3*  HCT 35.4* 34.7* 32.4* 32.5*  MCV 91.5 87.4 86.6 83.1  PLT 137* 122*  --  53*   Basic Metabolic Panel: Recent Labs  Lab 08/06/20 1143 08/07/20 0203 08/08/20 0425 08/09/20 0228 08/11/20 0450  NA 136 135 137 133* 133*  K 4.0 4.2 3.0* 4.3 3.4*  CL 102 102 105 104 104  CO2 19* 22 20* 16* 20*  GLUCOSE 76 75 68* 74 112*  BUN 30* 39* 42* 32* 14  CREATININE 1.91* 2.13* 1.59* 1.07 0.67  CALCIUM 8.1* 7.5* 7.3* 7.2* 7.5*   GFR: Estimated Creatinine Clearance: 63 mL/min (by C-G formula based on SCr of 0.67 mg/dL). Liver Function Tests: Recent Labs  Lab 08/10/20 1455  AST 175*  ALT 68*  ALKPHOS 461*  BILITOT 5.3*  PROT 5.6*  ALBUMIN 2.1*   No results for input(s): LIPASE, AMYLASE in the last 168 hours. No results for input(s): AMMONIA in the last 168 hours. Coagulation Profile: No results for input(s): INR, PROTIME in the last 168  hours. Cardiac Enzymes: No results for input(s): CKTOTAL, CKMB, CKMBINDEX, TROPONINI in the last 168 hours. BNP (last 3 results) No results for input(s): PROBNP in the last 8760 hours. HbA1C: No results for input(s): HGBA1C in the last 72 hours. CBG: Recent Labs  Lab 08/05/20 2338  GLUCAP 108*   Lipid Profile: No results for input(s): CHOL, HDL, LDLCALC, TRIG, CHOLHDL, LDLDIRECT in the last 72 hours. Thyroid Function Tests: No results for input(s): TSH, T4TOTAL, FREET4, T3FREE, THYROIDAB in the last 72 hours. Anemia Panel: No results for input(s): VITAMINB12, FOLATE, FERRITIN, TIBC, IRON, RETICCTPCT in the last 72 hours. Urine analysis:    Component Value Date/Time   COLORURINE AMBER (A) 08/06/2020 1041   APPEARANCEUR CLOUDY (A) 08/06/2020 1041   LABSPEC 1.020 08/06/2020 1041   PHURINE 5.0 08/06/2020 1041  GLUCOSEU NEGATIVE 08/06/2020 [place] 05/21/2015 1632   HGBUR SMALL (A) 08/06/2020 1041   BILIRUBINUR SMALL (A) 08/06/2020 1041   KETONESUR 5 (A) 08/06/2020 1041   PROTEINUR 100 (A) 08/06/2020 1041   UROBILINOGEN 0.2 05/21/2015 1632   NITRITE NEGATIVE 08/06/2020 1041   LEUKOCYTESUR NEGATIVE 08/06/2020 1041   Sepsis Labs: @LABRCNTIP (procalcitonin:4,lacticacidven:4)  ) Recent Results (from the past 240 hour(s))  Resp Panel by RT-PCR (Flu A&B, Covid) Nasopharyngeal Swab     Status: None   Collection Time: 08/04/20  2:07 PM   Specimen: Nasopharyngeal Swab; Nasopharyngeal(NP) swabs in vial transport medium  Result Value Ref Range Status   SARS Coronavirus 2 by RT PCR NEGATIVE NEGATIVE Final    Comment: (NOTE) SARS-CoV-2 target nucleic acids are NOT DETECTED.  The SARS-CoV-2 RNA is generally detectable in upper respiratory specimens during the acute phase of infection. The lowest concentration of SARS-CoV-2 viral copies this assay can detect is 138 copies/mL. A negative result does not preclude SARS-Cov-2 infection and should not be used as the sole  basis for treatment or other patient management decisions. A negative result may occur with  improper specimen collection/handling, submission of specimen other than nasopharyngeal swab, presence of viral mutation(s) within the areas targeted by this assay, and inadequate number of viral copies(<138 copies/mL). A negative result must be combined with clinical observations, patient history, and epidemiological information. The expected result is Negative.  Fact Sheet for Patients:  EntrepreneurPulse.com.au  Fact Sheet for Healthcare Providers:  IncredibleEmployment.be  This test is no t yet approved or cleared by the [place] FDA and  has been authorized for detection and/or diagnosis of SARS-CoV-2 by FDA under an Emergency Use Authorization (EUA). This EUA will remain  in effect (meaning this test can be used) for the duration of the COVID-19 declaration under Section 564(b)(1) of the Act, 21 U.S.C.section 360bbb-3(b)(1), unless the authorization is terminated  or revoked sooner.       Influenza A by PCR NEGATIVE NEGATIVE Final   Influenza B by PCR NEGATIVE NEGATIVE Final    Comment: (NOTE) The Xpert Xpress SARS-CoV-2/FLU/RSV plus assay is intended as an aid in the diagnosis of influenza from Nasopharyngeal swab specimens and should not be used as a sole basis for treatment. Nasal washings and aspirates are unacceptable for Xpert Xpress SARS-CoV-2/FLU/RSV testing.  Fact Sheet for Patients: EntrepreneurPulse.com.au  Fact Sheet for Healthcare Providers: IncredibleEmployment.be  This test is not yet approved or cleared by the [place] FDA and has been authorized for detection and/or diagnosis of SARS-CoV-2 by FDA under an Emergency Use Authorization (EUA). This EUA will remain in effect (meaning this test can be used) for the duration of the COVID-19 declaration under Section 564(b)(1) of the Act,  21 U.S.C. section 360bbb-3(b)(1), unless the authorization is terminated or revoked.  Performed at [facility], [place] [address], [place], [place] [address]   Culture, blood (routine x 2)     Status: Abnormal   Collection Time: 08/05/20  9:31 PM   Specimen: BLOOD  Result Value Ref Range Status   Specimen Description BLOOD SITE NOT SPECIFIED  Final   Special Requests   Final    BOTTLES DRAWN AEROBIC ONLY Blood Culture results may not be optimal due to an inadequate volume of blood received in culture bottles   Culture  Setup Time   Final    GRAM NEGATIVE RODS AEROBIC BOTTLE ONLY CRITICAL VALUE NOTED.  VALUE IS CONSISTENT WITH PREVIOUSLY REPORTED AND CALLED VALUE.  Culture (A)  Final    ESCHERICHIA COLI SUSCEPTIBILITIES PERFORMED ON PREVIOUS CULTURE WITHIN THE LAST 5 DAYS. Performed at [facility], [place] [address] [place], [place] [address]    Report Status 08/08/2020 FINAL  Final  Culture, blood (routine x 2)     Status: Abnormal   Collection Time: 08/05/20  9:31 PM   Specimen: BLOOD  Result Value Ref Range Status   Specimen Description BLOOD SITE NOT SPECIFIED  Final   Special Requests   Final    BOTTLES DRAWN AEROBIC ONLY Blood Culture adequate volume   Culture  Setup Time   Final    GRAM NEGATIVE RODS AEROBIC BOTTLE ONLY Organism ID to follow CRITICAL RESULT CALLED TO, READ BACK BY AND VERIFIED WITH: A. Rogers Blocker PharmD 10:10 08/06/20 ([email or web address]) Performed at [facility], [place] [address] [place], [place] [address]    Culture ESCHERICHIA COLI (A)  Final   Report Status 08/08/2020 FINAL  Final   Organism ID, Bacteria ESCHERICHIA COLI  Final      Susceptibility   Escherichia coli - MIC*    AMPICILLIN 4 SENSITIVE Sensitive     CEFAZOLIN <=4 SENSITIVE Sensitive     CEFEPIME <=0.12 SENSITIVE Sensitive     CEFTAZIDIME <=1 SENSITIVE Sensitive     CEFTRIAXONE <=0.25 SENSITIVE Sensitive     CIPROFLOXACIN >=4 RESISTANT Resistant     GENTAMICIN <=1 SENSITIVE  Sensitive     IMIPENEM <=0.25 SENSITIVE Sensitive     TRIMETH/SULFA <=20 SENSITIVE Sensitive     AMPICILLIN/SULBACTAM <=2 SENSITIVE Sensitive     PIP/TAZO <=4 SENSITIVE Sensitive     * ESCHERICHIA COLI  Blood Culture ID Panel (Reflexed)     Status: Abnormal   Collection Time: 08/05/20  9:31 PM  Result Value Ref Range Status   Enterococcus faecalis NOT DETECTED NOT DETECTED Final   Enterococcus Faecium NOT DETECTED NOT DETECTED Final   Listeria monocytogenes NOT DETECTED NOT DETECTED Final   Staphylococcus species NOT DETECTED NOT DETECTED Final   Staphylococcus aureus (BCID) NOT DETECTED NOT DETECTED Final   Staphylococcus epidermidis NOT DETECTED NOT DETECTED Final   Staphylococcus lugdunensis NOT DETECTED NOT DETECTED Final   Streptococcus species NOT DETECTED NOT DETECTED Final   Streptococcus agalactiae NOT DETECTED NOT DETECTED Final   Streptococcus pneumoniae NOT DETECTED NOT DETECTED Final   Streptococcus pyogenes NOT DETECTED NOT DETECTED Final   A.calcoaceticus-baumannii NOT DETECTED NOT DETECTED Final   Bacteroides fragilis NOT DETECTED NOT DETECTED Final   Enterobacterales DETECTED (A) NOT DETECTED Final    Comment: Enterobacterales represent a large order of gram negative bacteria, not a single organism. CRITICAL RESULT CALLED TO, READ BACK BY AND VERIFIED WITH: A. Rogers Blocker PharmD 10:10 08/06/20 ([email or web address])    Enterobacter cloacae complex NOT DETECTED NOT DETECTED Final   Escherichia coli DETECTED (A) NOT DETECTED Final    Comment: CRITICAL RESULT CALLED TO, READ BACK BY AND VERIFIED WITH: A. Rogers Blocker PharmD 10:10 08/06/20 ([email or web address])    Klebsiella aerogenes NOT DETECTED NOT DETECTED Final   Klebsiella oxytoca NOT DETECTED NOT DETECTED Final   Klebsiella pneumoniae NOT DETECTED NOT DETECTED Final   Proteus species NOT DETECTED NOT DETECTED Final   Salmonella species NOT DETECTED NOT DETECTED Final   Serratia marcescens NOT DETECTED NOT DETECTED Final   Haemophilus influenzae NOT  DETECTED NOT DETECTED Final   Neisseria meningitidis NOT DETECTED NOT DETECTED Final   Pseudomonas aeruginosa NOT DETECTED NOT DETECTED Final   Stenotrophomonas maltophilia NOT DETECTED NOT DETECTED Final  Candida albicans NOT DETECTED NOT DETECTED Final   Candida auris NOT DETECTED NOT DETECTED Final   Candida glabrata NOT DETECTED NOT DETECTED Final   Candida krusei NOT DETECTED NOT DETECTED Final   Candida parapsilosis NOT DETECTED NOT DETECTED Final   Candida tropicalis NOT DETECTED NOT DETECTED Final   Cryptococcus neoformans/gattii NOT DETECTED NOT DETECTED Final   CTX-M ESBL NOT DETECTED NOT DETECTED Final   Carbapenem resistance IMP NOT DETECTED NOT DETECTED Final   Carbapenem resistance KPC NOT DETECTED NOT DETECTED Final   Carbapenem resistance NDM NOT DETECTED NOT DETECTED Final   Carbapenem resist OXA 48 LIKE NOT DETECTED NOT DETECTED Final   Carbapenem resistance VIM NOT DETECTED NOT DETECTED Final    Comment: Performed at [facility], [address], [place], [place] [address]  Culture, Urine     Status: Abnormal   Collection Time: 08/06/20  1:14 PM   Specimen: Urine, Random  Result Value Ref Range Status   Specimen Description URINE, RANDOM  Final   Special Requests   Final    NONE Performed at [facility], [place] [address], [place], [place] [address]    Culture MULTIPLE SPECIES PRESENT, SUGGEST RECOLLECTION (A)  Final   Report Status 08/07/2020 FINAL  Final         Radiology Studies: US Abdomen Complete  Result Date: 08/11/2020 CLINICAL DATA:  Hyperbilirubinemia EXAM: ABDOMEN ULTRASOUND COMPLETE COMPARISON:  Renal ultrasound 08/07/2020 and CT abdomen from 05/27/2015 FINDINGS: Gallbladder: Cholelithiasis is present including a 0.8 cm gallstone. Sludge is present in the gallbladder. Mild gallbladder wall thickening at 0.4 cm. No pericholecystic fluid. Sonographic Murphy's sign absent. Common bile duct: Diameter: 0.7 cm Liver: No focal lesion  identified. Within normal limits in parenchymal echogenicity. Portal vein is patent on color Doppler imaging with normal direction of blood flow towards the liver. IVC: No abnormality visualized. Pancreas: Not well seen due to overlying bowel gas Spleen: Size and appearance within normal limits. Right Kidney: Length: 10.4 cm. Echogenicity within normal limits. No solid mass or hydronephrosis visualized. At least 2 small right renal cysts are present, one approximately 0.6 cm in diameter and the other 0.8 cm in diameter. Left Kidney: Length: 11.2 cm. Echogenicity within normal limits. No solid mass or hydronephrosis visualized. A 2.0 by 1.7 by 2.0 cm left mid to lower renal cyst is present and has been shown on prior exams. Abdominal aorta: No aneurysm visualized.  Atherosclerosis noted. Other findings: Suspected bilateral pleural effusions. IMPRESSION: 1. Cholelithiasis and gallbladder sludge. Mild gallbladder wall thickening at 0.4 cm. No pericholecystic fluid or sonographic Murphy sign. Correlate clinically in assessing for acute cholecystitis. If assessment for patency of the cystic duct is warranted, nuclear medicine hepatobiliary scan may be helpful. 2. Common bile duct measures 0.7 cm in diameter, within normal limits for age. 3. Suspected bilateral pleural effusions. 4. Bilateral renal cysts. 5.  Aortic Atherosclerosis (ICD10-I70.0). 6. The pancreas is not well seen due to overlying bowel gas. Electronically Signed   By: Van Clines M.D.   On: 08/11/2020 13:47        Scheduled Meds: . aspirin  325 mg Oral Daily  . atorvastatin  40 mg Oral Daily  . carbidopa-levodopa  3 tablet Oral BID  . clopidogrel  75 mg Oral Daily  . feeding supplement  237 mL Oral TID BM  . [START ON 08/12/2020] midodrine  2.5 mg Oral TID WC  . multivitamin with minerals  1 tablet Oral Daily  . pantoprazole  sodium  40 mg Oral Daily  . polyethylene glycol  17 g Oral Daily  . potassium chloride  40 mEq Oral Once  .  primidone  150 mg Oral BID  . umeclidinium bromide  1 puff Inhalation Daily  . venlafaxine  37.5 mg Oral BID  . vitamin B-12  1,000 mcg Oral Daily   Continuous Infusions: .  ceFAZolin (ANCEF) IV 2 g (08/11/20 0558)  . lactated ringers 100 mL/hr at 08/11/20 0350     LOS: 15 days    Time spent: 35 minutes    Edwin Dada, MD [facility] 08/11/2020, 5:18 PM     Please page though [place] or Epic secure chat:  For [employer], Adult nurse

## 2020-08-11 NOTE — Progress Notes (Signed)
Physical Therapy Treatment Patient Details Name: Jeffrey Blair MRN: [id number] DOB: March 28, 1928 Today's Date: 08/11/2020    History of Present Illness 87 yo male admitted to [facility] from [place] for AMS. CTA demonstrates R proximal MCA posterior division occlusion with distal reconstitution, severe stenosis of proximal basilar and P2 of R PCA. MRI Head 07/27/20 demonstrates acute to subacute infarct in the posterior right MCA territory  infarct, and especially at the right MCA/PCA watershed. Pt and family left AMA, returned. PMH includes parkinson's disease, CVA, prostate cancer, TIA, migraines, cervical disc disease, dementia.    PT Comments    Patient irritated upon arrival ranting about many things, tangential mostly and unrelated to questions asked. Perseverating on wanting water however every time he is offered it, moves head away, "what is wrong with you people?" Pt resisting movement today and requires total A for bed mobility with anterior trunk lean once EOB requiring Max A for support. Pt not following commands today. Attempted there ex of LEs however pt resisting movement. Will continue to follow and attempt progress but unlikely due to cognitive deficits. Continue to recommend SNF.    Follow Up Recommendations  SNF;Supervision/Assistance - 24 hour;Supervision for mobility/OOB     Equipment Recommendations   (lift)    Recommendations for Other Services       Precautions / Restrictions Precautions Precautions: Fall Restrictions Weight Bearing Restrictions: No    Mobility  Bed Mobility Overal bed mobility: Needs Assistance Bed Mobility: Supine to Sit;Sit to Supine     Supine to sit: Total assist;HOB elevated Sit to supine: Total assist;HOB elevated   General bed mobility comments: total A for bed mobility as pt not initiating any movement and resisting at times.  Transfers                 General transfer comment: Deferred for  safety.  Ambulation/Gait                 Stairs             Wheelchair Mobility    Modified Rankin (Stroke Patients Only) Modified Rankin (Stroke Patients Only) Pre-Morbid Rankin Score: Moderate disability Modified Rankin: Severe disability     Balance Overall balance assessment: Needs assistance Sitting-balance support: Feet unsupported;Bilateral upper extremity supported Sitting balance-Leahy Scale: Zero Sitting balance - Comments: total A to sit EOB with anterior trunk lean, no awareness of balance deficits, "give me the water"                                    Cognition Arousal/Alertness: Awake/alert Behavior During Therapy:  (irritated) Overall Cognitive Status: History of cognitive impairments - at baseline                                 General Comments: Baseline dementia. pt not following any commands this session. Perseverating on wanting water however when given to pt, he turns his head away, "what is wrong with you people" Ranting, tangential "give me a gun to shoot this old man dead."      Exercises General Exercises - Lower Extremity Heel Slides: AAROM;Both;5 reps (with pt resisting)    General Comments        Pertinent Vitals/Pain Pain Assessment: Faces Faces Pain Scale: No hurt    Home Living  Prior Function            PT Goals (current goals can now be found in the care plan section) Progress towards PT goals: Not progressing toward goals - comment (limited by cognition and command following)    Frequency    Min 2X/week      PT Plan Current plan remains appropriate    Co-evaluation              AM-PAC PT "6 Clicks" Mobility   Outcome Measure  Help needed turning from your back to your side while in a flat bed without using bedrails?: Total Help needed moving from lying on your back to sitting on the side of a flat bed without using bedrails?: Total Help  needed moving to and from a bed to a chair (including a wheelchair)?: Total Help needed standing up from a chair using your arms (e.g., wheelchair or bedside chair)?: Total Help needed to walk in hospital room?: Total Help needed climbing 3-5 steps with a railing? : Total 6 Click Score: 6    End of Session   Activity Tolerance: Other (comment) (limited by cognition, unwillingness to participate) Patient left: in bed;with call bell/phone within reach;with bed alarm set Nurse Communication: Mobility status;Need for lift equipment PT Visit Diagnosis: Unsteadiness on feet (R26.81);Other abnormalities of gait and mobility (R26.89);Muscle weakness (generalized) (M62.81);Difficulty in walking, not elsewhere classified (R26.2);Other symptoms and signs involving the nervous system (K24.097)     Time: 3532-9924 PT Time Calculation (min) (ACUTE ONLY): 16 min  Charges:  $Therapeutic Activity: 8-22 mins                     Marisa Severin, PT, DPT Acute Rehabilitation Services Pager [phone] Office [phone]       Jeffrey Blair 08/11/2020, 10:10 AM

## 2020-08-12 DIAGNOSIS — R1310 Dysphagia, unspecified: Secondary | ICD-10-CM

## 2020-08-12 DIAGNOSIS — R627 Adult failure to thrive: Secondary | ICD-10-CM

## 2020-08-12 DIAGNOSIS — Z66 Do not resuscitate: Secondary | ICD-10-CM

## 2020-08-12 DIAGNOSIS — G20 Parkinson's disease: Secondary | ICD-10-CM | POA: Diagnosis not present

## 2020-08-12 DIAGNOSIS — I10 Essential (primary) hypertension: Secondary | ICD-10-CM | POA: Diagnosis not present

## 2020-08-12 DIAGNOSIS — Z515 Encounter for palliative care: Secondary | ICD-10-CM | POA: Diagnosis not present

## 2020-08-12 DIAGNOSIS — I639 Cerebral infarction, unspecified: Secondary | ICD-10-CM | POA: Diagnosis not present

## 2020-08-12 LAB — BASIC METABOLIC PANEL
Anion gap: 9 (ref 5–15)
BUN: 6 mg/dL — ABNORMAL LOW (ref 8–23)
CO2: 24 mmol/L (ref 22–32)
Calcium: 7.6 mg/dL — ABNORMAL LOW (ref 8.9–10.3)
Chloride: 99 mmol/L (ref 98–111)
Creatinine, Ser: 0.65 mg/dL (ref 0.61–1.24)
GFR, Estimated: >60 mL/min (ref >60)
Glucose, Bld: 93 mg/dL (ref 70–99)
Potassium: 3 mmol/L — ABNORMAL LOW (ref 3.5–5.1)
Sodium: 132 mmol/L — ABNORMAL LOW (ref 135–145)

## 2020-08-12 LAB — CBC
HCT: 31.9 % — ABNORMAL LOW (ref 39.0–52.0)
Hemoglobin: 11.2 g/dL — ABNORMAL LOW (ref 13.0–17.0)
MCH: 28.4 pg (ref 26.0–34.0)
MCHC: 35.1 g/dL (ref 30.0–36.0)
MCV: 80.8 fL (ref 80.0–100.0)
Platelets: 62 10*3/uL — ABNORMAL LOW (ref 150–400)
RBC: 3.95 MIL/uL — ABNORMAL LOW (ref 4.22–5.81)
RDW: 13.7 % (ref 11.5–15.5)
WBC: 16.4 10*3/uL — ABNORMAL HIGH (ref 4.0–10.5)
nRBC: 0 % (ref 0.0–0.2)

## 2020-08-12 MED ORDER — OXYCODONE HCL 5 MG PO TABS
5.0000 mg | ORAL_TABLET | ORAL | Status: DC | PRN
  Administered 2020-08-12: 5 mg via ORAL
  Filled 2020-08-12: qty 1

## 2020-08-12 MED ORDER — LORAZEPAM 1 MG PO TABS
1.0000 mg | ORAL_TABLET | ORAL | Status: DC | PRN
  Administered 2020-08-18 – 2020-08-25 (×2): 1 mg via ORAL
  Filled 2020-08-12 (×2): qty 1

## 2020-08-12 MED ORDER — LORAZEPAM 2 MG/ML IJ SOLN
1.0000 mg | INTRAMUSCULAR | Status: DC | PRN
  Administered 2020-08-12 – 2020-08-26 (×13): 1 mg via INTRAVENOUS
  Filled 2020-08-12 (×13): qty 1

## 2020-08-12 MED ORDER — LORAZEPAM 2 MG/ML PO CONC
1.0000 mg | ORAL | Status: DC | PRN
  Administered 2020-08-18 – 2020-08-22 (×2): 1 mg via SUBLINGUAL
  Filled 2020-08-12 (×2): qty 1

## 2020-08-12 MED ORDER — HYDROMORPHONE HCL 1 MG/ML IJ SOLN
0.5000 mg | INTRAMUSCULAR | Status: DC | PRN
  Administered 2020-08-14 – 2020-08-22 (×30): 0.5 mg via INTRAVENOUS
  Filled 2020-08-12 (×31): qty 0.5

## 2020-08-12 NOTE — Progress Notes (Signed)
[place] [facility] PROGRESS NOTE    [name]  OAC:[id number] DOB: [birth year] DOA: 07/25/2020 PCP: [facility]      Brief Narrative:  Jeffrey Blair is a 87 y.o. M with dementia, ambulatory and home dwelling, Parkinson's disease, prostate CA in remission, COPD and TIA brought due to confusion, speech disturbance.  Initially woke with new slurred speech, seen in the ER at [facility], waiting bed for admission for stroke when he became agitated and so family brought him home.  At home, he was altered and could not follow commands, so he was brought back to [facility], and transferred to [facility] due to stroke.   12/25 admitted for stroke, MRI showed acute large infarct involving approximately 7 cm are of right MCA/PCA territory 12/27-1/4 patient initially failed SLP evaluation; oral intake persistently very poor and not enough to keep up with hydration and nutrition needs, placement delayed 1/5 new hypotension, worsening hemiparesis, CT showed interval expansion of stroke --> resolved with fluids and midodrine 1/10 still without ability to tolerate significant food by mouth, LFTs rising 1/12 palliative meeting, family elect for residential hospice        Assessment & Plan:  Acute right MCA CVA Sepsis due to E coli bacteremia Transaminitis Parkinson's disease Depression COPD Hypokalemia Hyponatremia AKI Thrombocytopenia   Given his age, limited prior function, extent of new infarction, there is a high degree of medical certainty that he is now permanently bed bound or wheelchair bound.  Further, there is a very high chance that he would require a feeding tube for an extended period of time without a meaningful assurance to ever liberate from it.  In combination, this would most certainly require permanent skilled nurseing level of care from now on.  In light of that fact, family articulate clearly that this would not be the patients' wishes. This is  concordant with the patient's expressions to nursing, speech therapy, myself and son [name] that he wishes to die.  The compassionate care in this circumstance is to liberalize the patient's diet, transition him to home or a more home-like environment, surround with family and provide such comfort medicines as are needed  -Consult Hospice -Stop aspirin, Plavix, statin, IV fluids  -Continue Sinemet, primidone as these provide symptomatic relief -Conitnue antibiotics until d/c  -Start Ativan, hydromorphone as needed for pain or anxiety      Disposition: Status is: Inpatient  Remains inpatient appropriate because:IV treatments appropriate due to intensity of illness or inability to take PO   Dispo: The patient is from: Home              Anticipated d/c is to: inpatient Hospice              Anticipated d/c date is: 1 day              Patient currently is medically stable to d/c.              MDM: The below labs and imaging reports were reviewed and summarized above.  Medication management as above.    DVT prophylaxis:   Code Status: DNR Family Communication: son [name] and son [name] at bedside          Subjective: Patient requesting to "let me die".  Asking for water.  No fever or respiratory distress, no pain complaints.   Objective: Vitals:   08/12/20 0006 08/12/20 0408 08/12/20 0715 08/12/20 1500  BP: 136/75 137/83 137/77 134/74  Pulse: 95 93 100 100  Resp: 16 18 18 18   Temp: 97.8 F (36.6 C) 98.2 F (36.8 C) 98.5 F (36.9 C) 98.8 F (37.1 C)  TempSrc: Oral Oral Oral Oral  SpO2: 98% 98% 98% 98%  Weight:      Height:        Intake/Output Summary (Last 24 hours) at 08/12/2020 1706 Last data filed at 08/12/2020 0600 Gross per 24 hour  Intake 1250 ml  Output 900 ml  Net 350 ml   Filed Weights   07/25/20 1925  Weight: 72.6 kg    Examination: General appearance: Elderly adult male, lying in bed, debilitated     HEENT: Edentulous, lips dry,  oropharynx dry, no oral lesions Skin:  Cardiac: RRR, no murmurs, no lower extremity edema, but the left arm is swollen due to IV fluids Respiratory: Normal respiratory effort, lungs clear without rales or wheeze Abdomen: Abdomen soft without tenderness palpation or guarding, no rigidity or rebound MSK: No joint effusions, old deformity of the left shin, no new deformities, diffuse loss of subcutaneous muscle mass and fat Neuro: Awake and alert, asking for water, head rotated to the left, left hemiparesis, hypophonic but fluent Psych: Sensorium intact, responds to questions, makes eye contact, attention diminished, affect flat, judgment insight appear impaired    Data Reviewed: I have personally reviewed following labs and imaging studies:  CBC: Recent Labs  Lab 08/06/20 1143 08/07/20 0203 08/08/20 0425 08/11/20 0450 08/12/20 1101  WBC 22.4* 12.0* 15.4* 20.5* 16.4*  HGB 11.7* 11.6* 11.0* 11.3* 11.2*  HCT 35.4* 34.7* 32.4* 32.5* 31.9*  MCV 91.5 87.4 86.6 83.1 80.8  PLT 137* 122*  --  53* 62*   Basic Metabolic Panel: Recent Labs  Lab 08/07/20 0203 08/08/20 0425 08/09/20 0228 08/11/20 0450 08/12/20 1101  NA 135 137 133* 133* 132*  K 4.2 3.0* 4.3 3.4* 3.0*  CL 102 105 104 104 99  CO2 22 20* 16* 20* 24  GLUCOSE 75 68* 74 112* 93  BUN 39* 42* 32* 14 6*  CREATININE 2.13* 1.59* 1.07 0.67 0.65  CALCIUM 7.5* 7.3* 7.2* 7.5* 7.6*   GFR: Estimated Creatinine Clearance: 63 mL/min (by C-G formula based on SCr of 0.65 mg/dL). Liver Function Tests: Recent Labs  Lab 08/10/20 1455  AST 175*  ALT 68*  ALKPHOS 461*  BILITOT 5.3*  PROT 5.6*  ALBUMIN 2.1*   No results for input(s): LIPASE, AMYLASE in the last 168 hours. No results for input(s): AMMONIA in the last 168 hours. Coagulation Profile: No results for input(s): INR, PROTIME in the last 168 hours. Cardiac Enzymes: No results for input(s): CKTOTAL, CKMB, CKMBINDEX, TROPONINI in the last 168 hours. BNP (last 3  results) No results for input(s): PROBNP in the last 8760 hours. HbA1C: No results for input(s): HGBA1C in the last 72 hours. CBG: Recent Labs  Lab 08/05/20 2338  GLUCAP 108*   Lipid Profile: No results for input(s): CHOL, HDL, LDLCALC, TRIG, CHOLHDL, LDLDIRECT in the last 72 hours. Thyroid Function Tests: No results for input(s): TSH, T4TOTAL, FREET4, T3FREE, THYROIDAB in the last 72 hours. Anemia Panel: No results for input(s): VITAMINB12, FOLATE, FERRITIN, TIBC, IRON, RETICCTPCT in the last 72 hours. Urine analysis:    Component Value Date/Time   COLORURINE AMBER (A) 08/06/2020 1041   APPEARANCEUR CLOUDY (A) 08/06/2020 1041   LABSPEC 1.020 08/06/2020 1041   PHURINE 5.0 08/06/2020 1041   GLUCOSEU NEGATIVE 08/06/2020 1041   GLUCOSEU NEGATIVE 05/21/2015 1632   HGBUR SMALL (A) 08/06/2020 1041   BILIRUBINUR SMALL (A)  08/06/2020 1041   KETONESUR 5 (A) 08/06/2020 1041   PROTEINUR 100 (A) 08/06/2020 1041   UROBILINOGEN 0.2 05/21/2015 1632   NITRITE NEGATIVE 08/06/2020 1041   LEUKOCYTESUR NEGATIVE 08/06/2020 1041   Sepsis Labs: @LABRCNTIP (procalcitonin:4,lacticacidven:4)  ) Recent Results (from the past 240 hour(s))  Resp Panel by RT-PCR (Flu A&B, Covid) Nasopharyngeal Swab     Status: None   Collection Time: 08/04/20  2:07 PM   Specimen: Nasopharyngeal Swab; Nasopharyngeal(NP) swabs in vial transport medium  Result Value Ref Range Status   SARS Coronavirus 2 by RT PCR NEGATIVE NEGATIVE Final    Comment: (NOTE) SARS-CoV-2 target nucleic acids are NOT DETECTED.  The SARS-CoV-2 RNA is generally detectable in upper respiratory specimens during the acute phase of infection. The lowest concentration of SARS-CoV-2 viral copies this assay can detect is 138 copies/mL. A negative result does not preclude SARS-Cov-2 infection and should not be used as the sole basis for treatment or other patient management decisions. A negative result may occur with  improper specimen  collection/handling, submission of specimen other than nasopharyngeal swab, presence of viral mutation(s) within the areas targeted by this assay, and inadequate number of viral copies(<138 copies/mL). A negative result must be combined with clinical observations, patient history, and epidemiological information. The expected result is Negative.  Fact Sheet for Patients:  EntrepreneurPulse.com.au  Fact Sheet for Healthcare Providers:  IncredibleEmployment.be  This test is no t yet approved or cleared by the [place] FDA and  has been authorized for detection and/or diagnosis of SARS-CoV-2 by FDA under an Emergency Use Authorization (EUA). This EUA will remain  in effect (meaning this test can be used) for the duration of the COVID-19 declaration under Section 564(b)(1) of the Act, 21 U.S.C.section 360bbb-3(b)(1), unless the authorization is terminated  or revoked sooner.       Influenza A by PCR NEGATIVE NEGATIVE Final   Influenza B by PCR NEGATIVE NEGATIVE Final    Comment: (NOTE) The Xpert Xpress SARS-CoV-2/FLU/RSV plus assay is intended as an aid in the diagnosis of influenza from Nasopharyngeal swab specimens and should not be used as a sole basis for treatment. Nasal washings and aspirates are unacceptable for Xpert Xpress SARS-CoV-2/FLU/RSV testing.  Fact Sheet for Patients: EntrepreneurPulse.com.au  Fact Sheet for Healthcare Providers: IncredibleEmployment.be  This test is not yet approved or cleared by the [place] FDA and has been authorized for detection and/or diagnosis of SARS-CoV-2 by FDA under an Emergency Use Authorization (EUA). This EUA will remain in effect (meaning this test can be used) for the duration of the COVID-19 declaration under Section 564(b)(1) of the Act, 21 U.S.C. section 360bbb-3(b)(1), unless the authorization is terminated or revoked.  Performed at [facility], [place] [address], [place], [place] [address]   Culture, blood (routine x 2)     Status: Abnormal   Collection Time: 08/05/20  9:31 PM   Specimen: BLOOD  Result Value Ref Range Status   Specimen Description BLOOD SITE NOT SPECIFIED  Final   Special Requests   Final    BOTTLES DRAWN AEROBIC ONLY Blood Culture results may not be optimal due to an inadequate volume of blood received in culture bottles   Culture  Setup Time   Final    GRAM NEGATIVE RODS AEROBIC BOTTLE ONLY CRITICAL VALUE NOTED.  VALUE IS CONSISTENT WITH PREVIOUSLY REPORTED AND CALLED VALUE.    Culture (A)  Final    ESCHERICHIA COLI SUSCEPTIBILITIES PERFORMED ON PREVIOUS CULTURE WITHIN THE LAST 5 DAYS.  Performed at [facility], [place] [address] [place], [place] [address]    Report Status 08/08/2020 FINAL  Final  Culture, blood (routine x 2)     Status: Abnormal   Collection Time: 08/05/20  9:31 PM   Specimen: BLOOD  Result Value Ref Range Status   Specimen Description BLOOD SITE NOT SPECIFIED  Final   Special Requests   Final    BOTTLES DRAWN AEROBIC ONLY Blood Culture adequate volume   Culture  Setup Time   Final    GRAM NEGATIVE RODS AEROBIC BOTTLE ONLY Organism ID to follow CRITICAL RESULT CALLED TO, READ BACK BY AND VERIFIED WITH: A. Rogers Blocker PharmD 10:10 08/06/20 ([email or web address]) Performed at [facility], [place] [address], [place], [place] [address]    Culture ESCHERICHIA COLI (A)  Final   Report Status 08/08/2020 FINAL  Final   Organism ID, Bacteria ESCHERICHIA COLI  Final      Susceptibility   Escherichia coli - MIC*    AMPICILLIN 4 SENSITIVE Sensitive     CEFAZOLIN <=4 SENSITIVE Sensitive     CEFEPIME <=0.12 SENSITIVE Sensitive     CEFTAZIDIME <=1 SENSITIVE Sensitive     CEFTRIAXONE <=0.25 SENSITIVE Sensitive     CIPROFLOXACIN >=4 RESISTANT Resistant     GENTAMICIN <=1 SENSITIVE Sensitive     IMIPENEM <=0.25 SENSITIVE Sensitive     TRIMETH/SULFA <=20 SENSITIVE Sensitive      AMPICILLIN/SULBACTAM <=2 SENSITIVE Sensitive     PIP/TAZO <=4 SENSITIVE Sensitive     * ESCHERICHIA COLI  Blood Culture ID Panel (Reflexed)     Status: Abnormal   Collection Time: 08/05/20  9:31 PM  Result Value Ref Range Status   Enterococcus faecalis NOT DETECTED NOT DETECTED Final   Enterococcus Faecium NOT DETECTED NOT DETECTED Final   Listeria monocytogenes NOT DETECTED NOT DETECTED Final   Staphylococcus species NOT DETECTED NOT DETECTED Final   Staphylococcus aureus (BCID) NOT DETECTED NOT DETECTED Final   Staphylococcus epidermidis NOT DETECTED NOT DETECTED Final   Staphylococcus lugdunensis NOT DETECTED NOT DETECTED Final   Streptococcus species NOT DETECTED NOT DETECTED Final   Streptococcus agalactiae NOT DETECTED NOT DETECTED Final   Streptococcus pneumoniae NOT DETECTED NOT DETECTED Final   Streptococcus pyogenes NOT DETECTED NOT DETECTED Final   A.calcoaceticus-baumannii NOT DETECTED NOT DETECTED Final   Bacteroides fragilis NOT DETECTED NOT DETECTED Final   Enterobacterales DETECTED (A) NOT DETECTED Final    Comment: Enterobacterales represent a large order of gram negative bacteria, not a single organism. CRITICAL RESULT CALLED TO, READ BACK BY AND VERIFIED WITH: A. Rogers Blocker PharmD 10:10 08/06/20 ([email or web address])    Enterobacter cloacae complex NOT DETECTED NOT DETECTED Final   Escherichia coli DETECTED (A) NOT DETECTED Final    Comment: CRITICAL RESULT CALLED TO, READ BACK BY AND VERIFIED WITH: A. Rogers Blocker PharmD 10:10 08/06/20 ([email or web address])    Klebsiella aerogenes NOT DETECTED NOT DETECTED Final   Klebsiella oxytoca NOT DETECTED NOT DETECTED Final   Klebsiella pneumoniae NOT DETECTED NOT DETECTED Final   Proteus species NOT DETECTED NOT DETECTED Final   Salmonella species NOT DETECTED NOT DETECTED Final   Serratia marcescens NOT DETECTED NOT DETECTED Final   Haemophilus influenzae NOT DETECTED NOT DETECTED Final   Neisseria meningitidis NOT DETECTED NOT DETECTED Final   Pseudomonas  aeruginosa NOT DETECTED NOT DETECTED Final   Stenotrophomonas maltophilia NOT DETECTED NOT DETECTED Final   Candida albicans NOT DETECTED NOT DETECTED Final   Candida auris NOT DETECTED NOT DETECTED Final  Candida glabrata NOT DETECTED NOT DETECTED Final   Candida krusei NOT DETECTED NOT DETECTED Final   Candida parapsilosis NOT DETECTED NOT DETECTED Final   Candida tropicalis NOT DETECTED NOT DETECTED Final   Cryptococcus neoformans/gattii NOT DETECTED NOT DETECTED Final   CTX-M ESBL NOT DETECTED NOT DETECTED Final   Carbapenem resistance IMP NOT DETECTED NOT DETECTED Final   Carbapenem resistance KPC NOT DETECTED NOT DETECTED Final   Carbapenem resistance NDM NOT DETECTED NOT DETECTED Final   Carbapenem resist OXA 48 LIKE NOT DETECTED NOT DETECTED Final   Carbapenem resistance VIM NOT DETECTED NOT DETECTED Final    Comment: Performed at [facility], [place] [address], [place], [place] [address]  Culture, Urine     Status: Abnormal   Collection Time: 08/06/20  1:14 PM   Specimen: Urine, Random  Result Value Ref Range Status   Specimen Description URINE, RANDOM  Final   Special Requests   Final    NONE Performed at [facility], [place] [address] [place], [place] [address]    Culture MULTIPLE SPECIES PRESENT, SUGGEST RECOLLECTION (A)  Final   Report Status 08/07/2020 FINAL  Final         Radiology Studies: US Abdomen Complete  Result Date: 08/11/2020 CLINICAL DATA:  Hyperbilirubinemia EXAM: ABDOMEN ULTRASOUND COMPLETE COMPARISON:  Renal ultrasound 08/07/2020 and CT abdomen from 05/27/2015 FINDINGS: Gallbladder: Cholelithiasis is present including a 0.8 cm gallstone. Sludge is present in the gallbladder. Mild gallbladder wall thickening at 0.4 cm. No pericholecystic fluid. Sonographic Murphy's sign absent. Common bile duct: Diameter: 0.7 cm Liver: No focal lesion identified. Within normal limits in parenchymal echogenicity. Portal vein is patent on color Doppler imaging  with normal direction of blood flow towards the liver. IVC: No abnormality visualized. Pancreas: Not well seen due to overlying bowel gas Spleen: Size and appearance within normal limits. Right Kidney: Length: 10.4 cm. Echogenicity within normal limits. No solid mass or hydronephrosis visualized. At least 2 small right renal cysts are present, one approximately 0.6 cm in diameter and the other 0.8 cm in diameter. Left Kidney: Length: 11.2 cm. Echogenicity within normal limits. No solid mass or hydronephrosis visualized. A 2.0 by 1.7 by 2.0 cm left mid to lower renal cyst is present and has been shown on prior exams. Abdominal aorta: No aneurysm visualized.  Atherosclerosis noted. Other findings: Suspected bilateral pleural effusions. IMPRESSION: 1. Cholelithiasis and gallbladder sludge. Mild gallbladder wall thickening at 0.4 cm. No pericholecystic fluid or sonographic Murphy sign. Correlate clinically in assessing for acute cholecystitis. If assessment for patency of the cystic duct is warranted, nuclear medicine hepatobiliary scan may be helpful. 2. Common bile duct measures 0.7 cm in diameter, within normal limits for age. 3. Suspected bilateral pleural effusions. 4. Bilateral renal cysts. 5.  Aortic Atherosclerosis (ICD10-I70.0). 6. The pancreas is not well seen due to overlying bowel gas. Electronically Signed   By: Van Clines M.D.   On: 08/11/2020 13:47        Scheduled Meds: . carbidopa-levodopa  3 tablet Oral BID  . feeding supplement  237 mL Oral TID BM  . midodrine  2.5 mg Oral TID WC  . pantoprazole sodium  40 mg Oral Daily  . polyethylene glycol  17 g Oral Daily  . potassium chloride  40 mEq Oral Once  . primidone  150 mg Oral BID   Continuous Infusions: .  ceFAZolin (ANCEF) IV 2 g (08/12/20 0554)     LOS: 16 days  Time spent: 35 minutes    Edwin Dada, MD [facility] 08/12/2020, 5:06 PM     Please page though [place] or Epic secure chat:  For  [employer], Adult nurse

## 2020-08-12 NOTE — TOC Progression Note (Signed)
Transition of Care [facility]) - Progression Note    Patient Details  Name: Jeffrey Blair MRN: [id number] Date of Birth: 11-Nov-1927  [facility] CM/SW Contact  Pollie Friar, RN Phone Number: 08/12/2020, 3:49 PM  Clinical Narrative:    CM met with 2 of the patients sons, palliative care and MD. Decision per sons was for residential hospice in [place]. CM has called and made referral to [place] at [place] following.   Expected Discharge Plan: [place] Barriers to Discharge: Continued Medical Work up  Expected Discharge Plan and Services Expected Discharge Plan: [place] In-house Referral: Clinical Social Work   Post Acute Care Choice: [place] Living arrangements for the past 2 months: Single Family Home                                       Social Determinants of Health (SDOH) Interventions    Readmission Risk Interventions No flowsheet data found.

## 2020-08-12 NOTE — Progress Notes (Signed)
Patient ID: Jeffrey Blair, male   DOB: 1927/08/05, 87 y.o.   MRN: [id number]  This NP visited patient at the bedside as a follow up for palliative medicine needs and to meet as scheduled with family ( sons [name] and [name]) for continued conversation regarding current medical situation.  Patient continues to fail to thrive.  Dysphagia, poor po intake. Garbled speech and unable to follow commands  Elevated WBC.  Total care for all ADLs.   Sons report that patient had been declining physically, functionally and cognitively prior to this admission.  Created space and opportunity for family to explore their thoughts and feelings regarding the patient's 's current medical situation.  Family verbalize an understanding of the seriousness of their father's condition and the likely limited prognosis.  Education and conversation regarding human mortality and the limitations of medical interventions to prolong quality of life when a body fails to thrive.  Education regarding the difference between an aggressive medical intervention path and a comfort path was detailed.   Education regarding hospice benefit and philosophy.  Detailed services both in the home and residential offered.  Both sons verbalize that comfort and dignity are the main priority of care.  Education regarding the natural trajectory and expectations at EOL was offered. Detailed conversation around artifical feeding and hydration.  Questions and concerns addressed.  Plan of Care: -Focus of care is comfort and dignity,  allowing for a natural death -DNR/DNI -no artifical feeding or hydration now or in the future, comfort feeds as tolerated  -symptom managment  -no further diagnostics, Iv antibiotic use -residential hospice for EOL care, family request [facility]  Prognosis is less than 2 weeks  Emotional support offered.   MOST form completed to reflect full comfort  Questions and concerns addressed   Discussed with Dr Loleta Books  and Kelly/CMRN/TOC team  Total time spent on the unit was 60 minutes  Greater than 50% of the time was spent in counseling and coordination of care  Wadie Lessen NP  Palliative Medicine Team Team Phone # [phone] Pager [phone]

## 2020-08-12 NOTE — Progress Notes (Signed)
  Speech Language Pathology Treatment: Dysphagia  Patient Details Name: Jeffrey Blair MRN: [id number] DOB: 1928/05/08 Today's Date: 08/12/2020 Time: 6808-8110 SLP Time Calculation (min) (ACUTE ONLY): 14 min  Assessment / Plan / Recommendation Clinical Impression  Pt was seen for treatment. He was alert and moderately cooperative during the session. He requested water and refused all other consistencies despite education and encouragement. He tolerated thin liquids without overt s/sx of aspiration. Anterior spillage was noted with thin liquids via cup. Speech intelligibility was reduced due to pt's reduced articulatory precision and requests for clarification were often needed. He was educated regarding compensatory strategies for speech intelligibility, but did not implement any of them despite verbal prompts. Pt expressed multiple times (at least 10 times) during this session that he would like to jump off of a building, that he intends to jump off of a building, or that he would like to be taken to the roof of this building so that he can jump off. Pt's RN, Kasandra Knudsen, and pt's attending, Dr. Loleta Books, both advised of this. It is recommended that the pt's current diet be continued and SLP will continue to follow pt.    HPI HPI: Jeffrey Blair is a 87 y.o. male with medical history significant of prostate cancer in remission, history of other nonhemorrhagic CVA, TIA, Parkinson's disease, migraine headaches, cervical disc disease, dementia who is brought to the emergency department by his son from [place] after he was taken due to [place] secondary to acute other nonhemorrhagic CVA. MRI Head 07/27/20: Acute to subacute infarct in the posterior right MCA territory  infarct, and especially at the right MCA/PCA watershed (fetal type PCA origins). Palliative Medicine has been consulted and the current plan is to "treat what is treatable".      SLP Plan  Continue with current plan of care        Recommendations  Diet recommendations: Dysphagia 1 (puree);Thin liquid Liquids provided via: Cup;Straw Medication Administration: Crushed with puree Supervision: Staff to assist with self feeding Compensations: Slow rate;Small sips/bites;Minimize environmental distractions Postural Changes and/or Swallow Maneuvers: Seated upright 90 degrees                Oral Care Recommendations: Oral care BID Follow up Recommendations: Skilled Nursing facility SLP Visit Diagnosis: Dysphagia, oropharyngeal phase (R13.12) Plan: Continue with current plan of care       Amayia Ciano I. Hardin Negus, [place], [place] Office number [phone] Pager [place] 08/12/2020, 11:45 AM

## 2020-08-13 DIAGNOSIS — I639 Cerebral infarction, unspecified: Secondary | ICD-10-CM | POA: Diagnosis not present

## 2020-08-13 NOTE — Progress Notes (Signed)
Nutrition Brief Note  Chart reviewed. Pt now transitioning to comfort care.  No further nutrition interventions warranted at this time.  Please re-consult as needed.   Vikrant Pryce, MS, RD, LDN RD pager number and weekend/on-call pager number located in Amion.    

## 2020-08-13 NOTE — Progress Notes (Signed)
This RN noted pt gaggling and from the throat Pt repositioned in high fowlers position  With hob 45 degrees. Oral suction done and noted dark brown secretions in the canister. Mouth care done and pt made comfortable in bed. An amount of 150 cc noted. No further gargling noted pt respiration improved. . Will continue to monitor pt.

## 2020-08-13 NOTE — TOC Progression Note (Signed)
Transition of Care [facility]) - Progression Note    Patient Details  Name: Jeffrey Blair MRN: [id number] Date of Birth: 1927/10/09  Transition of Care [facility]) CM/SW Contact  Pollie Friar, RN Phone Number: 08/13/2020, 1:03 PM  Clinical Narrative:    Pt has been accepted to [facility] but they dont have a bed for him today. He is on the wait list. MD and family updated.  TOC following.  Expected Discharge Plan: [place] Barriers to Discharge: Continued Medical Work up  Expected Discharge Plan and Services Expected Discharge Plan: [place] In-house Referral: Clinical Social Work   Post Acute Care Choice: [place] Living arrangements for the past 2 months: Single Family Home                                       Social Determinants of Health (SDOH) Interventions    Readmission Risk Interventions No flowsheet data found.

## 2020-08-13 NOTE — Progress Notes (Signed)
OT Cancellation Note  Patient Details Name: Jeffrey Blair MRN: [id number] DOB: 29-Jul-1928   Cancelled Treatment:    Reason Eval/Treat Not Completed: Other (comment) (Transitioning to comfort care.) Plan for hospice. Will sign off. Thank you.  [place], OTR/L Acute Rehab Pager: [phone] Office: [phone] 08/13/2020, 11:22 AM

## 2020-08-13 NOTE — Plan of Care (Signed)
  Problem: Health Behavior/Discharge Planning: Goal: Ability to manage health-related needs will improve Outcome: Progressing   Problem: Self-Care: Goal: Ability to communicate needs accurately will improve Outcome: Not Progressing   Problem: Nutrition: Goal: Dietary intake will improve Outcome: Not Progressing   Problem: Education: Goal: Knowledge of disease or condition will improve Outcome: Progressing

## 2020-08-13 NOTE — Progress Notes (Addendum)
PT Cancellation & Discharge Note  Patient Details Name: Jeffrey Blair MRN: [id number] DOB: 08-09-1927   Cancelled Treatment:    Reason Eval/Treat Not Completed: Patient not medically ready. Per chart, pt's family has decided to pursue comfort care and hospice services. PT will sign off at this time.   Moishe Spice, PT, DPT Acute Rehabilitation Services  Pager: [phone] Office: [place] 08/13/2020, 7:59 AM

## 2020-08-13 NOTE — Progress Notes (Signed)
SLP Cancellation Note  Patient Details Name: Jeffrey Blair MRN: [id number] DOB: [birth year]   Cancelled treatment:       Reason Eval/Treat Not Completed: Other (comment) (Per chart, pt's family has decided to pursue comfort care and hospice services. SLP will s/o)  Tobie Poet I. Hardin Negus, [place], [place] Office number [phone] Pager [employer] 08/13/2020, 10:24 AM

## 2020-08-13 NOTE — Progress Notes (Signed)
[place] [facility] PROGRESS NOTE    Jeffrey Blair  OHY:[id number] DOB: 10/06/1927 DOA: 07/25/2020 PCP: [facility]      Brief Narrative:  Jeffrey Blair is a 87 y.o. M with dementia, ambulatory and home dwelling, Parkinson's disease, prostate CA in remission, COPD and TIA brought due to confusion, speech disturbance.  Initially woke with new slurred speech, seen in the ER at [facility], waiting bed for admission for stroke when he became agitated and so family brought him home.  At home, he was altered and could not follow commands, so he was brought back to [facility], and transferred to [facility] due to stroke.         Assessment & Plan:  Acute right MCA CVA Sepsis due to E coli bacteremia Acute metabolic encephalopathy Transaminitis Parkinson's disease Depression COPD Hypokalemia Hyponatremia AKI Thrombocytopenia Admitted for stroke, MRI showed acute large infarct involving approximately 7 cm are of right MCA/PCA territory.  12/27-1/4 patient initially failed SLP evaluation and oral intake persistently very poor and not enough to keep up with hydration and nutrition needs, placement delayed.  On 1/5, there was new hypotension, worsening hemiparesis, CT showed interval expansion of stroke.  This resolved with fluids and midodrine.  However, the patient had progressive weakness, poor oral intake. On 1/12, there was a palliative meeting, family elected for residential hospice.  Fluids and antibiotics were stopped.   Given his age, limited prior function, extent of new infarction, there is a high degree of medical certainty that the patient is now permanently bed bound.  Further, there is a very high chance that he would require a feeding tube for an extended period of time without a meaningful assurance to ever liberate from it.  In combination, this would most certainly require permanent skilled nursing level of care from now on.  In light of that fact,  family articulate clearly that this would not be the patients' wishes. Despite what appears to be some delirium, this is concordant with the patient's expressions to nursing, speech therapy, myself and son Jeffrey Blair that he wishes to die.  The compassionate care in this circumstance is to liberalize the patient's diet, transition him to home or a more home-like environment, surround with family and provide such comfort medicines as are needed and allow a natural death.  - Full comfort measures  - Continue Sinemet, primidone, midodrine as these may provide some symptomatic relief  - Continue Ativan, hydromorphone as needed for pain or anxiety      Disposition: Status is: Inpatient  Remains inpatient appropriate because:IV treatments appropriate due to intensity of illness or inability to take PO   Dispo: The patient is from: Home              Anticipated d/c is to: inpatient Hospice              Anticipated d/c date is: 1 day              Patient currently is medically stable to d/c.              MDM: The below labs and imaging reports were reviewed and summarized above.  Medication management as above.    DVT prophylaxis:   Code Status: DNR Family Communication:            Subjective: No fever or respiratory distress, no pain complaints. Thirsty.  Objective: Vitals:   08/12/20 2036 08/13/20 0520 08/13/20 0756 08/13/20 1231  BP: (!) 141/66 120/61  132/61 130/76  Pulse: (!) 109 (!) 106 (!) 105 (!) 102  Resp: 17 20 18 18   Temp: 99.3 F (37.4 C) 98.6 F (37 C) 98.2 F (36.8 C) 98.2 F (36.8 C)  TempSrc: Oral Oral    SpO2: 98% 93% 96% 99%  Weight:      Height:        Intake/Output Summary (Last 24 hours) at 08/13/2020 1502 Last data filed at 08/13/2020 0500 Gross per 24 hour  Intake 260 ml  Output 700 ml  Net -440 ml   Filed Weights   07/25/20 1925  Weight: 72.6 kg    Examination: General appearance: Elderly adult male, lying in bed, appears  debilitated     HEENT: Edentulous, oropharynx dry Skin:  Cardiac: RRR, no murmurs, he has some encroaching peripheral edema Respiratory: Respiratory effort weak, lung sounds diminished, I do not appreciate rales or wheezing Abdomen:   MSK:  Neuro: Awake, makes eye contact, asked for water, dense left-sided hemiparesis, voice dysarthric, hard to understand Psych: Responds to questions and makes eye contact, but attention diminished, perseverates, does not consistently answer questions        Data Reviewed: I have personally reviewed following labs and imaging studies:  CBC: Recent Labs  Lab 08/07/20 0203 08/08/20 0425 08/11/20 0450 08/12/20 1101  WBC 12.0* 15.4* 20.5* 16.4*  HGB 11.6* 11.0* 11.3* 11.2*  HCT 34.7* 32.4* 32.5* 31.9*  MCV 87.4 86.6 83.1 80.8  PLT 122*  --  53* 62*   Basic Metabolic Panel: Recent Labs  Lab 08/07/20 0203 08/08/20 0425 08/09/20 0228 08/11/20 0450 08/12/20 1101  NA 135 137 133* 133* 132*  K 4.2 3.0* 4.3 3.4* 3.0*  CL 102 105 104 104 99  CO2 22 20* 16* 20* 24  GLUCOSE 75 68* 74 112* 93  BUN 39* 42* 32* 14 6*  CREATININE 2.13* 1.59* 1.07 0.67 0.65  CALCIUM 7.5* 7.3* 7.2* 7.5* 7.6*   GFR: Estimated Creatinine Clearance: 63 mL/min (by C-G formula based on SCr of 0.65 mg/dL). Liver Function Tests: Recent Labs  Lab 08/10/20 1455  AST 175*  ALT 68*  ALKPHOS 461*  BILITOT 5.3*  PROT 5.6*  ALBUMIN 2.1*   No results for input(s): LIPASE, AMYLASE in the last 168 hours. No results for input(s): AMMONIA in the last 168 hours. Coagulation Profile: No results for input(s): INR, PROTIME in the last 168 hours. Cardiac Enzymes: No results for input(s): CKTOTAL, CKMB, CKMBINDEX, TROPONINI in the last 168 hours. BNP (last 3 results) No results for input(s): PROBNP in the last 8760 hours. HbA1C: No results for input(s): HGBA1C in the last 72 hours. CBG: No results for input(s): GLUCAP in the last 168 hours. Lipid Profile: No results for  input(s): CHOL, HDL, LDLCALC, TRIG, CHOLHDL, LDLDIRECT in the last 72 hours. Thyroid Function Tests: No results for input(s): TSH, T4TOTAL, FREET4, T3FREE, THYROIDAB in the last 72 hours. Anemia Panel: No results for input(s): VITAMINB12, FOLATE, FERRITIN, TIBC, IRON, RETICCTPCT in the last 72 hours. Urine analysis:    Component Value Date/Time   COLORURINE AMBER (A) 08/06/2020 1041   APPEARANCEUR CLOUDY (A) 08/06/2020 1041   LABSPEC 1.020 08/06/2020 1041   PHURINE 5.0 08/06/2020 1041   GLUCOSEU NEGATIVE 08/06/2020 1041   GLUCOSEU NEGATIVE 05/21/2015 1632   HGBUR SMALL (A) 08/06/2020 1041   BILIRUBINUR SMALL (A) 08/06/2020 1041   KETONESUR 5 (A) 08/06/2020 1041   PROTEINUR 100 (A) 08/06/2020 1041   UROBILINOGEN 0.2 05/21/2015 1632   NITRITE NEGATIVE 08/06/2020 1041  LEUKOCYTESUR NEGATIVE 08/06/2020 1041   Sepsis Labs: @LABRCNTIP (procalcitonin:4,lacticacidven:4)  ) Recent Results (from the past 240 hour(s))  Resp Panel by RT-PCR (Flu A&B, Covid) Nasopharyngeal Swab     Status: None   Collection Time: 08/04/20  2:07 PM   Specimen: Nasopharyngeal Swab; Nasopharyngeal(NP) swabs in vial transport medium  Result Value Ref Range Status   SARS Coronavirus 2 by RT PCR NEGATIVE NEGATIVE Final    Comment: (NOTE) SARS-CoV-2 target nucleic acids are NOT DETECTED.  The SARS-CoV-2 RNA is generally detectable in upper respiratory specimens during the acute phase of infection. The lowest concentration of SARS-CoV-2 viral copies this assay can detect is 138 copies/mL. A negative result does not preclude SARS-Cov-2 infection and should not be used as the sole basis for treatment or other patient management decisions. A negative result may occur with  improper specimen collection/handling, submission of specimen other than nasopharyngeal swab, presence of viral mutation(s) within the areas targeted by this assay, and inadequate number of viral copies(<138 copies/mL). A negative result must  be combined with clinical observations, patient history, and epidemiological information. The expected result is Negative.  Fact Sheet for Patients:  EntrepreneurPulse.com.au  Fact Sheet for Healthcare Providers:  IncredibleEmployment.be  This test is no t yet approved or cleared by the [place] FDA and  has been authorized for detection and/or diagnosis of SARS-CoV-2 by FDA under an Emergency Use Authorization (EUA). This EUA will remain  in effect (meaning this test can be used) for the duration of the COVID-19 declaration under Section 564(b)(1) of the Act, 21 U.S.C.section 360bbb-3(b)(1), unless the authorization is terminated  or revoked sooner.       Influenza A by PCR NEGATIVE NEGATIVE Final   Influenza B by PCR NEGATIVE NEGATIVE Final    Comment: (NOTE) The Xpert Xpress SARS-CoV-2/FLU/RSV plus assay is intended as an aid in the diagnosis of influenza from Nasopharyngeal swab specimens and should not be used as a sole basis for treatment. Nasal washings and aspirates are unacceptable for Xpert Xpress SARS-CoV-2/FLU/RSV testing.  Fact Sheet for Patients: EntrepreneurPulse.com.au  Fact Sheet for Healthcare Providers: IncredibleEmployment.be  This test is not yet approved or cleared by the [place] FDA and has been authorized for detection and/or diagnosis of SARS-CoV-2 by FDA under an Emergency Use Authorization (EUA). This EUA will remain in effect (meaning this test can be used) for the duration of the COVID-19 declaration under Section 564(b)(1) of the Act, 21 U.S.C. section 360bbb-3(b)(1), unless the authorization is terminated or revoked.  Performed at [facility], [place] [address], [place], [place] [address]   Culture, blood (routine x 2)     Status: Abnormal   Collection Time: 08/05/20  9:31 PM   Specimen: BLOOD  Result Value Ref Range Status   Specimen Description BLOOD  SITE NOT SPECIFIED  Final   Special Requests   Final    BOTTLES DRAWN AEROBIC ONLY Blood Culture results may not be optimal due to an inadequate volume of blood received in culture bottles   Culture  Setup Time   Final    GRAM NEGATIVE RODS AEROBIC BOTTLE ONLY CRITICAL VALUE NOTED.  VALUE IS CONSISTENT WITH PREVIOUSLY REPORTED AND CALLED VALUE.    Culture (A)  Final    ESCHERICHIA COLI SUSCEPTIBILITIES PERFORMED ON PREVIOUS CULTURE WITHIN THE LAST 5 DAYS. Performed at [facility], [place] [address], [place], [place] [address]    Report Status 08/08/2020 FINAL  Final  Culture, blood (routine x 2)  Status: Abnormal   Collection Time: 08/05/20  9:31 PM   Specimen: BLOOD  Result Value Ref Range Status   Specimen Description BLOOD SITE NOT SPECIFIED  Final   Special Requests   Final    BOTTLES DRAWN AEROBIC ONLY Blood Culture adequate volume   Culture  Setup Time   Final    GRAM NEGATIVE RODS AEROBIC BOTTLE ONLY Organism ID to follow CRITICAL RESULT CALLED TO, READ BACK BY AND VERIFIED WITH: A. Rogers Blocker PharmD 10:10 08/06/20 ([email or web address]) Performed at [facility], [place] [address] [place], [place] [address]    Culture ESCHERICHIA COLI (A)  Final   Report Status 08/08/2020 FINAL  Final   Organism ID, Bacteria ESCHERICHIA COLI  Final      Susceptibility   Escherichia coli - MIC*    AMPICILLIN 4 SENSITIVE Sensitive     CEFAZOLIN <=4 SENSITIVE Sensitive     CEFEPIME <=0.12 SENSITIVE Sensitive     CEFTAZIDIME <=1 SENSITIVE Sensitive     CEFTRIAXONE <=0.25 SENSITIVE Sensitive     CIPROFLOXACIN >=4 RESISTANT Resistant     GENTAMICIN <=1 SENSITIVE Sensitive     IMIPENEM <=0.25 SENSITIVE Sensitive     TRIMETH/SULFA <=20 SENSITIVE Sensitive     AMPICILLIN/SULBACTAM <=2 SENSITIVE Sensitive     PIP/TAZO <=4 SENSITIVE Sensitive     * ESCHERICHIA COLI  Blood Culture ID Panel (Reflexed)     Status: Abnormal   Collection Time: 08/05/20  9:31 PM  Result Value Ref Range Status    Enterococcus faecalis NOT DETECTED NOT DETECTED Final   Enterococcus Faecium NOT DETECTED NOT DETECTED Final   Listeria monocytogenes NOT DETECTED NOT DETECTED Final   Staphylococcus species NOT DETECTED NOT DETECTED Final   Staphylococcus aureus (BCID) NOT DETECTED NOT DETECTED Final   Staphylococcus epidermidis NOT DETECTED NOT DETECTED Final   Staphylococcus lugdunensis NOT DETECTED NOT DETECTED Final   Streptococcus species NOT DETECTED NOT DETECTED Final   Streptococcus agalactiae NOT DETECTED NOT DETECTED Final   Streptococcus pneumoniae NOT DETECTED NOT DETECTED Final   Streptococcus pyogenes NOT DETECTED NOT DETECTED Final   A.calcoaceticus-baumannii NOT DETECTED NOT DETECTED Final   Bacteroides fragilis NOT DETECTED NOT DETECTED Final   Enterobacterales DETECTED (A) NOT DETECTED Final    Comment: Enterobacterales represent a large order of gram negative bacteria, not a single organism. CRITICAL RESULT CALLED TO, READ BACK BY AND VERIFIED WITH: A. Rogers Blocker PharmD 10:10 08/06/20 ([email or web address])    Enterobacter cloacae complex NOT DETECTED NOT DETECTED Final   Escherichia coli DETECTED (A) NOT DETECTED Final    Comment: CRITICAL RESULT CALLED TO, READ BACK BY AND VERIFIED WITH: A. Rogers Blocker PharmD 10:10 08/06/20 ([email or web address])    Klebsiella aerogenes NOT DETECTED NOT DETECTED Final   Klebsiella oxytoca NOT DETECTED NOT DETECTED Final   Klebsiella pneumoniae NOT DETECTED NOT DETECTED Final   Proteus species NOT DETECTED NOT DETECTED Final   Salmonella species NOT DETECTED NOT DETECTED Final   Serratia marcescens NOT DETECTED NOT DETECTED Final   Haemophilus influenzae NOT DETECTED NOT DETECTED Final   Neisseria meningitidis NOT DETECTED NOT DETECTED Final   Pseudomonas aeruginosa NOT DETECTED NOT DETECTED Final   Stenotrophomonas maltophilia NOT DETECTED NOT DETECTED Final   Candida albicans NOT DETECTED NOT DETECTED Final   Candida auris NOT DETECTED NOT DETECTED Final   Candida glabrata NOT  DETECTED NOT DETECTED Final   Candida krusei NOT DETECTED NOT DETECTED Final   Candida parapsilosis NOT DETECTED NOT DETECTED Final   Candida tropicalis NOT DETECTED NOT  DETECTED Final   Cryptococcus neoformans/gattii NOT DETECTED NOT DETECTED Final   CTX-M ESBL NOT DETECTED NOT DETECTED Final   Carbapenem resistance IMP NOT DETECTED NOT DETECTED Final   Carbapenem resistance KPC NOT DETECTED NOT DETECTED Final   Carbapenem resistance NDM NOT DETECTED NOT DETECTED Final   Carbapenem resist OXA 48 LIKE NOT DETECTED NOT DETECTED Final   Carbapenem resistance VIM NOT DETECTED NOT DETECTED Final    Comment: Performed at [facility], [place] [address] [place], [place] [address]  Culture, Urine     Status: Abnormal   Collection Time: 08/06/20  1:14 PM   Specimen: Urine, Random  Result Value Ref Range Status   Specimen Description URINE, RANDOM  Final   Special Requests   Final    NONE Performed at [facility], [place] [address], [place], [place] [address]    Culture MULTIPLE SPECIES PRESENT, SUGGEST RECOLLECTION (A)  Final   Report Status 08/07/2020 FINAL  Final         Radiology Studies: No results found.      Scheduled Meds: . carbidopa-levodopa  3 tablet Oral BID  . feeding supplement  237 mL Oral TID BM  . midodrine  2.5 mg Oral TID WC  . pantoprazole sodium  40 mg Oral Daily  . polyethylene glycol  17 g Oral Daily  . potassium chloride  40 mEq Oral Once  . primidone  150 mg Oral BID   Continuous Infusions:    LOS: 17 days    Time spent: 35 minutes    Edwin Dada, MD [facility] 08/13/2020, 3:02 PM     Please page though [place] or Epic secure chat:  For [employer], Adult nurse

## 2020-08-14 DIAGNOSIS — I639 Cerebral infarction, unspecified: Secondary | ICD-10-CM | POA: Diagnosis not present

## 2020-08-14 NOTE — Progress Notes (Signed)
[facility] PROGRESS NOTE    Jeffrey Blair  [id number] DOB: [birth year] DOA: 07/25/2020 PCP: [facility]      Brief Narrative:  [name] is a 86 y.o. M with dementia, ambulatory and home dwelling, Parkinson's disease, prostate CA in remission, COPD and TIA brought due to confusion, speech disturbance.  Initially woke with new slurred speech, seen in the ER at [facility], waiting bed for admission for stroke when he became agitated and so family brought him home.  At home, he was altered and could not follow commands, so he was brought back to [facility], and transferred to [facility] due to stroke.   Assessment & Plan:  Acute right MCA CVA Sepsis due to E coli bacteremia Acute metabolic encephalopathy Transaminitis Parkinson's disease Depression COPD Hypokalemia Hyponatremia AKI Thrombocytopenia   Admitted for stroke, MRI showed acute large infarct involving approximately 7 cm are of right MCA/PCA territory.  12/27-1/4 patient initially failed SLP evaluation and oral intake persistently very poor and not enough to keep up with hydration and nutrition needs, placement delayed.  On 1/5, there was new hypotension, worsening hemiparesis, CT showed interval expansion of stroke.  This resolved with fluids and midodrine. However, the patient had progressive weakness, poor oral intake. On 1/12, there was a palliative meeting, family elected for comfort care and residential hospice.    - Full comfort measures  - Continue Sinemet, primidone, midodrine as these may provide some symptomatic relief  - Continue Ativan, hydromorphone as needed for pain or anxiety      Disposition: Status is: Inpatient  Remains inpatient appropriate because:IV treatments appropriate due to intensity of illness or inability to take PO   Dispo: The patient is from: Home              Anticipated d/c is to: inpatient Hospice              Patient currently is medically  stable to d/c.     MDM: The below labs and imaging reports were reviewed and summarized above.  Medication management as above.    DVT prophylaxis:   Code Status: DNR Family Communication:            Subjective: C/o chest discomfort  Objective: Vitals:   08/13/20 2012 08/13/20 2341 08/14/20 0418 08/14/20 0727  BP: (!) 143/74 136/64 140/76 (!) 141/64  Pulse: (!) 105 (!) 106 (!) 109 (!) 107  Resp: 20 18 19 15   Temp: 99.1 F (37.3 C) 98.9 F (37.2 C) (!) 100.4 F (38 C) 99.7 F (37.6 C)  TempSrc: Oral Oral Axillary Oral  SpO2: 97% 95% 97% 95%  Weight:      Height:        Intake/Output Summary (Last 24 hours) at 08/14/2020 1045 Last data filed at 08/14/2020 0441 Gross per 24 hour  Intake -  Output 825 ml  Net -825 ml   Filed Weights   07/25/20 1925  Weight: 72.6 kg    Examination: Appears comfortable in bed Confused Poor dentition        Data Reviewed: I have personally reviewed following labs and imaging studies:  CBC: Recent Labs  Lab 08/08/20 0425 08/11/20 0450 08/12/20 1101  WBC 15.4* 20.5* 16.4*  HGB 11.0* 11.3* 11.2*  HCT 32.4* 32.5* 31.9*  MCV 86.6 83.1 80.8  PLT  --  53* 62*   Basic Metabolic Panel: Recent Labs  Lab 08/08/20 0425 08/09/20 0228 08/11/20 0450 08/12/20 1101  NA 137 133* 133* 132*  K 3.0* 4.3 3.4* 3.0*  CL 105 104 104 99  CO2 20* 16* 20* 24  GLUCOSE 68* 74 112* 93  BUN 42* 32* 14 6*  CREATININE 1.59* 1.07 0.67 0.65  CALCIUM 7.3* 7.2* 7.5* 7.6*   GFR: Estimated Creatinine Clearance: 63 mL/min (by C-G formula based on SCr of 0.65 mg/dL). Liver Function Tests: Recent Labs  Lab 08/10/20 1455  AST 175*  ALT 68*  ALKPHOS 461*  BILITOT 5.3*  PROT 5.6*  ALBUMIN 2.1*   No results for input(s): LIPASE, AMYLASE in the last 168 hours. No results for input(s): AMMONIA in the last 168 hours. Coagulation Profile: No results for input(s): INR, PROTIME in the last 168 hours. Cardiac Enzymes: No results for  input(s): CKTOTAL, CKMB, CKMBINDEX, TROPONINI in the last 168 hours. BNP (last 3 results) No results for input(s): PROBNP in the last 8760 hours. HbA1C: No results for input(s): HGBA1C in the last 72 hours. CBG: No results for input(s): GLUCAP in the last 168 hours. Lipid Profile: No results for input(s): CHOL, HDL, LDLCALC, TRIG, CHOLHDL, LDLDIRECT in the last 72 hours. Thyroid Function Tests: No results for input(s): TSH, T4TOTAL, FREET4, T3FREE, THYROIDAB in the last 72 hours. Anemia Panel: No results for input(s): VITAMINB12, FOLATE, FERRITIN, TIBC, IRON, RETICCTPCT in the last 72 hours. Urine analysis:    Component Value Date/Time   COLORURINE AMBER (A) 08/06/2020 1041   APPEARANCEUR CLOUDY (A) 08/06/2020 1041   LABSPEC 1.020 08/06/2020 1041   PHURINE 5.0 08/06/2020 1041   GLUCOSEU NEGATIVE 08/06/2020 1041   GLUCOSEU NEGATIVE 05/21/2015 1632   HGBUR SMALL (A) 08/06/2020 1041   BILIRUBINUR SMALL (A) 08/06/2020 1041   KETONESUR 5 (A) 08/06/2020 1041   PROTEINUR 100 (A) 08/06/2020 1041   UROBILINOGEN 0.2 05/21/2015 1632   NITRITE NEGATIVE 08/06/2020 1041   LEUKOCYTESUR NEGATIVE 08/06/2020 1041    Recent Results (from the past 240 hour(s))  Resp Panel by RT-PCR (Flu A&B, Covid) Nasopharyngeal Swab     Status: None   Collection Time: 08/04/20  2:07 PM   Specimen: Nasopharyngeal Swab; Nasopharyngeal(NP) swabs in vial transport medium  Result Value Ref Range Status   SARS Coronavirus 2 by RT PCR NEGATIVE NEGATIVE Final    Comment: (NOTE) SARS-CoV-2 target nucleic acids are NOT DETECTED.  The SARS-CoV-2 RNA is generally detectable in upper respiratory specimens during the acute phase of infection. The lowest concentration of SARS-CoV-2 viral copies this assay can detect is 138 copies/mL. A negative result does not preclude SARS-Cov-2 infection and should not be used as the sole basis for treatment or other patient management decisions. A negative result may occur with   improper specimen collection/handling, submission of specimen other than nasopharyngeal swab, presence of viral mutation(s) within the areas targeted by this assay, and inadequate number of viral copies(<138 copies/mL). A negative result must be combined with clinical observations, patient history, and epidemiological information. The expected result is Negative.  Fact Sheet for Patients:  EntrepreneurPulse.com.au  Fact Sheet for Healthcare Providers:  IncredibleEmployment.be  This test is no t yet approved or cleared by the [place] FDA and  has been authorized for detection and/or diagnosis of SARS-CoV-2 by FDA under an Emergency Use Authorization (EUA). This EUA will remain  in effect (meaning this test can be used) for the duration of the COVID-19 declaration under Section 564(b)(1) of the Act, 21 U.S.C.section 360bbb-3(b)(1), unless the authorization is terminated  or revoked sooner.       Influenza A by PCR NEGATIVE NEGATIVE Final  Influenza B by PCR NEGATIVE NEGATIVE Final    Comment: (NOTE) The Xpert Xpress SARS-CoV-2/FLU/RSV plus assay is intended as an aid in the diagnosis of influenza from Nasopharyngeal swab specimens and should not be used as a sole basis for treatment. Nasal washings and aspirates are unacceptable for Xpert Xpress SARS-CoV-2/FLU/RSV testing.  Fact Sheet for Patients: EntrepreneurPulse.com.au  Fact Sheet for Healthcare Providers: IncredibleEmployment.be  This test is not yet approved or cleared by the [place] FDA and has been authorized for detection and/or diagnosis of SARS-CoV-2 by FDA under an Emergency Use Authorization (EUA). This EUA will remain in effect (meaning this test can be used) for the duration of the COVID-19 declaration under Section 564(b)(1) of the Act, 21 U.S.C. section 360bbb-3(b)(1), unless the authorization is terminated  or revoked.  Performed at [facility], [place] [address] [place], [place] [address]   Culture, blood (routine x 2)     Status: Abnormal   Collection Time: 08/05/20  9:31 PM   Specimen: BLOOD  Result Value Ref Range Status   Specimen Description BLOOD SITE NOT SPECIFIED  Final   Special Requests   Final    BOTTLES DRAWN AEROBIC ONLY Blood Culture results may not be optimal due to an inadequate volume of blood received in culture bottles   Culture  Setup Time   Final    GRAM NEGATIVE RODS AEROBIC BOTTLE ONLY CRITICAL VALUE NOTED.  VALUE IS CONSISTENT WITH PREVIOUSLY REPORTED AND CALLED VALUE.    Culture (A)  Final    ESCHERICHIA COLI SUSCEPTIBILITIES PERFORMED ON PREVIOUS CULTURE WITHIN THE LAST 5 DAYS. Performed at [facility], [place] [address], [place], [place] [address]    Report Status 08/08/2020 FINAL  Final  Culture, blood (routine x 2)     Status: Abnormal   Collection Time: 08/05/20  9:31 PM   Specimen: BLOOD  Result Value Ref Range Status   Specimen Description BLOOD SITE NOT SPECIFIED  Final   Special Requests   Final    BOTTLES DRAWN AEROBIC ONLY Blood Culture adequate volume   Culture  Setup Time   Final    GRAM NEGATIVE RODS AEROBIC BOTTLE ONLY Organism ID to follow CRITICAL RESULT CALLED TO, READ BACK BY AND VERIFIED WITH: A. Rogers Blocker PharmD 10:10 08/06/20 ([email or web address]) Performed at [facility], [place] [address], [place], [place] [address]    Culture ESCHERICHIA COLI (A)  Final   Report Status 08/08/2020 FINAL  Final   Organism ID, Bacteria ESCHERICHIA COLI  Final      Susceptibility   Escherichia coli - MIC*    AMPICILLIN 4 SENSITIVE Sensitive     CEFAZOLIN <=4 SENSITIVE Sensitive     CEFEPIME <=0.12 SENSITIVE Sensitive     CEFTAZIDIME <=1 SENSITIVE Sensitive     CEFTRIAXONE <=0.25 SENSITIVE Sensitive     CIPROFLOXACIN >=4 RESISTANT Resistant     GENTAMICIN <=1 SENSITIVE Sensitive     IMIPENEM <=0.25 SENSITIVE Sensitive     TRIMETH/SULFA <=20  SENSITIVE Sensitive     AMPICILLIN/SULBACTAM <=2 SENSITIVE Sensitive     PIP/TAZO <=4 SENSITIVE Sensitive     * ESCHERICHIA COLI  Blood Culture ID Panel (Reflexed)     Status: Abnormal   Collection Time: 08/05/20  9:31 PM  Result Value Ref Range Status   Enterococcus faecalis NOT DETECTED NOT DETECTED Final   Enterococcus Faecium NOT DETECTED NOT DETECTED Final   Listeria monocytogenes NOT DETECTED NOT DETECTED Final   Staphylococcus species NOT DETECTED NOT  DETECTED Final   Staphylococcus aureus (BCID) NOT DETECTED NOT DETECTED Final   Staphylococcus epidermidis NOT DETECTED NOT DETECTED Final   Staphylococcus lugdunensis NOT DETECTED NOT DETECTED Final   Streptococcus species NOT DETECTED NOT DETECTED Final   Streptococcus agalactiae NOT DETECTED NOT DETECTED Final   Streptococcus pneumoniae NOT DETECTED NOT DETECTED Final   Streptococcus pyogenes NOT DETECTED NOT DETECTED Final   A.calcoaceticus-baumannii NOT DETECTED NOT DETECTED Final   Bacteroides fragilis NOT DETECTED NOT DETECTED Final   Enterobacterales DETECTED (A) NOT DETECTED Final    Comment: Enterobacterales represent a large order of gram negative bacteria, not a single organism. CRITICAL RESULT CALLED TO, READ BACK BY AND VERIFIED WITH: A. Rogers Blocker PharmD 10:10 08/06/20 ([email or web address])    Enterobacter cloacae complex NOT DETECTED NOT DETECTED Final   Escherichia coli DETECTED (A) NOT DETECTED Final    Comment: CRITICAL RESULT CALLED TO, READ BACK BY AND VERIFIED WITH: A. Rogers Blocker PharmD 10:10 08/06/20 ([email or web address])    Klebsiella aerogenes NOT DETECTED NOT DETECTED Final   Klebsiella oxytoca NOT DETECTED NOT DETECTED Final   Klebsiella pneumoniae NOT DETECTED NOT DETECTED Final   Proteus species NOT DETECTED NOT DETECTED Final   Salmonella species NOT DETECTED NOT DETECTED Final   Serratia marcescens NOT DETECTED NOT DETECTED Final   Haemophilus influenzae NOT DETECTED NOT DETECTED Final   Neisseria meningitidis NOT DETECTED NOT  DETECTED Final   Pseudomonas aeruginosa NOT DETECTED NOT DETECTED Final   Stenotrophomonas maltophilia NOT DETECTED NOT DETECTED Final   Candida albicans NOT DETECTED NOT DETECTED Final   Candida auris NOT DETECTED NOT DETECTED Final   Candida glabrata NOT DETECTED NOT DETECTED Final   Candida krusei NOT DETECTED NOT DETECTED Final   Candida parapsilosis NOT DETECTED NOT DETECTED Final   Candida tropicalis NOT DETECTED NOT DETECTED Final   Cryptococcus neoformans/gattii NOT DETECTED NOT DETECTED Final   CTX-M ESBL NOT DETECTED NOT DETECTED Final   Carbapenem resistance IMP NOT DETECTED NOT DETECTED Final   Carbapenem resistance KPC NOT DETECTED NOT DETECTED Final   Carbapenem resistance NDM NOT DETECTED NOT DETECTED Final   Carbapenem resist OXA 48 LIKE NOT DETECTED NOT DETECTED Final   Carbapenem resistance VIM NOT DETECTED NOT DETECTED Final    Comment: Performed at [facility], [address], [place], [place] [address]  Culture, Urine     Status: Abnormal   Collection Time: 08/06/20  1:14 PM   Specimen: Urine, Random  Result Value Ref Range Status   Specimen Description URINE, RANDOM  Final   Special Requests   Final    NONE Performed at [facility], [place] [address] [place], [place] [address]    Culture MULTIPLE SPECIES PRESENT, SUGGEST RECOLLECTION (A)  Final   Report Status 08/07/2020 FINAL  Final         Radiology Studies: No results found.      Scheduled Meds: . carbidopa-levodopa  3 tablet Oral BID  . feeding supplement  237 mL Oral TID BM  . midodrine  2.5 mg Oral TID WC  . pantoprazole sodium  40 mg Oral Daily  . polyethylene glycol  17 g Oral Daily  . potassium chloride  40 mEq Oral Once  . primidone  150 mg Oral BID   Continuous Infusions:    LOS: 18 days    Time spent: 35 minutes    Geradine Girt, DO [facility] 08/14/2020, 10:45 AM     Please page though [place] or Epic secure chat:  For [employer], Teacher, music

## 2020-08-14 NOTE — TOC Progression Note (Signed)
Transition of Care [facility]) - Progression Note    Patient Details  Name: HART HAAS MRN: [id number] Date of Birth: 01-15-1928  Transition of Care [facility]) CM/SW [place], [place] Phone Number: 08/14/2020, 10:25 AM  Clinical Narrative:   CSW reached out to [place], spoke with Admissions who indicated that there was still no bed today. Admissions has left a voicemail for the son to update him. CSW provided Admissions with the weekend on-call CSW number, in case a bed becomes open over the weekend. CSW to follow.    Expected Discharge Plan: [place] Barriers to Discharge: Continued Medical Work up  Expected Discharge Plan and Services Expected Discharge Plan: [place] In-house Referral: Clinical Social Work   Post Acute Care Choice: [place] Living arrangements for the past 2 months: Single Family Home                                       Social Determinants of Health (SDOH) Interventions    Readmission Risk Interventions No flowsheet data found.

## 2020-08-15 DIAGNOSIS — I639 Cerebral infarction, unspecified: Secondary | ICD-10-CM | POA: Diagnosis not present

## 2020-08-15 NOTE — Plan of Care (Signed)
  Problem: Education: Goal: Knowledge of disease or condition will improve Outcome: Progressing Goal: Knowledge of secondary prevention will improve Outcome: Progressing Goal: Knowledge of patient specific risk factors addressed and post discharge goals established will improve Outcome: Progressing Goal: Individualized Educational Video(s) Outcome: Progressing   Problem: Coping: Goal: Will verbalize positive feelings about self Outcome: Progressing   Problem: Health Behavior/Discharge Planning: Goal: Ability to manage health-related needs will improve Outcome: Progressing   Problem: Self-Care: Goal: Ability to participate in self-care as condition permits will improve Outcome: Progressing Goal: Verbalization of feelings and concerns over difficulty with self-care will improve Outcome: Progressing Goal: Ability to communicate needs accurately will improve Outcome: Progressing   Problem: Nutrition: Goal: Risk of aspiration will decrease Outcome: Progressing Goal: Dietary intake will improve Outcome: Progressing   Problem: Ischemic Stroke/TIA Tissue Perfusion: Goal: Complications of ischemic stroke/TIA will be minimized Outcome: Progressing   Problem: Education: Goal: Knowledge of disease or condition will improve Outcome: Progressing   Problem: Intracerebral Hemorrhage Tissue Perfusion: Goal: Complications of Intracerebral Hemorrhage will be minimized Outcome: Progressing   Problem: Ischemic Stroke/TIA Tissue Perfusion: Goal: Complications of ischemic stroke/TIA will be minimized Outcome: Progressing   Problem: Spontaneous Subarachnoid Hemorrhage Tissue Perfusion: Goal: Complications of Spontaneous Subarachnoid Hemorrhage will be minimized Outcome: Progressing

## 2020-08-15 NOTE — Progress Notes (Signed)
[facility] PROGRESS NOTE    Jeffrey Blair  [id number] DOB: 1927/11/23 DOA: 07/25/2020 PCP: [facility]      Brief Narrative:  Jeffrey Blair is a 87 y.o. M with dementia, ambulatory and home dwelling, Parkinson's disease, prostate CA in remission, COPD and TIA brought due to confusion, speech disturbance.  Initially woke with new slurred speech, seen in the ER at [facility], waiting bed for admission for stroke when he became agitated and so family brought him home.  At home, he was altered and could not follow commands, so he was brought back to [facility], and transferred to [facility] due to stroke.   Assessment & Plan:  Acute right MCA CVA Sepsis due to E coli bacteremia Acute metabolic encephalopathy Transaminitis Parkinson's disease Depression COPD Hypokalemia Hyponatremia AKI Thrombocytopenia   Admitted for stroke, MRI showed acute large infarct involving approximately 7 cm are of right MCA/PCA territory.  12/27-1/4 patient initially failed SLP evaluation and oral intake persistently very poor and not enough to keep up with hydration and nutrition needs, placement delayed.  On 1/5, there was new hypotension, worsening hemiparesis, CT showed interval expansion of stroke.  This resolved with fluids and midodrine. However, the patient had progressive weakness, poor oral intake. On 1/12, there was a palliative meeting, family elected for comfort care and residential hospice.    - Full comfort measures  - Continue Sinemet, primidone, midodrine as these may provide some symptomatic relief  - Continue Ativan, hydromorphone as needed for pain or anxiety      Disposition: Status is: Inpatient  Remains inpatient appropriate because:IV treatments appropriate due to intensity of illness or inability to take PO   Dispo: The patient is from: Home              Anticipated d/c is to: inpatient Hospice              Patient currently is medically  stable to d/c.     MDM: The below labs and imaging reports were reviewed and summarized above.  Medication management as above.    DVT prophylaxis:   Code Status: DNR Family Communication:            Subjective: No current complaints  Objective: Vitals:   08/14/20 1549 08/14/20 2006 08/15/20 0438 08/15/20 0853  BP: (!) 138/59 (!) 143/80 (!) 144/65 (!) 145/67  Pulse: (!) 108 (!) 107 (!) 108 (!) 108  Resp: 16 20  18   Temp: 98.7 F (37.1 C) 99.6 F (37.6 C) 98.2 F (36.8 C) (!) 100.9 F (38.3 C)  TempSrc: Axillary Axillary Oral Axillary  SpO2: 98% 93% 93% 94%  Weight:      Height:        Intake/Output Summary (Last 24 hours) at 08/15/2020 1111 Last data filed at 08/15/2020 0404 Gross per 24 hour  Intake -  Output 1500 ml  Net -1500 ml   Filed Weights   07/25/20 1925  Weight: 72.6 kg    Examination: In bed, confused but comfortable       Data Reviewed: I have personally reviewed following labs and imaging studies:  CBC: Recent Labs  Lab 08/11/20 0450 08/12/20 1101  WBC 20.5* 16.4*  HGB 11.3* 11.2*  HCT 32.5* 31.9*  MCV 83.1 80.8  PLT 53* 62*   Basic Metabolic Panel: Recent Labs  Lab 08/09/20 0228 08/11/20 0450 08/12/20 1101  NA 133* 133* 132*  K 4.3 3.4* 3.0*  CL 104 104 99  CO2 16*  20* 24  GLUCOSE 74 112* 93  BUN 32* 14 6*  CREATININE 1.07 0.67 0.65  CALCIUM 7.2* 7.5* 7.6*   GFR: Estimated Creatinine Clearance: 63 mL/min (by C-G formula based on SCr of 0.65 mg/dL). Liver Function Tests: Recent Labs  Lab 08/10/20 1455  AST 175*  ALT 68*  ALKPHOS 461*  BILITOT 5.3*  PROT 5.6*  ALBUMIN 2.1*   No results for input(s): LIPASE, AMYLASE in the last 168 hours. No results for input(s): AMMONIA in the last 168 hours. Coagulation Profile: No results for input(s): INR, PROTIME in the last 168 hours. Cardiac Enzymes: No results for input(s): CKTOTAL, CKMB, CKMBINDEX, TROPONINI in the last 168 hours. BNP (last 3 results) No  results for input(s): PROBNP in the last 8760 hours. HbA1C: No results for input(s): HGBA1C in the last 72 hours. CBG: No results for input(s): GLUCAP in the last 168 hours. Lipid Profile: No results for input(s): CHOL, HDL, LDLCALC, TRIG, CHOLHDL, LDLDIRECT in the last 72 hours. Thyroid Function Tests: No results for input(s): TSH, T4TOTAL, FREET4, T3FREE, THYROIDAB in the last 72 hours. Anemia Panel: No results for input(s): VITAMINB12, FOLATE, FERRITIN, TIBC, IRON, RETICCTPCT in the last 72 hours. Urine analysis:    Component Value Date/Time   COLORURINE AMBER (A) 08/06/2020 1041   APPEARANCEUR CLOUDY (A) 08/06/2020 1041   LABSPEC 1.020 08/06/2020 1041   PHURINE 5.0 08/06/2020 1041   GLUCOSEU NEGATIVE 08/06/2020 1041   GLUCOSEU NEGATIVE 05/21/2015 1632   HGBUR SMALL (A) 08/06/2020 1041   BILIRUBINUR SMALL (A) 08/06/2020 1041   KETONESUR 5 (A) 08/06/2020 1041   PROTEINUR 100 (A) 08/06/2020 1041   UROBILINOGEN 0.2 05/21/2015 1632   NITRITE NEGATIVE 08/06/2020 1041   LEUKOCYTESUR NEGATIVE 08/06/2020 1041    Recent Results (from the past 240 hour(s))  Culture, blood (routine x 2)     Status: Abnormal   Collection Time: 08/05/20  9:31 PM   Specimen: BLOOD  Result Value Ref Range Status   Specimen Description BLOOD SITE NOT SPECIFIED  Final   Special Requests   Final    BOTTLES DRAWN AEROBIC ONLY Blood Culture results may not be optimal due to an inadequate volume of blood received in culture bottles   Culture  Setup Time   Final    GRAM NEGATIVE RODS AEROBIC BOTTLE ONLY CRITICAL VALUE NOTED.  VALUE IS CONSISTENT WITH PREVIOUSLY REPORTED AND CALLED VALUE.    Culture (A)  Final    ESCHERICHIA COLI SUSCEPTIBILITIES PERFORMED ON PREVIOUS CULTURE WITHIN THE LAST 5 DAYS. Performed at [facility], [place] [address], [place], [place] [address]    Report Status 08/08/2020 FINAL  Final  Culture, blood (routine x 2)     Status: Abnormal   Collection Time: 08/05/20  9:31 PM    Specimen: BLOOD  Result Value Ref Range Status   Specimen Description BLOOD SITE NOT SPECIFIED  Final   Special Requests   Final    BOTTLES DRAWN AEROBIC ONLY Blood Culture adequate volume   Culture  Setup Time   Final    GRAM NEGATIVE RODS AEROBIC BOTTLE ONLY Organism ID to follow CRITICAL RESULT CALLED TO, READ BACK BY AND VERIFIED WITH: A. Rogers Blocker PharmD 10:10 08/06/20 ([email or web address]) Performed at [facility], [place] [address] [place], [place] [address]    Culture ESCHERICHIA COLI (A)  Final   Report Status 08/08/2020 FINAL  Final   Organism ID, Bacteria ESCHERICHIA COLI  Final      Susceptibility   Escherichia coli -  MIC*    AMPICILLIN 4 SENSITIVE Sensitive     CEFAZOLIN <=4 SENSITIVE Sensitive     CEFEPIME <=0.12 SENSITIVE Sensitive     CEFTAZIDIME <=1 SENSITIVE Sensitive     CEFTRIAXONE <=0.25 SENSITIVE Sensitive     CIPROFLOXACIN >=4 RESISTANT Resistant     GENTAMICIN <=1 SENSITIVE Sensitive     IMIPENEM <=0.25 SENSITIVE Sensitive     TRIMETH/SULFA <=20 SENSITIVE Sensitive     AMPICILLIN/SULBACTAM <=2 SENSITIVE Sensitive     PIP/TAZO <=4 SENSITIVE Sensitive     * ESCHERICHIA COLI  Blood Culture ID Panel (Reflexed)     Status: Abnormal   Collection Time: 08/05/20  9:31 PM  Result Value Ref Range Status   Enterococcus faecalis NOT DETECTED NOT DETECTED Final   Enterococcus Faecium NOT DETECTED NOT DETECTED Final   Listeria monocytogenes NOT DETECTED NOT DETECTED Final   Staphylococcus species NOT DETECTED NOT DETECTED Final   Staphylococcus aureus (BCID) NOT DETECTED NOT DETECTED Final   Staphylococcus epidermidis NOT DETECTED NOT DETECTED Final   Staphylococcus lugdunensis NOT DETECTED NOT DETECTED Final   Streptococcus species NOT DETECTED NOT DETECTED Final   Streptococcus agalactiae NOT DETECTED NOT DETECTED Final   Streptococcus pneumoniae NOT DETECTED NOT DETECTED Final   Streptococcus pyogenes NOT DETECTED NOT DETECTED Final   A.calcoaceticus-baumannii NOT DETECTED  NOT DETECTED Final   Bacteroides fragilis NOT DETECTED NOT DETECTED Final   Enterobacterales DETECTED (A) NOT DETECTED Final    Comment: Enterobacterales represent a large order of gram negative bacteria, not a single organism. CRITICAL RESULT CALLED TO, READ BACK BY AND VERIFIED WITH: A. Rogers Blocker PharmD 10:10 08/06/20 ([email or web address])    Enterobacter cloacae complex NOT DETECTED NOT DETECTED Final   Escherichia coli DETECTED (A) NOT DETECTED Final    Comment: CRITICAL RESULT CALLED TO, READ BACK BY AND VERIFIED WITH: A. Rogers Blocker PharmD 10:10 08/06/20 ([email or web address])    Klebsiella aerogenes NOT DETECTED NOT DETECTED Final   Klebsiella oxytoca NOT DETECTED NOT DETECTED Final   Klebsiella pneumoniae NOT DETECTED NOT DETECTED Final   Proteus species NOT DETECTED NOT DETECTED Final   Salmonella species NOT DETECTED NOT DETECTED Final   Serratia marcescens NOT DETECTED NOT DETECTED Final   Haemophilus influenzae NOT DETECTED NOT DETECTED Final   Neisseria meningitidis NOT DETECTED NOT DETECTED Final   Pseudomonas aeruginosa NOT DETECTED NOT DETECTED Final   Stenotrophomonas maltophilia NOT DETECTED NOT DETECTED Final   Candida albicans NOT DETECTED NOT DETECTED Final   Candida auris NOT DETECTED NOT DETECTED Final   Candida glabrata NOT DETECTED NOT DETECTED Final   Candida krusei NOT DETECTED NOT DETECTED Final   Candida parapsilosis NOT DETECTED NOT DETECTED Final   Candida tropicalis NOT DETECTED NOT DETECTED Final   Cryptococcus neoformans/gattii NOT DETECTED NOT DETECTED Final   CTX-M ESBL NOT DETECTED NOT DETECTED Final   Carbapenem resistance IMP NOT DETECTED NOT DETECTED Final   Carbapenem resistance KPC NOT DETECTED NOT DETECTED Final   Carbapenem resistance NDM NOT DETECTED NOT DETECTED Final   Carbapenem resist OXA 48 LIKE NOT DETECTED NOT DETECTED Final   Carbapenem resistance VIM NOT DETECTED NOT DETECTED Final    Comment: Performed at [facility], [address], [place], [place]  [address]  Culture, Urine     Status: Abnormal   Collection Time: 08/06/20  1:14 PM   Specimen: Urine, Random  Result Value Ref Range Status   Specimen Description URINE, RANDOM  Final   Special Requests   Final    NONE  Performed at [facility], [place] [address], [place], [place] [address]    Culture MULTIPLE SPECIES PRESENT, SUGGEST RECOLLECTION (A)  Final   Report Status 08/07/2020 FINAL  Final         Radiology Studies: No results found.      Scheduled Meds: . carbidopa-levodopa  3 tablet Oral BID  . feeding supplement  237 mL Oral TID BM  . midodrine  2.5 mg Oral TID WC  . pantoprazole sodium  40 mg Oral Daily  . polyethylene glycol  17 g Oral Daily  . potassium chloride  40 mEq Oral Once  . primidone  150 mg Oral BID   Continuous Infusions:    LOS: 19 days    Time spent: 35 minutes    Geradine Girt, DO [facility] 08/15/2020, 11:11 AM     Please page though [place] or Epic secure chat:  For [employer], Adult nurse

## 2020-08-15 NOTE — Progress Notes (Signed)
Patient ID: Jeffrey Blair, male   DOB: 1927-09-19, 87 y.o.   MRN: [id number]  Medical records reviewed, assessed the patient.   This NP visited patient at the bedside as a follow up for palliative medicine  needs and emotional support.  Focus of care is  comfort and dignity as documented on 08-12-20  Patient appears comfortable, he is lethargic.   He is taking sips of fluids.  Unable to follow commands.  Awaits hospice bed in [facility] with nursing regarding utilization of as needed medications for symptom management.      Discussed with bedside RN  Total time spent on the unit was 15 minutes  Greater than 50% of the time was spent in counseling and coordination of care  Wadie Lessen NP  Palliative Medicine Team Team Phone # [phone] Pager [phone]

## 2020-08-16 DIAGNOSIS — I639 Cerebral infarction, unspecified: Secondary | ICD-10-CM | POA: Diagnosis not present

## 2020-08-16 NOTE — Plan of Care (Signed)
Problem: Education: Goal: Knowledge of disease or condition will improve 08/16/2020 0249 by Lennox Grumbles, RN Outcome: Progressing 08/16/2020 0249 by Lennox Grumbles, RN Outcome: Progressing Goal: Knowledge of secondary prevention will improve 08/16/2020 0249 by Lennox Grumbles, RN Outcome: Progressing 08/16/2020 0249 by Lennox Grumbles, RN Outcome: Progressing Goal: Knowledge of patient specific risk factors addressed and post discharge goals established will improve 08/16/2020 0249 by Lennox Grumbles, RN Outcome: Progressing 08/16/2020 0249 by Lennox Grumbles, RN Outcome: Progressing Goal: Individualized Educational Video(s) 08/16/2020 0249 by Lennox Grumbles, RN Outcome: Progressing 08/16/2020 0249 by Lennox Grumbles, RN Outcome: Progressing   Problem: Health Behavior/Discharge Planning: Goal: Ability to manage health-related needs will improve Outcome: Progressing   Problem: Self-Care: Goal: Ability to participate in self-care as condition permits will improve Outcome: Progressing Goal: Verbalization of feelings and concerns over difficulty with self-care will improve Outcome: Progressing Goal: Ability to communicate needs accurately will improve Outcome: Progressing   Problem: Nutrition: Goal: Risk of aspiration will decrease Outcome: Progressing Goal: Dietary intake will improve Outcome: Progressing   Problem: Ischemic Stroke/TIA Tissue Perfusion: Goal: Complications of ischemic stroke/TIA will be minimized 08/16/2020 0249 by Lennox Grumbles, RN Outcome: Progressing 08/16/2020 0249 by Lennox Grumbles, RN Outcome: Progressing   Problem: Education: Goal: Knowledge of disease or condition will improve Outcome: Progressing   Problem: Intracerebral Hemorrhage Tissue Perfusion: Goal: Complications of Intracerebral Hemorrhage will be minimized Outcome: Progressing   Problem: Ischemic Stroke/TIA Tissue Perfusion: Goal: Complications of ischemic stroke/TIA will be  minimized 08/16/2020 0249 by Lennox Grumbles, RN Outcome: Progressing 08/16/2020 0249 by Lennox Grumbles, RN Outcome: Progressing   Problem: Spontaneous Subarachnoid Hemorrhage Tissue Perfusion: Goal: Complications of Spontaneous Subarachnoid Hemorrhage will be minimized Outcome: Progressing   Problem: Education: Goal: Knowledge of General Education information will improve Description: Including pain rating scale, medication(s)/side effects and non-pharmacologic comfort measures 08/16/2020 0249 by Lennox Grumbles, RN Outcome: Progressing 08/16/2020 0249 by Lennox Grumbles, RN Outcome: Progressing   Problem: Health Behavior/Discharge Planning: Goal: Ability to manage health-related needs will improve 08/16/2020 0249 by Lennox Grumbles, RN Outcome: Progressing 08/16/2020 0249 by Lennox Grumbles, RN Outcome: Progressing   Problem: Clinical Measurements: Goal: Ability to maintain clinical measurements within normal limits will improve 08/16/2020 0249 by Lennox Grumbles, RN Outcome: Progressing 08/16/2020 0249 by Lennox Grumbles, RN Outcome: Progressing Goal: Will remain free from infection 08/16/2020 0249 by Lennox Grumbles, RN Outcome: Progressing 08/16/2020 0249 by Lennox Grumbles, RN Outcome: Progressing Goal: Diagnostic test results will improve 08/16/2020 0249 by Lennox Grumbles, RN Outcome: Progressing 08/16/2020 0249 by Lennox Grumbles, RN Outcome: Progressing Goal: Respiratory complications will improve 08/16/2020 0249 by Lennox Grumbles, RN Outcome: Progressing 08/16/2020 0249 by Lennox Grumbles, RN Outcome: Progressing Goal: Cardiovascular complication will be avoided 08/16/2020 0249 by Lennox Grumbles, RN Outcome: Progressing 08/16/2020 0249 by Lennox Grumbles, RN Outcome: Progressing   Problem: Activity: Goal: Risk for activity intolerance will decrease Outcome: Progressing   Problem: Nutrition: Goal: Adequate nutrition will be maintained Outcome: Progressing   Problem:  Coping: Goal: Level of anxiety will decrease Outcome: Progressing   Problem: Elimination: Goal: Will not experience complications related to bowel motility Outcome: Progressing Goal: Will not experience complications related to urinary retention Outcome: Progressing   Problem: Pain Managment: Goal: General experience of comfort will improve Outcome: Progressing   Problem: Safety: Goal: Ability to remain free from injury will improve Outcome: Progressing   Problem: Skin  Integrity: Goal: Risk for impaired skin integrity will decrease Outcome: Progressing   Problem: Education: Goal: Knowledge of the prescribed therapeutic regimen will improve 08/16/2020 0249 by Lennox Grumbles, RN Outcome: Progressing 08/16/2020 0249 by Lennox Grumbles, RN Outcome: Progressing   Problem: Coping: Goal: Ability to identify and develop effective coping behavior will improve 08/16/2020 0249 by Lennox Grumbles, RN Outcome: Progressing 08/16/2020 0249 by Lennox Grumbles, RN Outcome: Progressing   Problem: Clinical Measurements: Goal: Quality of life will improve 08/16/2020 0249 by Lennox Grumbles, RN Outcome: Progressing 08/16/2020 0249 by Lennox Grumbles, RN Outcome: Progressing   Problem: Respiratory: Goal: Verbalizations of increased ease of respirations will increase 08/16/2020 0249 by Lennox Grumbles, RN Outcome: Progressing 08/16/2020 0249 by Lennox Grumbles, RN Outcome: Progressing   Problem: Role Relationship: Goal: Family's ability to cope with current situation will improve 08/16/2020 0249 by Lennox Grumbles, RN Outcome: Progressing 08/16/2020 0249 by Lennox Grumbles, RN Outcome: Progressing Goal: Ability to verbalize concerns, feelings, and thoughts to partner or family member will improve 08/16/2020 0249 by Lennox Grumbles, RN Outcome: Progressing 08/16/2020 0249 by Lennox Grumbles, RN Outcome: Progressing

## 2020-08-16 NOTE — Progress Notes (Signed)
[facility] PROGRESS NOTE    Jeffrey Blair  TUU:[id number] DOB: 08-22-1927 DOA: 07/25/2020 PCP: [facility]      Brief Narrative:  Jeffrey Blair is a 87 y.o. M with dementia, ambulatory and home dwelling, Parkinson's disease, prostate CA in remission, COPD and TIA brought due to confusion, speech disturbance.  Initially woke with new slurred speech, seen in the ER at [facility], waiting bed for admission for stroke when he became agitated and so family brought him home.  At home, he was altered and could not follow commands, so he was brought back to [facility], and transferred to [facility] due to stroke.   Assessment & Plan:  Acute right MCA CVA Sepsis due to E coli bacteremia Acute metabolic encephalopathy Transaminitis Parkinson's disease Depression COPD Hypokalemia Hyponatremia AKI Thrombocytopenia   Admitted for stroke, MRI showed acute large infarct involving approximately 7 cm are of right MCA/PCA territory.  12/27-1/4 patient initially failed SLP evaluation and oral intake persistently very poor and not enough to keep up with hydration and nutrition needs, placement delayed.  On 1/5, there was new hypotension, worsening hemiparesis, CT showed interval expansion of stroke.  This resolved with fluids and midodrine. However, the patient had progressive weakness, poor oral intake. On 1/12, there was a palliative meeting, family elected for comfort care and residential hospice.    - Full comfort measures       Disposition: Status is: Inpatient  Remains inpatient appropriate because:IV treatments appropriate due to intensity of illness or inability to take PO   Dispo: The patient is from: Home              Anticipated d/c is to: inpatient Hospice              Patient currently is medically stable to d/c.     MDM: The below labs and imaging reports were reviewed and summarized above.  Medication management as above.    DVT  prophylaxis:   Code Status: DNR            Subjective: Asking for water  Objective: Vitals:   08/15/20 0853 08/15/20 1500 08/15/20 1941 08/16/20 0733  BP: (!) 145/67 (!) 120/53 (!) 127/52 137/61  Pulse: (!) 108  (!) 109 (!) 106  Resp: 18  18 18   Temp: (!) 100.9 F (38.3 C)  99.7 F (37.6 C) 98.9 F (37.2 C)  TempSrc: Axillary  Axillary Oral  SpO2: 94%  93% 91%  Weight:      Height:        Intake/Output Summary (Last 24 hours) at 08/16/2020 0849 Last data filed at 08/16/2020 7915 Gross per 24 hour  Intake --  Output 1450 ml  Net -1450 ml   Filed Weights   07/25/20 1925  Weight: 72.6 kg    Examination: In bed, NAD       Data Reviewed: I have personally reviewed following labs and imaging studies:  CBC: Recent Labs  Lab 08/11/20 0450 08/12/20 1101  WBC 20.5* 16.4*  HGB 11.3* 11.2*  HCT 32.5* 31.9*  MCV 83.1 80.8  PLT 53* 62*   Basic Metabolic Panel: Recent Labs  Lab 08/11/20 0450 08/12/20 1101  NA 133* 132*  K 3.4* 3.0*  CL 104 99  CO2 20* 24  GLUCOSE 112* 93  BUN 14 6*  CREATININE 0.67 0.65  CALCIUM 7.5* 7.6*   GFR: Estimated Creatinine Clearance: 63 mL/min (by C-G formula based on SCr of 0.65 mg/dL). Liver Function Tests:  Recent Labs  Lab 08/10/20 1455  AST 175*  ALT 68*  ALKPHOS 461*  BILITOT 5.3*  PROT 5.6*  ALBUMIN 2.1*   No results for input(s): LIPASE, AMYLASE in the last 168 hours. No results for input(s): AMMONIA in the last 168 hours. Coagulation Profile: No results for input(s): INR, PROTIME in the last 168 hours. Cardiac Enzymes: No results for input(s): CKTOTAL, CKMB, CKMBINDEX, TROPONINI in the last 168 hours. BNP (last 3 results) No results for input(s): PROBNP in the last 8760 hours. HbA1C: No results for input(s): HGBA1C in the last 72 hours. CBG: No results for input(s): GLUCAP in the last 168 hours. Lipid Profile: No results for input(s): CHOL, HDL, LDLCALC, TRIG, CHOLHDL, LDLDIRECT in the last 72  hours. Thyroid Function Tests: No results for input(s): TSH, T4TOTAL, FREET4, T3FREE, THYROIDAB in the last 72 hours. Anemia Panel: No results for input(s): VITAMINB12, FOLATE, FERRITIN, TIBC, IRON, RETICCTPCT in the last 72 hours. Urine analysis:    Component Value Date/Time   COLORURINE AMBER (A) 08/06/2020 1041   APPEARANCEUR CLOUDY (A) 08/06/2020 1041   LABSPEC 1.020 08/06/2020 1041   PHURINE 5.0 08/06/2020 1041   GLUCOSEU NEGATIVE 08/06/2020 1041   GLUCOSEU NEGATIVE 05/21/2015 1632   HGBUR SMALL (A) 08/06/2020 1041   BILIRUBINUR SMALL (A) 08/06/2020 1041   KETONESUR 5 (A) 08/06/2020 1041   PROTEINUR 100 (A) 08/06/2020 1041   UROBILINOGEN 0.2 05/21/2015 1632   NITRITE NEGATIVE 08/06/2020 1041   LEUKOCYTESUR NEGATIVE 08/06/2020 1041    Recent Results (from the past 240 hour(s))  Culture, Urine     Status: Abnormal   Collection Time: 08/06/20  1:14 PM   Specimen: Urine, Random  Result Value Ref Range Status   Specimen Description URINE, RANDOM  Final   Special Requests   Final    NONE Performed at [facility], [place] [address] [place], [place] [address]    Culture MULTIPLE SPECIES PRESENT, SUGGEST RECOLLECTION (A)  Final   Report Status 08/07/2020 FINAL  Final         Radiology Studies: No results found.      Scheduled Meds: . carbidopa-levodopa  3 tablet Oral BID  . feeding supplement  237 mL Oral TID BM  . midodrine  2.5 mg Oral TID WC  . pantoprazole sodium  40 mg Oral Daily  . polyethylene glycol  17 g Oral Daily  . potassium chloride  40 mEq Oral Once  . primidone  150 mg Oral BID   Continuous Infusions:    LOS: 20 days    Time spent: 35 minutes    Geradine Girt, DO [facility] 08/16/2020, 8:49 AM     Please page though [place] or Epic secure chat:  For [employer], Adult nurse

## 2020-08-17 DIAGNOSIS — I639 Cerebral infarction, unspecified: Secondary | ICD-10-CM | POA: Diagnosis not present

## 2020-08-17 NOTE — Plan of Care (Signed)
  Problem: Education: Goal: Knowledge of disease or condition will improve Outcome: Progressing Goal: Knowledge of secondary prevention will improve Outcome: Progressing Goal: Knowledge of patient specific risk factors addressed and post discharge goals established will improve Outcome: Progressing Goal: Individualized Educational Video(s) Outcome: Progressing   Problem: Education: Goal: Knowledge of the prescribed therapeutic regimen will improve Outcome: Progressing   Problem: Coping: Goal: Ability to identify and develop effective coping behavior will improve Outcome: Progressing   Problem: Clinical Measurements: Goal: Quality of life will improve Outcome: Progressing   Problem: Respiratory: Goal: Verbalizations of increased ease of respirations will increase Outcome: Progressing   Problem: Role Relationship: Goal: Family's ability to cope with current situation will improve Outcome: Progressing Goal: Ability to verbalize concerns, feelings, and thoughts to partner or family member will improve Outcome: Progressing   Problem: Pain Management: Goal: Satisfaction with pain management regimen will improve Outcome: Progressing

## 2020-08-17 NOTE — TOC Progression Note (Signed)
Transition of Care [facility]) - Progression Note    Patient Details  Name: Jeffrey Blair MRN: [id number] Date of Birth: 1928/01/21  Transition of Care [facility]) CM/SW [place], Hand Phone Number: 08/17/2020, 11:41 AM  Clinical Narrative:   CSW following for transition to residential hospice facility. CSW contacted Admissions for [facility] to check on bed availability. Still no bed available at this time. CSW to follow.    Expected Discharge Plan: [place] Barriers to Discharge: Continued Medical Work up  Expected Discharge Plan and Services Expected Discharge Plan: [place] In-house Referral: Clinical Social Work   Post Acute Care Choice: [place] Living arrangements for the past 2 months: Single Family Home                                       Social Determinants of Health (SDOH) Interventions    Readmission Risk Interventions No flowsheet data found.

## 2020-08-17 NOTE — Plan of Care (Signed)
  Problem: Nutrition: Goal: Risk of aspiration will decrease Outcome: Progressing Goal: Dietary intake will improve Outcome: Progressing   Problem: Spontaneous Subarachnoid Hemorrhage Tissue Perfusion: Goal: Complications of Spontaneous Subarachnoid Hemorrhage will be minimized Outcome: Progressing   Problem: Ischemic Stroke/TIA Tissue Perfusion: Goal: Complications of ischemic stroke/TIA will be minimized Outcome: Progressing

## 2020-08-17 NOTE — Progress Notes (Signed)
[place] [facility] PROGRESS NOTE    Jeffrey Blair  [id number]:[id number] DOB: Dec 25, 1927 DOA: 07/25/2020 PCP: [facility]      Brief Narrative:  Jeffrey Blair is a 87 y.o. M with dementia, ambulatory and home dwelling, Parkinson's disease, prostate CA in remission, COPD and TIA brought due to confusion, speech disturbance.  Initially woke with new slurred speech, seen in the ER at [facility], waiting bed for admission for stroke when he became agitated and so family brought him home.  At home, he was altered and could not follow commands, so he was brought back to [facility], and transferred to [facility] due to stroke.  Now comfort care   Assessment & Plan:  Acute right MCA CVA Sepsis due to E coli bacteremia Acute metabolic encephalopathy Transaminitis Parkinson's disease Depression COPD Hypokalemia Hyponatremia AKI Thrombocytopenia   Admitted for stroke, MRI showed acute large infarct involving approximately 7 cm are of right MCA/PCA territory.  12/27-1/4 patient initially failed SLP evaluation and oral intake persistently very poor and not enough to keep up with hydration and nutrition needs, placement delayed.  On 1/5, there was new hypotension, worsening hemiparesis, CT showed interval expansion of stroke.  This resolved with fluids and midodrine. However, the patient had progressive weakness, poor oral intake. On 1/12, there was a palliative meeting, family elected for comfort care and residential hospice.    - Full comfort measures       Disposition: Status is: Inpatient  Remains inpatient appropriate because:IV treatments appropriate due to intensity of illness or inability to take PO   Dispo: The patient is from: Home              Anticipated d/c is to: inpatient Hospice              Patient currently is medically stable to d/c.      Code Status: DNR     Subjective: Talking to delf  Objective: Vitals:   08/15/20 1500 08/15/20 1941  08/16/20 0733 08/16/20 2015  BP: (!) 120/53 (!) 127/52 137/61 137/68  Pulse:  (!) 109 (!) 106 (!) 120  Resp:  18 18 18   Temp:   98.9 F (37.2 C) 99.1 F (37.3 C)  TempSrc:  Axillary Oral Oral  SpO2:  93% 91% 95%  Weight:      Height:        Intake/Output Summary (Last 24 hours) at 08/17/2020 0858 Last data filed at 08/17/2020 0148 Gross per 24 hour  Intake -  Output 1600 ml  Net -1600 ml   Filed Weights   07/25/20 1925  Weight: 72.6 kg    Examination:  in bed, appears comfortable tachy       Data Reviewed: I have personally reviewed following labs and imaging studies:  CBC: Recent Labs  Lab 08/11/20 0450 08/12/20 1101  WBC 20.5* 16.4*  HGB 11.3* 11.2*  HCT 32.5* 31.9*  MCV 83.1 80.8  PLT 53* 62*   Basic Metabolic Panel: Recent Labs  Lab 08/11/20 0450 08/12/20 1101  NA 133* 132*  K 3.4* 3.0*  CL 104 99  CO2 20* 24  GLUCOSE 112* 93  BUN 14 6*  CREATININE 0.67 0.65  CALCIUM 7.5* 7.6*   GFR: Estimated Creatinine Clearance: 63 mL/min (by C-G formula based on SCr of 0.65 mg/dL). Liver Function Tests: Recent Labs  Lab 08/10/20 1455  AST 175*  ALT 68*  ALKPHOS 461*  BILITOT 5.3*  PROT 5.6*  ALBUMIN 2.1*   No results  for input(s): LIPASE, AMYLASE in the last 168 hours. No results for input(s): AMMONIA in the last 168 hours. Coagulation Profile: No results for input(s): INR, PROTIME in the last 168 hours. Cardiac Enzymes: No results for input(s): CKTOTAL, CKMB, CKMBINDEX, TROPONINI in the last 168 hours. BNP (last 3 results) No results for input(s): PROBNP in the last 8760 hours. HbA1C: No results for input(s): HGBA1C in the last 72 hours. CBG: No results for input(s): GLUCAP in the last 168 hours. Lipid Profile: No results for input(s): CHOL, HDL, LDLCALC, TRIG, CHOLHDL, LDLDIRECT in the last 72 hours. Thyroid Function Tests: No results for input(s): TSH, T4TOTAL, FREET4, T3FREE, THYROIDAB in the last 72 hours. Anemia Panel: No results  for input(s): VITAMINB12, FOLATE, FERRITIN, TIBC, IRON, RETICCTPCT in the last 72 hours. Urine analysis:    Component Value Date/Time   COLORURINE AMBER (A) 08/06/2020 1041   APPEARANCEUR CLOUDY (A) 08/06/2020 1041   LABSPEC 1.020 08/06/2020 1041   PHURINE 5.0 08/06/2020 1041   GLUCOSEU NEGATIVE 08/06/2020 1041   GLUCOSEU NEGATIVE 05/21/2015 1632   HGBUR SMALL (A) 08/06/2020 1041   BILIRUBINUR SMALL (A) 08/06/2020 1041   KETONESUR 5 (A) 08/06/2020 1041   PROTEINUR 100 (A) 08/06/2020 1041   UROBILINOGEN 0.2 05/21/2015 1632   NITRITE NEGATIVE 08/06/2020 1041   LEUKOCYTESUR NEGATIVE 08/06/2020 1041    No results found for this or any previous visit (from the past 240 hour(s)).       Radiology Studies: No results found.      Scheduled Meds: . carbidopa-levodopa  3 tablet Oral BID  . feeding supplement  237 mL Oral TID BM  . midodrine  2.5 mg Oral TID WC  . pantoprazole sodium  40 mg Oral Daily  . polyethylene glycol  17 g Oral Daily  . potassium chloride  40 mEq Oral Once  . primidone  150 mg Oral BID   Continuous Infusions:    LOS: 21 days    Time spent: 35 minutes    Geradine Girt, DO [facility] 08/17/2020, 8:58 AM     Please page though AMION or Epic secure chat:  For [employer], Adult nurse

## 2020-08-18 DIAGNOSIS — I639 Cerebral infarction, unspecified: Secondary | ICD-10-CM | POA: Diagnosis not present

## 2020-08-18 NOTE — Progress Notes (Signed)
[place] [facility] PROGRESS NOTE    Jeffrey Blair  KZS:[id number] DOB: [birth year] DOA: 07/25/2020 PCP: [facility]      Brief Narrative:  [name] is a 87 y.o. M with dementia, ambulatory and home dwelling, Parkinson's disease, prostate CA in remission, COPD and TIA brought due to confusion, speech disturbance. Brought to [facility], and transferred to [facility] due to stroke.  Now comfort care   Assessment & Plan:  Acute right MCA CVA Sepsis due to E coli bacteremia Acute metabolic encephalopathy Transaminitis Parkinson's disease Depression COPD Hypokalemia Hyponatremia AKI Thrombocytopenia   Admitted for stroke, MRI showed acute large infarct involving approximately 7 cm are of right MCA/PCA territory.  12/27-1/4 patient initially failed SLP evaluation and oral intake persistently very poor and not enough to keep up with hydration and nutrition needs, placement delayed.  On 1/5, there was new hypotension, worsening hemiparesis, CT showed interval expansion of stroke.  This resolved with fluids and midodrine. However, the patient had progressive weakness, poor oral intake. On 1/12, there was a palliative meeting, family elected for comfort care and residential hospice.    - Full comfort measures     Disposition: Status is: Inpatient  Remains inpatient appropriate because:IV treatments appropriate due to intensity of illness or inability to take PO   Dispo: The patient is from: Home              Anticipated d/c is to: inpatient Hospice              Patient currently is medically stable to d/c.      Code Status: DNR     Subjective: resting  Objective: Vitals:   08/17/20 1034 08/17/20 1308 08/17/20 2102 08/18/20 0446  BP: 138/66 133/68 138/79 (!) 149/71  Pulse: (!) 109 (!) 109 (!) 101 (!) 107  Resp: 16 16 18 20   Temp: 98.7 F (37.1 C) 98.6 F (37 C) 97.6 F (36.4 C) 98.1 F (36.7 C)  TempSrc: Axillary Axillary Axillary Axillary   SpO2: 93% 94% 96% 94%  Weight:      Height:        Intake/Output Summary (Last 24 hours) at 08/18/2020 1027 Last data filed at 08/17/2020 1908 Gross per 24 hour  Intake -  Output 850 ml  Net -850 ml   Filed Weights   07/25/20 1925  Weight: 72.6 kg    Examination: In bed, not responsive to voice or touch Appear comfortable Poor dentition    Data Reviewed: I have personally reviewed following labs and imaging studies:  CBC: Recent Labs  Lab 08/12/20 1101  WBC 16.4*  HGB 11.2*  HCT 31.9*  MCV 80.8  PLT 62*   Basic Metabolic Panel: Recent Labs  Lab 08/12/20 1101  NA 132*  K 3.0*  CL 99  CO2 24  GLUCOSE 93  BUN 6*  CREATININE 0.65  CALCIUM 7.6*   GFR: Estimated Creatinine Clearance: 63 mL/min (by C-G formula based on SCr of 0.65 mg/dL). Liver Function Tests: No results for input(s): AST, ALT, ALKPHOS, BILITOT, PROT, ALBUMIN in the last 168 hours. No results for input(s): LIPASE, AMYLASE in the last 168 hours. No results for input(s): AMMONIA in the last 168 hours. Coagulation Profile: No results for input(s): INR, PROTIME in the last 168 hours. Cardiac Enzymes: No results for input(s): CKTOTAL, CKMB, CKMBINDEX, TROPONINI in the last 168 hours. BNP (last 3 results) No results for input(s): PROBNP in the last 8760 hours. HbA1C: No results for input(s): HGBA1C in  the last 72 hours. CBG: No results for input(s): GLUCAP in the last 168 hours. Lipid Profile: No results for input(s): CHOL, HDL, LDLCALC, TRIG, CHOLHDL, LDLDIRECT in the last 72 hours. Thyroid Function Tests: No results for input(s): TSH, T4TOTAL, FREET4, T3FREE, THYROIDAB in the last 72 hours. Anemia Panel: No results for input(s): VITAMINB12, FOLATE, FERRITIN, TIBC, IRON, RETICCTPCT in the last 72 hours. Urine analysis:    Component Value Date/Time   COLORURINE AMBER (A) 08/06/2020 1041   APPEARANCEUR CLOUDY (A) 08/06/2020 1041   LABSPEC 1.020 08/06/2020 1041   PHURINE 5.0 08/06/2020  1041   GLUCOSEU NEGATIVE 08/06/2020 1041   GLUCOSEU NEGATIVE 05/21/2015 1632   HGBUR SMALL (A) 08/06/2020 1041   BILIRUBINUR SMALL (A) 08/06/2020 1041   KETONESUR 5 (A) 08/06/2020 1041   PROTEINUR 100 (A) 08/06/2020 1041   UROBILINOGEN 0.2 05/21/2015 1632   NITRITE NEGATIVE 08/06/2020 1041   LEUKOCYTESUR NEGATIVE 08/06/2020 1041    No results found for this or any previous visit (from the past 240 hour(s)).       Radiology Studies: No results found.      Scheduled Meds: . carbidopa-levodopa  3 tablet Oral BID  . feeding supplement  237 mL Oral TID BM  . midodrine  2.5 mg Oral TID WC  . pantoprazole sodium  40 mg Oral Daily  . polyethylene glycol  17 g Oral Daily  . potassium chloride  40 mEq Oral Once  . primidone  150 mg Oral BID   Continuous Infusions:    LOS: 22 days    Time spent: 35 minutes    Geradine Girt, DO [facility] 08/18/2020, 10:27 AM     Please page though [place] or Epic secure chat:  For [employer], Adult nurse

## 2020-08-19 DIAGNOSIS — R627 Adult failure to thrive: Secondary | ICD-10-CM | POA: Diagnosis not present

## 2020-08-19 DIAGNOSIS — R451 Restlessness and agitation: Secondary | ICD-10-CM

## 2020-08-19 DIAGNOSIS — Z515 Encounter for palliative care: Secondary | ICD-10-CM | POA: Diagnosis not present

## 2020-08-19 DIAGNOSIS — I639 Cerebral infarction, unspecified: Secondary | ICD-10-CM | POA: Diagnosis not present

## 2020-08-19 DIAGNOSIS — R52 Pain, unspecified: Secondary | ICD-10-CM

## 2020-08-19 NOTE — TOC Progression Note (Signed)
Transition of Care [facility]) - Progression Note    Patient Details  Name: KIRKE BREACH MRN: [id number] Date of Birth: 1927/09/30  Transition of Care [facility]) CM/SW [place], [place] Phone Number: 08/19/2020, 4:22 PM  Clinical Narrative:   CSW contacted [facility], and there are still no beds available today. CSW to follow.    Expected Discharge Plan: [place] Barriers to Discharge: Continued Medical Work up  Expected Discharge Plan and Services Expected Discharge Plan: [place] In-house Referral: Clinical Social Work   Post Acute Care Choice: [place] Living arrangements for the past 2 months: Single Family Home                                       Social Determinants of Health (SDOH) Interventions    Readmission Risk Interventions No flowsheet data found.

## 2020-08-19 NOTE — Progress Notes (Signed)
Patient ID: LAEL PILCH, male   DOB: 1927/11/02, 87 y.o.   MRN: [id number]  Medical records reviewed.   This NP visited patient at the bedside as a follow up for palliative medicine  needs and emotional support.  Patient continues to decline physically, functionally and cognitively.  He is weak and lethargic, taking only sips by mouth he is requiring as needed medications to treat his symptoms of pain and agitation.  Education with nursing/bedsdie RN regarding utilization of as needed medications for symptom management.   Focus of care is  comfort and dignity as documented on 08-12-20  Spoke to son [name] by telephone and updated him on his father's current medical condition.   Son verbalizes an understanding of the current medical situation and that his father is transitioning at end-of-life,prognosis is likely days to a week.  Education offered regarding the natural trajectory and expectations at end of life  Questions and concerns addressed.  Awaits hospice bed in [place]  Total time spent on the unit was 25 minutes  PMT will continue to support holistically  Greater than 50% of the time was spent in counseling and coordination of care  Wadie Lessen NP  Palliative Medicine Team Team Phone # [phone] Pager [phone]

## 2020-08-19 NOTE — Progress Notes (Addendum)
PROGRESS NOTE    Jeffrey Blair  [id number]:[id number] DOB: 1927-09-01 DOA: 07/25/2020 PCP: [facility]   Brief Narrative:  Jeffrey Blair is a 87 y.o. M with dementia, ambulatory and home dwelling, Parkinson's disease, prostate CA in remission, COPD and TIA brought due to confusion, speech disturbance. Brought to [facility], and transferred to [facility] due to stroke.  Now comfort care  Assessment & Plan:   Acute right MCA stroke Sepsis due to E. coli bacteremia Acute metabolic encephalopathy Elevated LFTs Parkinson's disease Depression COPD Hypokalemia Hyponatremia AKI Thrombocytopenia  Acute right MCA stroke: -MRI showed acute large infarct involving approximately 7 cm right MCA/PCA territory.  Patient initially failed SLP evaluation and oral intake persistently very poor and not enough to keep up with hydration and nutrition needs. -On 1/5: There was new hypotension-that was resolved with IV fluids and midodrine.  There was also worsening hemiparesis.  CT head showed interval no expansion of the stroke.  Due to progressive weakness and poor oral intake-on 1/12 there was a palliative meeting with the family who elected for complete comfort care and residential hospice.  Full comfort measures-awaits residential hospice facility  Continue Sinemet, primidone Continue Ativan, hydromorphone as needed for anxiety and pain respectively  DVT prophylaxis: None Code Status: DNR Family Communication:  None present at bedside.  Plan of care discussed with patient in length and he verbalized understanding and agreed with it. Disposition Plan: Hospice  Consultants:   Neurology  Palliative care  Procedures:  MRI brain CT head Antimicrobials:   As per chart  Status is: Inpatient   Dispo: The patient is from: Home              Anticipated d/c is to: Hospice              Anticipated d/c date is: 2 days              Patient currently is medically stable to  d/c.         Subjective: Patient seen and examined.  Resting comfortably on the bed.  Not in acute distress.  No acute events overnight.  Objective: Vitals:   08/18/20 0446 08/18/20 1036 08/18/20 1933 08/19/20 0745  BP: (!) 149/71 (!) 151/91 126/79 (!) 139/59  Pulse: (!) 107 (!) 101 99 (!) 58  Resp: 20  16 16   Temp: 98.1 F (36.7 C) 97.9 F (36.6 C) 99.7 F (37.6 C) 98 F (36.7 C)  TempSrc: Axillary Axillary Oral Axillary  SpO2: 94% 97% 98% 94%  Weight:      Height:        Intake/Output Summary (Last 24 hours) at 08/19/2020 1406 Last data filed at 08/19/2020 0332 Gross per 24 hour  Intake 200 ml  Output 2900 ml  Net -2700 ml   Filed Weights   07/25/20 1925  Weight: 72.6 kg    Examination:  General exam: Appears calm and comfortable, on room air, open his eyes with verbal command and following commands.  Has poor dentition.  Very thin and lean. Respiratory system: Clear to auscultation. Respiratory effort normal. Cardiovascular system: S1 & S2 heard, RRR. No JVD, murmurs, rubs, gallops or clicks. No pedal edema. Gastrointestinal system: Abdomen is nondistended, soft and nontender. No organomegaly or masses felt. Normal bowel sounds heard. Central nervous system: Alert and awake and following commands.   Data Reviewed: I have personally reviewed following labs and imaging studies  CBC: No results for input(s): WBC, NEUTROABS, HGB, HCT, MCV, PLT in the last  168 hours. Basic Metabolic Panel: No results for input(s): NA, K, CL, CO2, GLUCOSE, BUN, CREATININE, CALCIUM, MG, PHOS in the last 168 hours. GFR: Estimated Creatinine Clearance: 63 mL/min (by C-G formula based on SCr of 0.65 mg/dL). Liver Function Tests: No results for input(s): AST, ALT, ALKPHOS, BILITOT, PROT, ALBUMIN in the last 168 hours. No results for input(s): LIPASE, AMYLASE in the last 168 hours. No results for input(s): AMMONIA in the last 168 hours. Coagulation Profile: No results for input(s):  INR, PROTIME in the last 168 hours. Cardiac Enzymes: No results for input(s): CKTOTAL, CKMB, CKMBINDEX, TROPONINI in the last 168 hours. BNP (last 3 results) No results for input(s): PROBNP in the last 8760 hours. HbA1C: No results for input(s): HGBA1C in the last 72 hours. CBG: No results for input(s): GLUCAP in the last 168 hours. Lipid Profile: No results for input(s): CHOL, HDL, LDLCALC, TRIG, CHOLHDL, LDLDIRECT in the last 72 hours. Thyroid Function Tests: No results for input(s): TSH, T4TOTAL, FREET4, T3FREE, THYROIDAB in the last 72 hours. Anemia Panel: No results for input(s): VITAMINB12, FOLATE, FERRITIN, TIBC, IRON, RETICCTPCT in the last 72 hours. Sepsis Labs: No results for input(s): PROCALCITON, LATICACIDVEN in the last 168 hours.  No results found for this or any previous visit (from the past 240 hour(s)).    Radiology Studies: No results found.  Scheduled Meds: . carbidopa-levodopa  3 tablet Oral BID  . feeding supplement  237 mL Oral TID BM  . midodrine  2.5 mg Oral TID WC  . pantoprazole sodium  40 mg Oral Daily  . polyethylene glycol  17 g Oral Daily  . potassium chloride  40 mEq Oral Once  . primidone  150 mg Oral BID   Continuous Infusions:   LOS: 23 days   Time spent: 35 minutes   Reighlynn Swiney Loann Quill, MD [facility]  If 7PM-7AM, please contact night-coverage [email or web address] 08/19/2020, 2:06 PM

## 2020-08-19 NOTE — Plan of Care (Signed)
  Problem: Education: Goal: Knowledge of disease or condition will improve Outcome: Progressing Goal: Knowledge of secondary prevention will improve Outcome: Progressing Goal: Knowledge of patient specific risk factors addressed and post discharge goals established will improve Outcome: Progressing Goal: Individualized Educational Video(s) Outcome: Progressing   Problem: Coping: Goal: Will verbalize positive feelings about self Outcome: Progressing   Problem: Self-Care: Goal: Ability to participate in self-care as condition permits will improve Outcome: Progressing Goal: Verbalization of feelings and concerns over difficulty with self-care will improve Outcome: Progressing Goal: Ability to communicate needs accurately will improve Outcome: Progressing   Problem: Safety: Goal: Ability to remain free from injury will improve Outcome: Progressing   Problem: Skin Integrity: Goal: Risk for impaired skin integrity will decrease Outcome: Progressing   Problem: Education: Goal: Knowledge of the prescribed therapeutic regimen will improve Outcome: Progressing

## 2020-08-20 DIAGNOSIS — I639 Cerebral infarction, unspecified: Secondary | ICD-10-CM | POA: Diagnosis not present

## 2020-08-20 NOTE — TOC Progression Note (Signed)
Transition of Care [facility]) - Progression Note    Patient Details  Name: Jeffrey Blair MRN: [id number] Date of Birth: Jun 09, 1928  [facility] CM/SW Contact  Pollie Friar, RN Phone Number: 08/20/2020, 12:15 PM  Clinical Narrative:    No beds at [facility] [place]. TOC following.   Expected Discharge Plan: [place] Barriers to Discharge: Continued Medical Work up  Expected Discharge Plan and Services Expected Discharge Plan: [place] In-house Referral: Clinical Social Work   Post Acute Care Choice: [place] Living arrangements for the past 2 months: Single Family Home                                       Social Determinants of Health (SDOH) Interventions    Readmission Risk Interventions No flowsheet data found.

## 2020-08-20 NOTE — Progress Notes (Signed)
PROGRESS NOTE    Jeffrey Blair  IRS:[id number] DOB: 06/26/1928 DOA: 07/25/2020 PCP: [facility]   Brief Narrative:  Jeffrey Blair is a 87 y.o. M with dementia, ambulatory and home dwelling, Parkinson's disease, prostate CA in remission, COPD and TIA brought due to confusion, speech disturbance. Brought to [facility], and transferred to [facility] due to stroke.  Now comfort care  Assessment & Plan:   Acute right MCA stroke Sepsis due to E. coli bacteremia Acute metabolic encephalopathy Elevated LFTs Parkinson's disease Depression COPD Hypokalemia Hyponatremia AKI Thrombocytopenia  Acute right MCA stroke: -MRI showed acute large infarct involving approximately 7 cm right MCA/PCA territory.  Patient initially failed SLP evaluation and oral intake persistently very poor and not enough to keep up with hydration and nutrition needs. -On 1/5: There was new hypotension-that was resolved with IV fluids and midodrine.  There was also worsening hemiparesis.  CT head showed interval no expansion of the stroke.  Due to progressive weakness and poor oral intake-on 1/12 there was a palliative meeting with the family who elected for complete comfort care and residential hospice.  Full comfort measures-awaits hospice bed in [facility]  Continue Sinemet, primidone Continue Ativan, hydromorphone as needed for anxiety and pain respectively  DVT prophylaxis: None Code Status: DNR Family Communication:  None present at bedside.  Plan of care discussed with patient in length and he verbalized understanding and agreed with it. Disposition Plan: Hospice  Consultants:   Neurology  Palliative care  Procedures:  MRI brain CT head Antimicrobials:   As per chart  Status is: Inpatient   Dispo: The patient is from: Home              Anticipated d/c is to: Hospice              Anticipated d/c date is: 2 days              Patient currently is medically stable to  d/c.   Subjective: Patient seen and examined.  Appears weak, lethargic.  Has poor p.o. intake.  No acute events overnight.    Objective: Vitals:   08/18/20 1933 08/19/20 0745 08/19/20 2006 08/20/20 0807  BP: 126/79 (!) 139/59 (!) 148/55 (!) 156/75  Pulse: 99 (!) 58 63 (!) 122  Resp: 16 16 18 20   Temp: 99.7 F (37.6 C) 98 F (36.7 C) 98.3 F (36.8 C) (!) 97.5 F (36.4 C)  TempSrc: Oral Axillary Axillary Oral  SpO2: 98% 94% 94%   Weight:      Height:        Intake/Output Summary (Last 24 hours) at 08/20/2020 1208 Last data filed at 08/20/2020 0327 Gross per 24 hour  Intake -  Output 1150 ml  Net -1150 ml   Filed Weights   07/25/20 1925  Weight: 72.6 kg    Examination:  General exam: Appears calm and comfortable, on room air, opens his eyes with verbal command and following commands.  Has poor dentition.  Very thin and lean. Respiratory system: Clear to auscultation. Respiratory effort normal. Cardiovascular system: S1 & S2 heard, RRR. No JVD, murmurs, rubs, gallops or clicks. No pedal edema. Gastrointestinal system: Abdomen is nondistended, soft and nontender. No organomegaly or masses felt. Normal bowel sounds heard. Central nervous system: Alert and awake and following commands.   Data Reviewed: I have personally reviewed following labs and imaging studies  CBC: No results for input(s): WBC, NEUTROABS, HGB, HCT, MCV, PLT in the last 168 hours. Basic Metabolic Panel:  No results for input(s): NA, K, CL, CO2, GLUCOSE, BUN, CREATININE, CALCIUM, MG, PHOS in the last 168 hours. GFR: Estimated Creatinine Clearance: 63 mL/min (by C-G formula based on SCr of 0.65 mg/dL). Liver Function Tests: No results for input(s): AST, ALT, ALKPHOS, BILITOT, PROT, ALBUMIN in the last 168 hours. No results for input(s): LIPASE, AMYLASE in the last 168 hours. No results for input(s): AMMONIA in the last 168 hours. Coagulation Profile: No results for input(s): INR, PROTIME in the last  168 hours. Cardiac Enzymes: No results for input(s): CKTOTAL, CKMB, CKMBINDEX, TROPONINI in the last 168 hours. BNP (last 3 results) No results for input(s): PROBNP in the last 8760 hours. HbA1C: No results for input(s): HGBA1C in the last 72 hours. CBG: No results for input(s): GLUCAP in the last 168 hours. Lipid Profile: No results for input(s): CHOL, HDL, LDLCALC, TRIG, CHOLHDL, LDLDIRECT in the last 72 hours. Thyroid Function Tests: No results for input(s): TSH, T4TOTAL, FREET4, T3FREE, THYROIDAB in the last 72 hours. Anemia Panel: No results for input(s): VITAMINB12, FOLATE, FERRITIN, TIBC, IRON, RETICCTPCT in the last 72 hours. Sepsis Labs: No results for input(s): PROCALCITON, LATICACIDVEN in the last 168 hours.  No results found for this or any previous visit (from the past 240 hour(s)).    Radiology Studies: No results found.  Scheduled Meds: . carbidopa-levodopa  3 tablet Oral BID  . feeding supplement  237 mL Oral TID BM  . midodrine  2.5 mg Oral TID WC  . pantoprazole sodium  40 mg Oral Daily  . polyethylene glycol  17 g Oral Daily  . potassium chloride  40 mEq Oral Once  . primidone  150 mg Oral BID   Continuous Infusions:   LOS: 24 days   Time spent: 35 minutes   Rinka Loann Quill, MD [facility]  If 7PM-7AM, please contact night-coverage [email or web address] 08/20/2020, 12:08 PM

## 2020-08-21 DIAGNOSIS — I639 Cerebral infarction, unspecified: Secondary | ICD-10-CM | POA: Diagnosis not present

## 2020-08-21 NOTE — TOC Progression Note (Signed)
Transition of Care [facility]) - Progression Note    Patient Details  Name: Jeffrey Blair MRN: [id number] Date of Birth: [birth year]  Transition of Care [facility]) CM/SW [place], [place] Phone Number: 08/21/2020, 1:25 PM  Clinical Narrative:   CSW contacted [facility], and there is still no bed available for the patient today. He is next on the wait list for when a bed becomes available. CSW to continue to follow.    Expected Discharge Plan: [place] Barriers to Discharge: Continued Medical Work up  Expected Discharge Plan and Services Expected Discharge Plan: [place] In-house Referral: Clinical Social Work   Post Acute Care Choice: [place] Living arrangements for the past 2 months: Single Family Home                                       Social Determinants of Health (SDOH) Interventions    Readmission Risk Interventions No flowsheet data found.

## 2020-08-21 NOTE — Plan of Care (Signed)
  Problem: Education: Goal: Knowledge of disease or condition will improve Outcome: Progressing Goal: Knowledge of secondary prevention will improve Outcome: Progressing Goal: Knowledge of patient specific risk factors addressed and post discharge goals established will improve Outcome: Progressing Goal: Individualized Educational Video(s) Outcome: Progressing   Problem: Coping: Goal: Will verbalize positive feelings about self Outcome: Progressing   Problem: Health Behavior/Discharge Planning: Goal: Ability to manage health-related needs will improve Outcome: Progressing   Problem: Self-Care: Goal: Ability to participate in self-care as condition permits will improve Outcome: Progressing Goal: Verbalization of feelings and concerns over difficulty with self-care will improve Outcome: Progressing Goal: Ability to communicate needs accurately will improve Outcome: Progressing   Problem: Nutrition: Goal: Risk of aspiration will decrease Outcome: Progressing Goal: Dietary intake will improve Outcome: Progressing   Problem: Ischemic Stroke/TIA Tissue Perfusion: Goal: Complications of ischemic stroke/TIA will be minimized Outcome: Progressing   Problem: Education: Goal: Knowledge of disease or condition will improve Outcome: Progressing   Problem: Intracerebral Hemorrhage Tissue Perfusion: Goal: Complications of Intracerebral Hemorrhage will be minimized Outcome: Progressing   Problem: Ischemic Stroke/TIA Tissue Perfusion: Goal: Complications of ischemic stroke/TIA will be minimized Outcome: Progressing   Problem: Spontaneous Subarachnoid Hemorrhage Tissue Perfusion: Goal: Complications of Spontaneous Subarachnoid Hemorrhage will be minimized Outcome: Progressing   Problem: Education: Goal: Knowledge of the prescribed therapeutic regimen will improve Outcome: Progressing   Problem: Coping: Goal: Ability to identify and develop effective coping behavior will  improve Outcome: Progressing   Problem: Clinical Measurements: Goal: Quality of life will improve Outcome: Progressing   Problem: Respiratory: Goal: Verbalizations of increased ease of respirations will increase Outcome: Progressing   Problem: Role Relationship: Goal: Family's ability to cope with current situation will improve Outcome: Progressing Goal: Ability to verbalize concerns, feelings, and thoughts to partner or family member will improve Outcome: Progressing   Problem: Pain Management: Goal: Satisfaction with pain management regimen will improve Outcome: Progressing   

## 2020-08-21 NOTE — Progress Notes (Signed)
PROGRESS NOTE    Jeffrey Blair  IDP:[id number] DOB: Oct 15, 1927 DOA: 07/25/2020 PCP: [facility]   Brief Narrative:  Jeffrey Blair is a 87 y.o. M with dementia, ambulatory and home dwelling, Parkinson's disease, prostate CA in remission, COPD and TIA brought due to confusion, speech disturbance. Brought to [facility], and transferred to [facility] due to stroke.  Now comfort care  Assessment & Plan:   Acute right MCA stroke Sepsis due to E. coli bacteremia Acute metabolic encephalopathy Elevated LFTs Parkinson's disease Depression COPD Hypokalemia Hyponatremia AKI Thrombocytopenia  Acute right MCA stroke: -MRI showed acute large infarct involving approximately 7 cm right MCA/PCA territory.  Patient initially failed SLP evaluation and oral intake persistently very poor and not enough to keep up with hydration and nutrition needs. -On 1/5: There was new hypotension-that was resolved with IV fluids and midodrine.  There was also worsening hemiparesis.  CT head showed interval no expansion of the stroke.  Due to progressive weakness and poor oral intake-on 1/12 there was a palliative meeting with the family who elected for complete comfort care and residential hospice.  Full comfort measures-awaits hospice bed in [facility].  Continue Sinemet, primidone Continue Ativan, hydromorphone as needed for anxiety and pain respectively  DVT prophylaxis: None Code Status: DNR Family Communication:  None present at bedside.  Plan of care discussed with patient in length and he verbalized understanding and agreed with it. Disposition Plan: Hospice in [facility] as soon as bed is available.  Consultants:   Neurology  Palliative care  Procedures:  MRI brain CT head Antimicrobials:   As per chart  Status is: Inpatient   Dispo: The patient is from: Home              Anticipated d/c is to: Hospice              Anticipated d/c date is: 2 days              Patient  currently is medically stable to d/c.   Subjective: Patient seen and examined.  Appears weak, lethargic.  Has poor p.o. intake.  No acute events overnight.  Tells me that he is hurting very bad. Objective: Vitals:   08/19/20 2006 08/20/20 0807 08/20/20 1938 08/21/20 0732  BP: (!) 148/55 (!) 156/75 115/61 (!) 146/61  Pulse: 63 (!) 122 (!) 129 93  Resp: 18 20 18 20   Temp: 98.3 F (36.8 C) (!) 97.5 F (36.4 C) 98.5 F (36.9 C) 97.7 F (36.5 C)  TempSrc: Axillary Oral Axillary Oral  SpO2: 94%  92% 96%  Weight:      Height:        Intake/Output Summary (Last 24 hours) at 08/21/2020 1329 Last data filed at 08/21/2020 0415 Gross per 24 hour  Intake --  Output 700 ml  Net -700 ml   Filed Weights   07/25/20 1925  Weight: 72.6 kg    Examination:  General exam: Appears calm and comfortable, on room air, opens his eyes with verbal command   Has poor dentition.  Very thin and lean, weak and lethargic. Respiratory system: Clear to auscultation. Respiratory effort normal. Cardiovascular system: S1 & S2 heard, RRR. No JVD, murmurs, rubs, gallops or clicks. No pedal edema. Gastrointestinal system: Abdomen is nondistended, soft and nontender. No organomegaly or masses felt. Normal bowel sounds heard. Central nervous system: Alert and awake and following commands.   Data Reviewed: I have personally reviewed following labs and imaging studies  CBC: No results  for input(s): WBC, NEUTROABS, HGB, HCT, MCV, PLT in the last 168 hours. Basic Metabolic Panel: No results for input(s): NA, K, CL, CO2, GLUCOSE, BUN, CREATININE, CALCIUM, MG, PHOS in the last 168 hours. GFR: Estimated Creatinine Clearance: 63 mL/min (by C-G formula based on SCr of 0.65 mg/dL). Liver Function Tests: No results for input(s): AST, ALT, ALKPHOS, BILITOT, PROT, ALBUMIN in the last 168 hours. No results for input(s): LIPASE, AMYLASE in the last 168 hours. No results for input(s): AMMONIA in the last 168  hours. Coagulation Profile: No results for input(s): INR, PROTIME in the last 168 hours. Cardiac Enzymes: No results for input(s): CKTOTAL, CKMB, CKMBINDEX, TROPONINI in the last 168 hours. BNP (last 3 results) No results for input(s): PROBNP in the last 8760 hours. HbA1C: No results for input(s): HGBA1C in the last 72 hours. CBG: No results for input(s): GLUCAP in the last 168 hours. Lipid Profile: No results for input(s): CHOL, HDL, LDLCALC, TRIG, CHOLHDL, LDLDIRECT in the last 72 hours. Thyroid Function Tests: No results for input(s): TSH, T4TOTAL, FREET4, T3FREE, THYROIDAB in the last 72 hours. Anemia Panel: No results for input(s): VITAMINB12, FOLATE, FERRITIN, TIBC, IRON, RETICCTPCT in the last 72 hours. Sepsis Labs: No results for input(s): PROCALCITON, LATICACIDVEN in the last 168 hours.  No results found for this or any previous visit (from the past 240 hour(s)).    Radiology Studies: No results found.  Scheduled Meds:  carbidopa-levodopa  3 tablet Oral BID   feeding supplement  237 mL Oral TID BM   midodrine  2.5 mg Oral TID WC   pantoprazole sodium  40 mg Oral Daily   polyethylene glycol  17 g Oral Daily   potassium chloride  40 mEq Oral Once   primidone  150 mg Oral BID   Continuous Infusions:   LOS: 25 days   Time spent: 35 minutes   Jeffrey Mccabe Loann Quill, MD [facility]  If 7PM-7AM, please contact night-coverage [email or web address] 08/21/2020, 1:29 PM

## 2020-08-22 DIAGNOSIS — I639 Cerebral infarction, unspecified: Secondary | ICD-10-CM | POA: Diagnosis not present

## 2020-08-22 MED ORDER — HYDROMORPHONE HCL 1 MG/ML IJ SOLN
1.0000 mg | INTRAMUSCULAR | Status: DC | PRN
  Administered 2020-08-23 – 2020-08-26 (×13): 1 mg via INTRAVENOUS
  Filled 2020-08-22 (×13): qty 1

## 2020-08-22 NOTE — Progress Notes (Signed)
Patient with groaning episodes despite dilaudid 0.5mg . MD made aware.Recieved order too increase dilaudid to 1 mg.

## 2020-08-22 NOTE — Plan of Care (Signed)
  Problem: Education: Goal: Knowledge of disease or condition will improve Outcome: Progressing Goal: Knowledge of secondary prevention will improve Outcome: Progressing Goal: Knowledge of patient specific risk factors addressed and post discharge goals established will improve Outcome: Progressing Goal: Individualized Educational Video(s) Outcome: Progressing   Problem: Coping: Goal: Will verbalize positive feelings about self Outcome: Progressing   Problem: Health Behavior/Discharge Planning: Goal: Ability to manage health-related needs will improve Outcome: Progressing   Problem: Self-Care: Goal: Ability to participate in self-care as condition permits will improve Outcome: Progressing Goal: Verbalization of feelings and concerns over difficulty with self-care will improve Outcome: Progressing Goal: Ability to communicate needs accurately will improve Outcome: Progressing   Problem: Nutrition: Goal: Risk of aspiration will decrease Outcome: Progressing Goal: Dietary intake will improve Outcome: Progressing   Problem: Ischemic Stroke/TIA Tissue Perfusion: Goal: Complications of ischemic stroke/TIA will be minimized Outcome: Progressing   Problem: Education: Goal: Knowledge of disease or condition will improve Outcome: Progressing   Problem: Intracerebral Hemorrhage Tissue Perfusion: Goal: Complications of Intracerebral Hemorrhage will be minimized Outcome: Progressing   Problem: Ischemic Stroke/TIA Tissue Perfusion: Goal: Complications of ischemic stroke/TIA will be minimized Outcome: Progressing   Problem: Spontaneous Subarachnoid Hemorrhage Tissue Perfusion: Goal: Complications of Spontaneous Subarachnoid Hemorrhage will be minimized Outcome: Progressing   Problem: Education: Goal: Knowledge of the prescribed therapeutic regimen will improve Outcome: Progressing   Problem: Coping: Goal: Ability to identify and develop effective coping behavior will  improve Outcome: Progressing   Problem: Clinical Measurements: Goal: Quality of life will improve Outcome: Progressing   Problem: Respiratory: Goal: Verbalizations of increased ease of respirations will increase Outcome: Progressing   Problem: Role Relationship: Goal: Family's ability to cope with current situation will improve Outcome: Progressing Goal: Ability to verbalize concerns, feelings, and thoughts to partner or family member will improve Outcome: Progressing   Problem: Pain Management: Goal: Satisfaction with pain management regimen will improve Outcome: Progressing

## 2020-08-22 NOTE — Progress Notes (Signed)
PROGRESS NOTE    Jeffrey Blair  QJJ:[id number] DOB: 08/02/1927 DOA: 07/25/2020 PCP: [facility]   Brief Narrative:  Jeffrey Blair is a 87 y.o. M with dementia, ambulatory and home dwelling, Parkinson's disease, prostate CA in remission, COPD and TIA brought due to confusion, speech disturbance. Brought to [facility], and transferred to [facility] due to stroke.  Now comfort care  Assessment & Plan:   Acute right MCA stroke Sepsis due to E. coli bacteremia Acute metabolic encephalopathy Elevated LFTs Parkinson's disease Depression COPD Hypokalemia Hyponatremia AKI Thrombocytopenia  Acute right MCA stroke: -MRI showed acute large infarct involving approximately 7 cm right MCA/PCA territory.  Patient initially failed SLP evaluation and oral intake persistently very poor and not enough to keep up with hydration and nutrition needs. -On 1/5: There was new hypotension-that was resolved with IV fluids and midodrine.  There was also worsening hemiparesis.  CT head showed interval no expansion of the stroke.  Due to progressive weakness and poor oral intake-on 1/12 there was a palliative meeting with the family who elected for complete comfort care and residential hospice.  Full comfort measures-awaits hospice bed in [facility].  Continue Sinemet, primidone Continue Ativan, hydromorphone as needed for anxiety and pain respectively  DVT prophylaxis: None Code Status: DNR Family Communication:  None present at bedside.  Plan of care discussed with patient in length and he verbalized understanding and agreed with it. Disposition Plan: Hospice in [facility] as soon as bed is available.  Consultants:   Neurology  Palliative care  Procedures:  MRI brain CT head Antimicrobials:   As per chart  Status is: Inpatient   Dispo: The patient is from: Home              Anticipated d/c is to: Hospice              Anticipated d/c date is: 2 days              Patient  currently is medically stable to d/c.   Subjective: Patient seen and examined.  He is alert, following commands, his speech is not clear.  He appears weak, cachectic and lethargic.  No acute events overnight.  Remained afebrile.    Objective: Vitals:   08/21/20 1536 08/21/20 2042 08/21/20 2304 08/22/20 0813  BP: 127/66 (!) 118/50 136/73 (!) 148/76  Pulse: (!) 120 (!) 123 (!) 127 61  Resp: 20 18 19 16   Temp: 97.6 F (36.4 C) 98.1 F (36.7 C) 98.4 F (36.9 C) 98.4 F (36.9 C)  TempSrc: Oral Oral Axillary Oral  SpO2: 95% 97% 94% 97%  Weight:      Height:       No intake or output data in the 24 hours ending 08/22/20 1013 Filed Weights   07/25/20 1925  Weight: 72.6 kg    Examination:  General exam: Appears calm and comfortable, on room air, alert, following commands, speech is not clear.  Cachectic, appears weak and lethargic Respiratory system: Clear to auscultation. Respiratory effort normal. Cardiovascular system: S1 & S2 heard, RRR. No JVD, murmurs, rubs, gallops or clicks. No pedal edema. Gastrointestinal system: Abdomen is nondistended, soft and nontender. No organomegaly or masses felt. Normal bowel sounds heard. Central nervous system: Alert and awake and following commands.   Data Reviewed: I have personally reviewed following labs and imaging studies  CBC: No results for input(s): WBC, NEUTROABS, HGB, HCT, MCV, PLT in the last 168 hours. Basic Metabolic Panel: No results for input(s): NA,  K, CL, CO2, GLUCOSE, BUN, CREATININE, CALCIUM, MG, PHOS in the last 168 hours. GFR: Estimated Creatinine Clearance: 63 mL/min (by C-G formula based on SCr of 0.65 mg/dL). Liver Function Tests: No results for input(s): AST, ALT, ALKPHOS, BILITOT, PROT, ALBUMIN in the last 168 hours. No results for input(s): LIPASE, AMYLASE in the last 168 hours. No results for input(s): AMMONIA in the last 168 hours. Coagulation Profile: No results for input(s): INR, PROTIME in the last 168  hours. Cardiac Enzymes: No results for input(s): CKTOTAL, CKMB, CKMBINDEX, TROPONINI in the last 168 hours. BNP (last 3 results) No results for input(s): PROBNP in the last 8760 hours. HbA1C: No results for input(s): HGBA1C in the last 72 hours. CBG: No results for input(s): GLUCAP in the last 168 hours. Lipid Profile: No results for input(s): CHOL, HDL, LDLCALC, TRIG, CHOLHDL, LDLDIRECT in the last 72 hours. Thyroid Function Tests: No results for input(s): TSH, T4TOTAL, FREET4, T3FREE, THYROIDAB in the last 72 hours. Anemia Panel: No results for input(s): VITAMINB12, FOLATE, FERRITIN, TIBC, IRON, RETICCTPCT in the last 72 hours. Sepsis Labs: No results for input(s): PROCALCITON, LATICACIDVEN in the last 168 hours.  No results found for this or any previous visit (from the past 240 hour(s)).    Radiology Studies: No results found.  Scheduled Meds: . carbidopa-levodopa  3 tablet Oral BID  . feeding supplement  237 mL Oral TID BM  . midodrine  2.5 mg Oral TID WC  . pantoprazole sodium  40 mg Oral Daily  . polyethylene glycol  17 g Oral Daily  . potassium chloride  40 mEq Oral Once  . primidone  150 mg Oral BID   Continuous Infusions:   LOS: 26 days   Time spent: 35 minutes   Heloise Gordan Loann Quill, MD [facility]  If 7PM-7AM, please contact night-coverage [email or web address] 08/22/2020, 10:13 AM

## 2020-08-23 DIAGNOSIS — I639 Cerebral infarction, unspecified: Secondary | ICD-10-CM | POA: Diagnosis not present

## 2020-08-23 NOTE — Progress Notes (Signed)
PROGRESS NOTE    Jeffrey Blair  EXB:[id number] DOB: 12-03-1927 DOA: 07/25/2020 PCP: [facility]   Brief Narrative:  Jeffrey Blair is a 87 y.o. M with dementia, ambulatory and home dwelling, Parkinson's disease, prostate CA in remission, COPD and TIA brought due to confusion, speech disturbance. Brought to [facility], and transferred to [facility] due to stroke.  Now comfort care  Assessment & Plan:   Acute right MCA stroke Sepsis due to E. coli bacteremia Acute metabolic encephalopathy Elevated LFTs Parkinson's disease Depression COPD Hypokalemia Hyponatremia AKI Thrombocytopenia  Acute right MCA stroke: -MRI showed acute large infarct involving approximately 7 cm right MCA/PCA territory.  Patient initially failed SLP evaluation and oral intake persistently very poor and not enough to keep up with hydration and nutrition needs. -On 1/5: There was new hypotension-that was resolved with IV fluids and midodrine.  There was also worsening hemiparesis.  CT head showed interval no expansion of the stroke.  Due to progressive weakness and poor oral intake-on 1/12 there was a palliative meeting with the family who elected for complete comfort care and residential hospice.  Full comfort measures-awaits hospice bed in [facility].  Continue Sinemet, primidone Continue Ativan as needed for anxiety.  Increase dose of Dilaudid to 1 mg every 2 hours as needed for pain.  DVT prophylaxis: None Code Status: DNR Family Communication:  None present at bedside.  Plan of care discussed with patient in length and he verbalized understanding and agreed with it. Disposition Plan: Hospice in [facility] as soon as bed is available.  Consultants:   Neurology  Palliative care  Procedures:  MRI brain CT head Antimicrobials:   As per chart  Status is: Inpatient   Dispo: The patient is from: Home              Anticipated d/c is to: Hospice              Anticipated d/c date  is: 2 days              Patient currently is medically stable to d/c.   Subjective: Patient seen and examined.  More comfortable this morning.  Alert and awake.  His Dilaudid dose was increased yesterday and since then he has improvement in his pain.  Remained afebrile.  No acute events overnight.  Objective: Vitals:   08/22/20 1956 08/23/20 0039 08/23/20 0421 08/23/20 0818  BP: (!) 111/53 (!) 123/58 134/66 129/71  Pulse: (!) 120 (!) 118 68 (!) 119  Resp: 18 20 16 18   Temp: 99.1 F (37.3 C) 98.4 F (36.9 C) 98.4 F (36.9 C) 98.4 F (36.9 C)  TempSrc: Axillary Oral Oral Oral  SpO2: 93% 94% 94% 96%  Weight:      Height:        Intake/Output Summary (Last 24 hours) at 08/23/2020 1034 Last data filed at 08/23/2020 0500 Gross per 24 hour  Intake 50 ml  Output 600 ml  Net -550 ml   Filed Weights   07/25/20 1925  Weight: 72.6 kg    Examination:  General exam: Appears calm and comfortable, on room air, alert, following commands, speech is not clear.  Cachectic, appears weak and lethargic Respiratory system: Clear to auscultation. Respiratory effort normal. Cardiovascular system: S1 & S2 heard, RRR. No JVD, murmurs, rubs, gallops or clicks. No pedal edema. Gastrointestinal system: Abdomen is nondistended, soft and nontender. No organomegaly or masses felt. Normal bowel sounds heard. Central nervous system: Alert and awake.   Data Reviewed:  I have personally reviewed following labs and imaging studies  CBC: No results for input(s): WBC, NEUTROABS, HGB, HCT, MCV, PLT in the last 168 hours. Basic Metabolic Panel: No results for input(s): NA, K, CL, CO2, GLUCOSE, BUN, CREATININE, CALCIUM, MG, PHOS in the last 168 hours. GFR: Estimated Creatinine Clearance: 63 mL/min (by C-G formula based on SCr of 0.65 mg/dL). Liver Function Tests: No results for input(s): AST, ALT, ALKPHOS, BILITOT, PROT, ALBUMIN in the last 168 hours. No results for input(s): LIPASE, AMYLASE in the last 168  hours. No results for input(s): AMMONIA in the last 168 hours. Coagulation Profile: No results for input(s): INR, PROTIME in the last 168 hours. Cardiac Enzymes: No results for input(s): CKTOTAL, CKMB, CKMBINDEX, TROPONINI in the last 168 hours. BNP (last 3 results) No results for input(s): PROBNP in the last 8760 hours. HbA1C: No results for input(s): HGBA1C in the last 72 hours. CBG: No results for input(s): GLUCAP in the last 168 hours. Lipid Profile: No results for input(s): CHOL, HDL, LDLCALC, TRIG, CHOLHDL, LDLDIRECT in the last 72 hours. Thyroid Function Tests: No results for input(s): TSH, T4TOTAL, FREET4, T3FREE, THYROIDAB in the last 72 hours. Anemia Panel: No results for input(s): VITAMINB12, FOLATE, FERRITIN, TIBC, IRON, RETICCTPCT in the last 72 hours. Sepsis Labs: No results for input(s): PROCALCITON, LATICACIDVEN in the last 168 hours.  No results found for this or any previous visit (from the past 240 hour(s)).    Radiology Studies: No results found.  Scheduled Meds: . carbidopa-levodopa  3 tablet Oral BID  . feeding supplement  237 mL Oral TID BM  . midodrine  2.5 mg Oral TID WC  . pantoprazole sodium  40 mg Oral Daily  . polyethylene glycol  17 g Oral Daily  . potassium chloride  40 mEq Oral Once  . primidone  150 mg Oral BID   Continuous Infusions:   LOS: 27 days   Time spent: 35 minutes   Jazlen Ogarro Loann Quill, MD [facility]  If 7PM-7AM, please contact night-coverage [email or web address] 08/23/2020, 10:34 AM

## 2020-08-24 DIAGNOSIS — I639 Cerebral infarction, unspecified: Secondary | ICD-10-CM | POA: Diagnosis not present

## 2020-08-24 NOTE — Progress Notes (Signed)
PROGRESS NOTE    MAGIC MOHLER  OXB:[id number] DOB: May 29, 1928 DOA: 07/25/2020 PCP: [facility]   Brief Narrative:  [name] is a 87 y.o. M with dementia, ambulatory and home dwelling, Parkinson's disease, prostate CA in remission, COPD and TIA brought due to confusion, speech disturbance. Brought to [facility], and transferred to [facility] due to stroke.  Now comfort care  Assessment & Plan:   Acute right MCA stroke Sepsis due to E. coli bacteremia Acute metabolic encephalopathy Elevated LFTs Parkinson's disease Depression COPD Hypokalemia Hyponatremia AKI Thrombocytopenia  Acute right MCA stroke: -MRI showed acute large infarct involving approximately 7 cm right MCA/PCA territory.  Patient initially failed SLP evaluation and oral intake persistently very poor and not enough to keep up with hydration and nutrition needs. -On 1/5: There was new hypotension-that was resolved with IV fluids and midodrine.  There was also worsening hemiparesis.  CT head showed interval no expansion of the stroke.  Due to progressive weakness and poor oral intake-on 1/12 there was a palliative meeting with the family who elected for complete comfort care and residential hospice.  Full comfort measures-awaits hospice bed in [facility].  Continue Sinemet, primidone Continue Ativan as needed for anxiety, Dilaudid 1 mg every 2 hours as needed for pain.  DVT prophylaxis: None Code Status: DNR Family Communication:  None present at bedside.  Plan of care discussed with patient in length and he verbalized understanding and agreed with it. Disposition Plan: Hospice in [facility] as soon as bed is available.  Consultants:   Neurology  Palliative care  Procedures:  MRI brain CT head Antimicrobials:   As per chart  Status is: Inpatient   Dispo: The patient is from: Home              Anticipated d/c is to: Hospice              Anticipated d/c date is: 2 days               Patient currently is medically stable to d/c.   Subjective: Patient seen and examined.  Appears more comfortable this morning.  No acute events overnight.  Objective: Vitals:   08/23/20 0818 08/23/20 1538 08/24/20 0406 08/24/20 0757  BP: 129/71 (!) 141/65 114/67 125/66  Pulse: (!) 119 (!) 129 (!) 101 (!) 123  Resp: 18 20 18 19   Temp: 98.4 F (36.9 C) (!) 100.5 F (38.1 C) 98.2 F (36.8 C) 99.6 F (37.6 C)  TempSrc: Oral Axillary Oral Axillary  SpO2: 96% 99% 95% 94%  Weight:      Height:        Intake/Output Summary (Last 24 hours) at 08/24/2020 1137 Last data filed at 08/24/2020 0402 Gross per 24 hour  Intake 140 ml  Output 450 ml  Net -310 ml   Filed Weights   07/25/20 1925  Weight: 72.6 kg    Examination:  General exam: Appears calm and comfortable, on room air, alert, speech is not clear.  Cachectic, appears weak and lethargic Respiratory system: Clear to auscultation. Respiratory effort normal. Cardiovascular system: S1 & S2 heard, RRR. No JVD, murmurs, rubs, gallops or clicks. No pedal edema. Gastrointestinal system: Abdomen is nondistended, soft and nontender. No organomegaly or masses felt. Normal bowel sounds heard. Central nervous system: Alert and awake.   Data Reviewed: I have personally reviewed following labs and imaging studies  CBC: No results for input(s): WBC, NEUTROABS, HGB, HCT, MCV, PLT in the last 168 hours. Basic Metabolic  Panel: No results for input(s): NA, K, CL, CO2, GLUCOSE, BUN, CREATININE, CALCIUM, MG, PHOS in the last 168 hours. GFR: Estimated Creatinine Clearance: 63 mL/min (by C-G formula based on SCr of 0.65 mg/dL). Liver Function Tests: No results for input(s): AST, ALT, ALKPHOS, BILITOT, PROT, ALBUMIN in the last 168 hours. No results for input(s): LIPASE, AMYLASE in the last 168 hours. No results for input(s): AMMONIA in the last 168 hours. Coagulation Profile: No results for input(s): INR, PROTIME in the last 168  hours. Cardiac Enzymes: No results for input(s): CKTOTAL, CKMB, CKMBINDEX, TROPONINI in the last 168 hours. BNP (last 3 results) No results for input(s): PROBNP in the last 8760 hours. HbA1C: No results for input(s): HGBA1C in the last 72 hours. CBG: No results for input(s): GLUCAP in the last 168 hours. Lipid Profile: No results for input(s): CHOL, HDL, LDLCALC, TRIG, CHOLHDL, LDLDIRECT in the last 72 hours. Thyroid Function Tests: No results for input(s): TSH, T4TOTAL, FREET4, T3FREE, THYROIDAB in the last 72 hours. Anemia Panel: No results for input(s): VITAMINB12, FOLATE, FERRITIN, TIBC, IRON, RETICCTPCT in the last 72 hours. Sepsis Labs: No results for input(s): PROCALCITON, LATICACIDVEN in the last 168 hours.  No results found for this or any previous visit (from the past 240 hour(s)).    Radiology Studies: No results found.  Scheduled Meds: . carbidopa-levodopa  3 tablet Oral BID  . feeding supplement  237 mL Oral TID BM  . midodrine  2.5 mg Oral TID WC  . pantoprazole sodium  40 mg Oral Daily  . polyethylene glycol  17 g Oral Daily  . potassium chloride  40 mEq Oral Once  . primidone  150 mg Oral BID   Continuous Infusions:   LOS: 28 days   Time spent: 35 minutes   Josua Ferrebee Loann Quill, MD [facility]  If 7PM-7AM, please contact night-coverage [email or web address] 08/24/2020, 11:37 AM

## 2020-08-24 NOTE — TOC Progression Note (Signed)
Transition of Care [facility]) - Progression Note    Patient Details  Name: Jeffrey Blair MRN: [id number] Date of Birth: 10-May-1928  Transition of Care [facility]) CM/SW Contact  Pollie Friar, RN Phone Number: 08/24/2020, 11:25 AM  Clinical Narrative:    No beds at [facility] today. Pt is next on the list per Cassandra with [facility]. TOC following.   Expected Discharge Plan: [place] Barriers to Discharge: Continued Medical Work up  Expected Discharge Plan and Services Expected Discharge Plan: [place] In-house Referral: Clinical Social Work   Post Acute Care Choice: [place] Living arrangements for the past 2 months: Single Family Home                                       Social Determinants of Health (SDOH) Interventions    Readmission Risk Interventions No flowsheet data found.

## 2020-08-25 NOTE — Progress Notes (Signed)
On assessment at 1915 patient breathing was apneic, and resting without symptoms of distress. Camera operator and prior day nurse notified of status. Charge advised RN to call family. Son was notified and arrived with two sisters. I answered questions of status and offered support. Will continue to monitor.

## 2020-08-25 NOTE — Care Management Important Message (Signed)
Important Message  Patient Details  Name: [name] MRN: [id number] Date of Birth: 1928/04/17   Medicare Important Message Given:  Yes     Shelda Altes 08/25/2020, 2:12 PM

## 2020-08-25 NOTE — TOC Progression Note (Signed)
Transition of Care [facility]) - Progression Note    Patient Details  Name: Jeffrey Blair MRN: [id number] Date of Birth: [birth year]  Transition of Care [facility]) CM/SW [place], [place] Phone Number: 08/25/2020, 10:27 AM  Clinical Narrative:   CSW contacted [place], and there is still no bed available for the patient today. There may be one available soon. CSW to continue to follow.    Expected Discharge Plan: [place] Barriers to Discharge: Continued Medical Work up  Expected Discharge Plan and Services Expected Discharge Plan: [place] In-house Referral: Clinical Social Work   Post Acute Care Choice: [place] Living arrangements for the past 2 months: Single Family Home                                       Social Determinants of Health (SDOH) Interventions    Readmission Risk Interventions No flowsheet data found.

## 2020-08-25 NOTE — Progress Notes (Signed)
PROGRESS NOTE    JAMESROBERT OHANESIAN  WGY:[id number] DOB: Jan 23, 1928 DOA: 07/25/2020 [facility]CP: [facility]   Brief Narrative:  Mr. Poke is a 87 y.o. M with dementia, ambulatory and home dwelling, Parkinson's disease, prostate CA in remission, COPD and TIA brought due to confusion, speech disturbance. Brought to [facility], and transferred to [facility] due to stroke.  Now comfort care  Assessment & Plan:   Acute right MCA stroke Sepsis due to E. coli bacteremia Acute metabolic encephalopathy Elevated LFTs Parkinson's disease Depression COPD Hypokalemia Hyponatremia AKI Thrombocytopenia  Acute right MCA stroke: -MRI showed acute large infarct involving approximately 7 cm right MCA/PCA territory.  Patient initially failed SLP evaluation and oral intake persistently very poor and not enough to keep up with hydration and nutrition needs. -On 1/5: There was new hypotension-that was resolved with IV fluids and midodrine.  There was also worsening hemiparesis.  CT head showed interval no expansion of the stroke.  Due to progressive weakness and poor oral intake-on 1/12 there was a palliative meeting with the family who elected for complete comfort care and residential hospice.  Full comfort measures-awaits hospice bed in [facility].  Continue Sinemet, primidone Continue Ativan as needed for anxiety, Dilaudid 1 mg every 2 hours as needed for pain.  DVT prophylaxis: None Code Status: DNR Family Communication:  None present at bedside.  Plan of care discussed with patient in length and he verbalized understanding and agreed with it. Disposition Plan: Hospice in [facility] as soon as bed is available.  Consultants:   Neurology  Palliative care  Procedures:  MRI brain CT head Antimicrobials:   As per chart  Status is: Inpatient   Dispo: The patient is from: Home              Anticipated d/c is to: Hospice              Anticipated d/c date is: >3 days               Patient currently is medically stable to d/c.   Subjective: Patient seen and examined.  Appears comfortable.  No acute events overnight.  Objective: Vitals:   08/24/20 0406 08/24/20 0757 08/24/20 2104 08/25/20 0858  BP: 114/67 125/66 (!) 148/67 121/61  Pulse: (!) 101 (!) 123 (!) 144 (!) 128  Resp: 18 19  20   Temp: 98.2 F (36.8 C) 99.6 F (37.6 C) 99.5 F (37.5 C) (!) 100.8 F (38.2 C)  TempSrc: Oral Axillary Oral Axillary  SpO2: 95% 94% 95% 94%  Weight:      Height:        Intake/Output Summary (Last 24 hours) at 08/25/2020 1113 Last data filed at 08/24/2020 2100 Gross per 24 hour  Intake -  Output 300 ml  Net -300 ml   Filed Weights   07/25/20 1925  Weight: 72.6 kg    Examination:  General exam: Appears calm and comfortable, on room air, alert, speech is not clear.  Cachectic, appears weak and lethargic Respiratory system: Clear to auscultation. Respiratory effort normal. Cardiovascular system: S1 & S2 heard, RRR. No JVD, murmurs, rubs, gallops or clicks. No pedal edema. Gastrointestinal system: Abdomen is nondistended, soft and nontender. No organomegaly or masses felt. Normal bowel sounds heard. Central nervous system: Alert and awake.   Data Reviewed: I have personally reviewed following labs and imaging studies  CBC: No results for input(s): WBC, NEUTROABS, HGB, HCT, MCV, PLT in the last 168 hours. Basic Metabolic Panel: No results for  input(s): NA, K, CL, CO2, GLUCOSE, BUN, CREATININE, CALCIUM, MG, PHOS in the last 168 hours. GFR: Estimated Creatinine Clearance: 63 mL/min (by C-G formula based on SCr of 0.65 mg/dL). Liver Function Tests: No results for input(s): AST, ALT, ALKPHOS, BILITOT, PROT, ALBUMIN in the last 168 hours. No results for input(s): LIPASE, AMYLASE in the last 168 hours. No results for input(s): AMMONIA in the last 168 hours. Coagulation Profile: No results for input(s): INR, PROTIME in the last 168 hours. Cardiac Enzymes: No  results for input(s): CKTOTAL, CKMB, CKMBINDEX, TROPONINI in the last 168 hours. BNP (last 3 results) No results for input(s): PROBNP in the last 8760 hours. HbA1C: No results for input(s): HGBA1C in the last 72 hours. CBG: No results for input(s): GLUCAP in the last 168 hours. Lipid Profile: No results for input(s): CHOL, HDL, LDLCALC, TRIG, CHOLHDL, LDLDIRECT in the last 72 hours. Thyroid Function Tests: No results for input(s): TSH, T4TOTAL, FREET4, T3FREE, THYROIDAB in the last 72 hours. Anemia Panel: No results for input(s): VITAMINB12, FOLATE, FERRITIN, TIBC, IRON, RETICCTPCT in the last 72 hours. Sepsis Labs: No results for input(s): PROCALCITON, LATICACIDVEN in the last 168 hours.  No results found for this or any previous visit (from the past 240 hour(s)).    Radiology Studies: No results found.  Scheduled Meds: . carbidopa-levodopa  3 tablet Oral BID  . feeding supplement  237 mL Oral TID BM  . midodrine  2.5 mg Oral TID WC  . pantoprazole sodium  40 mg Oral Daily  . polyethylene glycol  17 g Oral Daily  . potassium chloride  40 mEq Oral Once  . primidone  150 mg Oral BID   Continuous Infusions:   LOS: 29 days   Time spent: 35 minutes   Jacobe Study Loann Quill, MD [facility]  If 7PM-7AM, please contact night-coverage [email or web address] 08/25/2020, 11:13 AM

## 2020-08-26 DIAGNOSIS — E43 Unspecified severe protein-calorie malnutrition: Secondary | ICD-10-CM

## 2020-08-26 LAB — SARS CORONAVIRUS 2 BY RT PCR (HOSPITAL ORDER, PERFORMED IN ~~LOC~~ HOSPITAL LAB): SARS Coronavirus 2: NEGATIVE

## 2020-08-26 MED ORDER — ENSURE ENLIVE PO LIQD: 237.0000 mL | Freq: Three times a day (TID) | ORAL | 12 refills | Status: AC

## 2020-08-26 MED ORDER — POLYETHYLENE GLYCOL 3350 17 G PO PACK: 17.0000 g | PACK | Freq: Every day | ORAL | 0 refills | Status: AC

## 2020-08-26 MED ORDER — ACETAMINOPHEN 325 MG PO TABS: 650.0000 mg | ORAL_TABLET | Freq: Four times a day (QID) | ORAL | Status: AC | PRN

## 2020-08-26 MED ORDER — ONDANSETRON HCL 4 MG PO TABS: 4.0000 mg | ORAL_TABLET | Freq: Four times a day (QID) | ORAL | 0 refills | Status: AC | PRN

## 2020-08-26 MED ORDER — LORAZEPAM 1 MG PO TABS
1.0000 mg | ORAL_TABLET | ORAL | 0 refills | Status: AC | PRN
Start: 2020-08-26 — End: ?

## 2020-08-26 MED ORDER — MIDODRINE HCL 2.5 MG PO TABS: 2.5000 mg | ORAL_TABLET | Freq: Three times a day (TID) | ORAL | Status: AC

## 2020-08-26 MED ORDER — POLYVINYL ALCOHOL 1.4 % OP SOLN
1.0000 [drp] | Freq: Four times a day (QID) | OPHTHALMIC | 0 refills | Status: AC | PRN
Start: 2020-08-26 — End: ?

## 2020-08-26 NOTE — TOC Transition Note (Cosign Needed)
Transition of Care [facility]) - CM/SW Discharge Note   Patient Details  Name: Jeffrey Blair MRN: [id number] Date of Birth: 1928/03/24  Transition of Care [facility]) CM/SW Contact:  Marney Setting, [place] Work Phone Number: 08/26/2020, 11:49 AM   Clinical Narrative:   Nurse to call report to [phone]    Final next level of care: Hospice Medical Facility Barriers to Discharge: No Barriers Identified   Patient Goals and CMS Choice   CMS Medicare.gov Compare Post Acute Care list provided to:: Patient Represenative (must comment) Choice offered to / list presented to : Adult Children  Discharge Placement              Patient [facility] bed at:  [facility]) Patient to be transferred to facility by: [facility] Name of family member notified: [name] Patient and family notified of of transfer: 08/26/20  Discharge Plan and Services In-house Referral: Clinical Social Work   Post Acute Care Choice: [place]                               Social Determinants of Health (SDOH) Interventions     Readmission Risk Interventions No flowsheet data found.

## 2020-08-26 NOTE — Discharge Summary (Signed)
Physician Discharge Summary  Jeffrey Blair [id number] DOB: 01/14/1928 DOA: 07/25/2020  PCP: [facility]  Admit date: 07/25/2020 Discharge date: 08/26/2020  Admitted From: Home Disposition: Residential Hospice  Recommendations for Outpatient Follow-up:  1. Follow up care per Hospice Protocol   Home Health: No Equipment/Devices: None  Discharge Condition: Guarded/Poor CODE STATUS: DO NOT RESUSCITATE Diet recommendation: Dysphagia 1 Diet   Brief/Interim Summary: The patient is a 87 year old African-American male with a past medical history significant for but not limited to dementia, ambulatory dysfunction and home dwelling, Parkinson's disease, history of prostate cancer remission, COPD, history of TIA who was brought to the emergency room secondary to confusion and speech disturbance.  He was initially brought to [facility] and then transferred to [facility] due to acute CVA.  He initially failed his SLP evaluation and his oral intake is been persistently very poor and not have to keep with hydration or nutrition needs.  MRI showed a an acute large infarct involving the approximate centimeter to the right MCA/PCA territory.  His hospitalization has been complicated by sepsis secondary to E. coli bacteremia that was present on admission as well as acute metabolic encephalopathy, abnormal LFTs, and electrolyte abnormalities with hypokalemia and hyponatremia.  On 08/05/2020 there is new hypotension that resolved with IV fluid hydration but also noted to have worsening hemiparesis.  Due to progressive weakness and poor oral intake and after significant family discussion with palliative care family opted for comfort care and residential hospice treatment.  Unfortunately bed has not been available until today.  He is currently going to be discharged to residential hospice if bed is available now.  Discharge Diagnoses:  Principal Problem:   Acute ischemic stroke  [facility]) Active Problems:   COPD (chronic obstructive pulmonary disease) (HCC)   Parkinson disease (HCC)   Hyperlipidemia   Hypertension   Normocytic anemia   Acute CVA (cerebrovascular accident) ([place])   Protein calorie malnutrition ([place])   Underweight due to inadequate caloric intake   Protein-calorie malnutrition, severe  Acute right MCA stroke Sepsis due to E. coli bacteremia Acute metabolic encephalopathy Severe Protein Calorie Malnutrition Elevated LFTs Parkinson's disease Depression COPD Hypokalemia Hyponatremia AKI Thrombocytopenia  Acute right MCA stroke: -MRI showed acute large infarct involving approximately 7 cm right MCA/PCA territory.  Patient initially failed SLP evaluation and oral intake persistently very poor and not enough to keep up with hydration and nutrition needs. -On 1/5: There was new hypotension-that was resolved with IV fluids and midodrine.  There was also worsening hemiparesis.  CT head showed interval no expansion of the stroke.  Due to progressive weakness and poor oral intake-on 1/12 there was a palliative meeting with the family who elected for complete comfort care and residential hospice.  Full comfort measures-awaits hosp[facility] bed in [facility].  Continue Sinemet, primidone Continue Ativan as needed for anxiety, Dilaudid 1 mg every 2 hours as needed for pain.  Discharge Instructions  Discharge Instructions    Call MD for:  difficulty breathing, headache or visual disturbances   Complete by: As directed    Call MD for:  extreme fatigue   Complete by: As directed    Call MD for:  hives   Complete by: As directed    Call MD for:  persistant dizziness or light-headedness   Complete by: As directed    Call MD for:  persistant nausea and vomiting   Complete by: As directed    Call MD for:  redness, tenderness, or signs of  infection (pain, swelling, redness, odor or green/yellow discharge around incision site)   Complete by: As  directed    Call MD for:  severe uncontrolled pain   Complete by: As directed    Call MD for:  temperature >100.4   Complete by: As directed    Diet - low sodium heart healthy   Complete by: As directed    Dysphagia 1 Diet   Discharge instructions   Complete by: As directed    Follow up Care per Hospice Protocol   Increase activity slowly   Complete by: As directed    No wound care   Complete by: As directed      Allergies as of 08/26/2020      Reactions   Morphine Anaphylaxis, Shortness Of Breath, Other (See Comments)   Altered mental status; takes tramadol at home   Penicillins Other (See Comments)   Has patient had a PCN reaction causing immediate rash, facial/tongue/throat swelling, SOB or lightheadedness with hypotension: unknown Has patient had a PCN reaction causing severe rash involving mucus membranes or skin necrosis: unknown Has patient had a PCN reaction that required hospitalization; unknown Has patient had a PCN reaction occurring within the last 10 years:unknown If all of the above answers are "NO", then may proceed with Cephalosporin use. Unknown reaction      Medication List    STOP taking these medications   aspirin 81 MG chewable tablet   Calcium Carbonate-Vitamin D 600-400 MG-UNIT tablet   carboxymethylcellulose 0.5 % Soln Commonly known as: REFRESH PLUS   diltiazem 120 MG 24 hr capsule Commonly known as: DILACOR XR   loratadine 10 MG tablet Commonly known as: CLARITIN   meclizine 25 MG tablet Commonly known as: ANTIVERT   omeprazole 20 MG capsule Commonly known as: PRILOSEC   propranolol 20 MG tablet Commonly known as: INDERAL   simvastatin 20 MG tablet Commonly known as: Set designer Respimat 2.5-2.5 MCG/ACT Aers Generic drug: Tiotropium Bromide-Olodaterol   tiotropium 18 MCG inhalation capsule Commonly known as: SPIRIVA   traMADol 50 MG tablet Commonly known as: ULTRAM   venlafaxine 37.5 MG tablet Commonly known as:  EFFEXOR   vitamin B-12 500 MCG tablet Commonly known as: CYANOCOBALAMIN     TAKE these medications   acetaminophen 325 MG tablet Commonly known as: TYLENOL Take 2 tablets (650 mg total) by mouth every 6 (six) hours as needed for mild pain (or Fever >/= 101). What changed:   medication strength  how much to take  reasons to take this   albuterol 108 (90 Base) MCG/ACT inhaler Commonly known as: VENTOLIN HFA Inhale 2 puffs into the lungs every 6 (six) hours as needed for wheezing or shortness of breath. What changed: Another medication with the same name was removed. Continue taking this medication, and follow the directions you see here.   carbidopa-levodopa 25-100 MG tablet Commonly known as: SINEMET IR Take 3 tablets by mouth in the morning and at bedtime.   feeding supplement Liqd Take 237 mLs by mouth 3 (three) times daily between meals.   LORazepam 1 MG tablet Commonly known as: ATIVAN Take 1 tablet (1 mg total) by mouth every 4 (four) hours as needed for anxiety.   midodrine 2.5 MG tablet Commonly known as: PROAMATINE Take 1 tablet (2.5 mg total) by mouth 3 (three) times daily with meals.   ondansetron 4 MG tablet Commonly known as: ZOFRAN Take 1 tablet (4 mg total) by mouth every 6 (six) hours as needed  for nausea.   polyethylene glycol 17 g packet Commonly known as: MIRALAX / GLYCOLAX Take 17 g by mouth daily.   polyvinyl alcohol 1.4 % ophthalmic solution Commonly known as: LIQUIFILM TEARS Place 1 drop into both eyes 4 (four) times daily as needed for dry eyes.   primidone 50 MG tablet Commonly known as: MYSOLINE Take 150 mg by mouth 2 (two) times daily.       Follow-up Information    [facility]. Schedule an appointment as soon as possible for a visit in 4 week(s).   Specialty: Neurology Contact information: [place] [phone]             Allergies  Allergen Reactions   . Morphine Anaphylaxis, Shortness Of Breath and Other (See Comments)    Altered mental status; takes tramadol at home  . Penicillins Other (See Comments)    Has patient had a PCN reaction causing immediate rash, facial/tongue/throat swelling, SOB or lightheadedness with hypotension: unknown Has patient had a PCN reaction causing severe rash involving mucus membranes or skin necrosis: unknown Has patient had a PCN reaction that required hospitalization; unknown Has patient had a PCN reaction occurring within the last 10 years:unknown If all of the above answers are "NO", then may proceed with Cephalosporin use.  Unknown reaction    Consultations:  Neurology  Procedures/Studies: CT ANGIO HEAD W OR WO CONTRAST  Addendum Date: 07/31/2020   ADDENDUM REPORT: 07/31/2020 11:23 ADDENDUM: Study discussed by telephone with Dr. Rosalin Hawking on 07/31/2020 at 1112 hours. Electronically Signed   By: Genevie Ann M.D.   On: 07/31/2020 11:23   Result Date: 07/31/2020 CLINICAL DATA:  87 year old male with recent posterior right MCA and right MCA/PCA watershed infarct on brain MRI 07/27/2020. CTA neck yesterday with bilateral vertebral artery severe stenoses, occlusion. Negative cervical carotid arteries. EXAM: CT ANGIOGRAPHY HEAD TECHNIQUE: Multidetector CT imaging of the head was performed using the standard protocol during bolus administration of intravenous contrast. Multiplanar CT image reconstructions and MIPs were obtained to evaluate the vascular anatomy. CONTRAST:  33mL OMNIPAQUE IOHEXOL 350 MG/ML SOLN COMPARISON:  Brain MRI 07/27/2020. CTA neck yesterday. Plain head CT 01/24/2016. FINDINGS: CT HEAD Brain: Confluent cytotoxic edema corresponding to the abnormal DWI in the posterior right MCA and right MCA/PCA watershed area (series 5, image 16). No significant mass effect. No definite associated acute hemorrhage. Background cerebral volume not significantly changed since 2017. No ventriculomegaly. No midline  shift. Patchy bilateral cerebral white matter hypodensity has progressed since 2017. Gray-white matter differentiation in the deep gray nuclei, brainstem and cerebellum appear stable. Calvarium and skull base: No acute osseous abnormality identified. Paranasal sinuses: Visualized paranasal sinuses and mastoids are stable and well pneumatized. Orbits: No acute orbit or scalp soft tissue finding. CTA HEAD Posterior circulation: Poor enhancement of the distal vertebral arteries, which appear functionally occluded beyond the PICA origins. Poor enhancement of the proximal basilar artery, which appears reconstituted from posterior communicating arteries at the basilar tip. Most of the basilar artery does remain patent (see sagittal series 14, image 21). Patent SCA origins. Moderate left PCA P1 and P2 stenoses. Severe stenosis right PCA P1 origin although better contribution from the right posterior communicating artery (series 12, image 24). Severe additional right P2 and PCA bifurcation stenoses (series 14, image 19). Bilateral distal PCA branches are patent but attenuated. Anterior circulation: Both ICA siphons are patent and somewhat dolichoectatic. Minimal siphon calcified plaque. No siphon stenosis. Normal ophthalmic and posterior  communicating artery origins. Patent carotid termini. Patent MCA and ACA origins. Mild bilateral A1 irregularity without stenosis. Partially fenestrated appearing anterior communicating artery (normal variant). Left ACA branches are within normal limits. There is mild to moderate stenosis of the distal right A2 (series 14, image 21). Left MCA M1 segment and bifurcation are patent without stenosis. No left MCA branch occlusion is identified. There is generalized mild branch irregularity. There is a left posterior M3 branch severe stenosis at its origin on series 14, image 30. Right MCA origin is mildly irregular and stenotic. Right MCA M1 remains patent. But at the right MCA bifurcation  there is an M2 branch occlusion (series 13, images 71 and 70). Remaining anterior division MCA branches appear mildly irregular. Venous sinuses: Early contrast timing, not well evaluated. Anatomic variants: None significant. Review of the MIP images confirms the above findings IMPRESSION: 1. Positive for recent Large Vessel Occlusion: Right MCA posterior M2 branch. 2. Superimposed severe widespread posterior circulation stenosis, including severe tandem stenoses of the Right PCA which in conjunction with #1 likely explain the vascular territory involvement of the recent acute infarct (right MCA/PCA). 3. Positive also for Occluded Distal Vertebral Arteries as demonstrated on the neck yesterday. There is suboptimal reconstitution of the Basilar Artery from bilateral posterior communicating arteries. 4. Other intracranial atherosclerosis: Severe stenosis Left MCA posterior M3 branch. Moderate stenosis Right ACA A2. Mild stenosis Right MCA origin. 5. Expected CT appearance of recent infarcts from #1 and #2. No malignant hemorrhagic transformation or mass effect. Electronically Signed: By: Genevie Ann M.D. On: 07/31/2020 10:59   CT HEAD WO CONTRAST  Result Date: 08/06/2020 CLINICAL DATA:  Follow-up examination for stroke. EXAM: CT HEAD WITHOUT CONTRAST TECHNIQUE: Contiguous axial images were obtained from the base of the skull through the vertex without intravenous contrast. COMPARISON:  Prior head CT from 08/03/2020. FINDINGS: Brain: Normal expected interval evolution of subacute right MCA distribution infarct, relatively stable in size and distribution from previous. Few serpiginous hyperdensities within the area of infarction could reflect laminar necrosis and/or petechial hemorrhage (series 6, image 19). No evidence for malignant hemorrhage or transformation or significant regional mass effect. There is question of new subtle hypodensity involving the posterior right insular cortex and overlying operculum, not  definitely seen on prior exam (series 3, images 16, 18). Additionally, question subtle hypodensity/fogging more superiorly at the posterior right frontoparietal region (series 3, image 24). These findings appear new and/or increased from previous CT and/or MRI, and are concerning for possible new acute component/expansion of the right MCA territory infarct. No associated hemorrhage. No other acute large vessel territory infarct. Underlying atrophy with chronic small vessel ischemic disease again noted. No mass lesion or midline shift. No hydrocephalus or extra-axial fluid collection. Vascular: No hyperdense vessel. Scattered vascular calcifications noted within the carotid siphons. Skull: Scalp soft tissues and calvarium demonstrate no acute finding. Sinuses/Orbits: Globes and orbital soft tissues remain within normal limits. Paranasal sinuses mastoid air cells are clear. Other: None. IMPRESSION: 1. Suspected new subtle hypodensities adjacent to the evolving subacute right MCA distribution infarct as above, concerning for new acute component/interval expansion. No associated hemorrhage or mass effect. 2. Otherwise normal expected interval evolution of the subacute right MCA distribution infarct. Few serpiginous hyperdensities within the area of infarction could reflect developing laminar necrosis and/or petechial hemorrhage. No evidence for malignant hemorrhagic transformation or significant regional mass effect. 3. No other new acute intracranial abnormality. Results were called by telephone at the time of interpretation on 08/06/2020 at 1:07  am to provider MCNEILL KIRKPATRICK. Electronically Signed   By: Rise Mu M.D.   On: 08/06/2020 01:18   CT HEAD WO CONTRAST  Result Date: 08/03/2020 CLINICAL DATA:  Chronic headache with new features or increased frequency EXAM: CT HEAD WITHOUT CONTRAST TECHNIQUE: Contiguous axial images were obtained from the base of the skull through the vertex without  intravenous contrast. COMPARISON:  CTA 07/31/2020 and previous FINDINGS: Brain: Interval evolution of subacute right MCA distribution infarct without hemorrhage. No mass effect. No midline shift. Stable mild atrophy. Ventricles and sulci elsewhere remain symmetric. Patchy hypoattenuation in deep and periventricular white matter bilaterally as before. Vascular: Atherosclerotic and physiologic intracranial calcifications. Skull: Normal. Negative for fracture or focal lesion. Sinuses/Orbits: No acute finding. Other: None. IMPRESSION: 1. Interval evolution of subacute right MCA distribution infarct without hemorrhage or mass effect. 2. Stable atrophy and nonspecific white matter changes. Electronically Signed   By: Corlis Leak M.D.   On: 08/03/2020 16:24   CT ANGIO NECK W OR WO CONTRAST  Result Date: 07/30/2020 CLINICAL DATA:  Acute neurologic deficit. EXAM: CT ANGIOGRAPHY NECK TECHNIQUE: Multidetector CT imaging of the neck was performed using the standard protocol during bolus administration of intravenous contrast. Multiplanar CT image reconstructions and MIPs were obtained to evaluate the vascular anatomy. Carotid stenosis measurements (when applicable) are obtained utilizing NASCET criteria, using the distal internal carotid diameter as the denominator. CONTRAST:  30mL OMNIPAQUE IOHEXOL 350 MG/ML SOLN COMPARISON:  None. FINDINGS: CTA NECK FINDINGS SKELETON: There is no bony spinal canal stenosis. No lytic or blastic lesion. OTHER NECK: Normal pharynx, larynx and major salivary glands. No cervical lymphadenopathy. Unremarkable thyroid gland. UPPER CHEST: No pneumothorax or pleural effusion. No nodules or masses. AORTIC ARCH: There is no calcific atherosclerosis of the aortic arch. There is no aneurysm, dissection or hemodynamically significant stenosis of the visualized portion of the aorta. Conventional 3 vessel aortic branching pattern. The visualized proximal subclavian arteries are widely patent. RIGHT  CAROTID SYSTEM: Normal without aneurysm, dissection or stenosis. LEFT CAROTID SYSTEM: Normal without aneurysm, dissection or stenosis. VERTEBRAL ARTERIES: There is severe multifocal stenosis with multiple areas of occlusion both vertebral arteries involving all segments. IMPRESSION: 1. Severe multifocal stenosis of both vertebral arteries with multiple areas of occlusion, likely due to atherosclerotic disease. 2. No carotid stenosis. Electronically Signed   By: Deatra Robinson M.D.   On: 07/30/2020 23:10   US Abdomen Complete  Result Date: 08/11/2020 CLINICAL DATA:  Hyperbilirubinemia EXAM: ABDOMEN ULTRASOUND COMPLETE COMPARISON:  Renal ultrasound 08/07/2020 and CT abdomen from 05/27/2015 FINDINGS: Gallbladder: Cholelithiasis is present including a 0.8 cm gallstone. Sludge is present in the gallbladder. Mild gallbladder wall thickening at 0.4 cm. No pericholecystic fluid. Sonographic Murphy's sign absent. Common bile duct: Diameter: 0.7 cm Liver: No focal lesion identified. Within normal limits in parenchymal echogenicity. Portal vein is patent on color Doppler imaging with normal direction of blood flow towards the liver. IVC: No abnormality visualized. Pancreas: Not well seen due to overlying bowel gas Spleen: Size and appearance within normal limits. Right Kidney: Length: 10.4 cm. Echogenicity within normal limits. No solid mass or hydronephrosis visualized. At least 2 small right renal cysts are present, one approximately 0.6 cm in diameter and the other 0.8 cm in diameter. Left Kidney: Length: 11.2 cm. Echogenicity within normal limits. No solid mass or hydronephrosis visualized. A 2.0 by 1.7 by 2.0 cm left mid to lower renal cyst is present and has been shown on prior exams. Abdominal aorta: No aneurysm visualized.  Atherosclerosis  noted. Other findings: Suspected bilateral pleural effusions. IMPRESSION: 1. Cholelithiasis and gallbladder sludge. Mild gallbladder wall thickening at 0.4 cm. No pericholecystic  fluid or sonographic Murphy sign. Correlate clinically in assessing for acute cholecystitis. If assessment for patency of the cystic duct is warranted, nuclear medicine hepatobiliary scan may be helpful. 2. Common bile duct measures 0.7 cm in diameter, within normal limits for age. 3. Suspected bilateral pleural effusions. 4. Bilateral renal cysts. 5.  Aortic Atherosclerosis (ICD10-I70.0). 6. The pancreas is not well seen due to overlying bowel gas. Electronically Signed   By: Van Clines M.D.   On: 08/11/2020 13:47   US RENAL  Result Date: 08/07/2020 CLINICAL DATA:  Acute renal injury. EXAM: RENAL / URINARY TRACT ULTRASOUND COMPLETE COMPARISON:  CT scan 05/27/2015 FINDINGS: Right Kidney: Renal measurements: 9.4 x 4.1 x 5.8 cm = volume: 115.9 mL. Normal renal cortical thickness and echogenicity for age. No worrisome renal lesions or hydronephrosis. Left Kidney: Renal measurements: 9.1 x 4.7 x 4.9 cm = volume: 116.6 mL. Normal renal cortical thickness and echogenicity for age. Simple 2 cm cyst, unchanged since prior CT scan. No worrisome renal lesions or hydronephrosis. Bladder: Normal Other: None. IMPRESSION: Unremarkable renal ultrasound examination. Electronically Signed   By: Marijo Sanes M.D.   On: 08/07/2020 11:53   DG CHEST PORT 1 VIEW  Result Date: 08/05/2020 CLINICAL DATA:  87 year old male with fever. EXAM: PORTABLE CHEST 1 VIEW COMPARISON:  Chest radiograph dated 01/30/2016 FINDINGS: No focal consolidation, pleural effusion or pneumothorax. Mild chronic interstitial coarsening and bronchitic changes. The cardiac silhouette is within limits. No acute osseous pathology. Osteopenia IMPRESSION: No active disease. Electronically Signed   By: Anner Crete M.D.   On: 08/05/2020 18:27    Subjective: Seen and examined and was a little agitated. Confused and unable to provide a subjective History. No family at bedside. [facility]  Discharge Exam: Vitals:   08/26/20  0619 08/26/20 0902  BP: 118/60 124/78  Pulse: (!) 116 (!) 121  Resp: 14 16  Temp: 98 F (36.7 C) 98.1 F (36.7 C)  SpO2: 91%    Vitals:   08/25/20 1100 08/25/20 2038 08/26/20 0619 08/26/20 0902  BP: (!) 90/45 96/69 118/60 124/78  Pulse: (!) 112 (!) 106 (!) 116 (!) 121  Resp: 18 18 14 16   Temp: 99.1 F (37.3 C) (!) 97.5 F (36.4 C) 98 F (36.7 C) 98.1 F (36.7 C)  TempSrc: Oral Oral Oral Axillary  SpO2: 91% 98% 91%   Weight:      Height:       General: Pt is not alert but he is  awake, not in acute distress but seemed a little agitated Cardiovascular: RRR, S1/S2 +, no rubs, no gallops Respiratory: Diminished bilaterally, no wheezing, no rhonchi Abdominal: Soft, NT, ND, bowel sounds + Extremities: no edema, no cyanosis  The results of significant diagnostics from this hospitalization (including imaging, microbiology, ancillary and laboratory) are listed below for reference.    Microbiology: No results found for this or any previous visit (from the past 240 hour(s)).   Labs: BNP (last 3 results) No results for input(s): BNP in the last 8760 hours. Basic Metabolic Panel: No results for input(s): NA, K, CL, CO2, GLUCOSE, BUN, CREATININE, CALCIUM, MG, PHOS in the last 168 hours. Liver Function Tests: No results for input(s): AST, ALT, ALKPHOS, BILITOT, PROT, ALBUMIN in the last 168 hours. No results for input(s): LIPASE, AMYLASE in the last 168 hours. No results for input(s):  AMMONIA in the last 168 hours. CBC: No results for input(s): WBC, NEUTROABS, HGB, HCT, MCV, PLT in the last 168 hours. Cardiac Enzymes: No results for input(s): CKTOTAL, CKMB, CKMBINDEX, TROPONINI in the last 168 hours. BNP: Invalid input(s): POCBNP CBG: No results for input(s): GLUCAP in the last 168 hours. D-Dimer No results for input(s): DDIMER in the last 72 hours. Hgb A1c No results for input(s): HGBA1C in the last 72 hours. Lipid Profile No results for input(s): CHOL, HDL, LDLCALC, TRIG,  CHOLHDL, LDLDIRECT in the last 72 hours. Thyroid function studies No results for input(s): TSH, T4TOTAL, T3FREE, THYROIDAB in the last 72 hours.  Invalid input(s): FREET3 Anemia work up No results for input(s): VITAMINB12, FOLATE, FERRITIN, TIBC, IRON, RETICCTPCT in the last 72 hours. Urinalysis    Component Value Date/Time   COLORURINE AMBER (A) 08/06/2020 1041   APPEARANCEUR CLOUDY (A) 08/06/2020 1041   LABSPEC 1.020 08/06/2020 1041   PHURINE 5.0 08/06/2020 1041   GLUCOSEU NEGATIVE 08/06/2020 1041   GLUCOSEU NEGATIVE 05/21/2015 1632   HGBUR SMALL (A) 08/06/2020 1041   BILIRUBINUR SMALL (A) 08/06/2020 1041   KETONESUR 5 (A) 08/06/2020 1041   PROTEINUR 100 (A) 08/06/2020 1041   UROBILINOGEN 0.2 05/21/2015 1632   NITRITE NEGATIVE 08/06/2020 1041   LEUKOCYTESUR NEGATIVE 08/06/2020 1041   Sepsis Labs Invalid input(s): PROCALCITONIN,  WBC,  LACTICIDVEN Microbiology No results found for this or any previous visit (from the past 240 hour(s)).  Time coordinating discharge:  25 minutes  SIGNED:  Kerney Elbe, DO [facility] 08/26/2020, 11:28 AM Pager is on [place]  If 7PM-7AM, please contact night-coverage [email or web address]

## 2020-08-26 NOTE — Progress Notes (Signed)
Patient was rounded on at 0100, family at bedside patient  has become restless and family requested 0.5mg  Dilaudid for comfort of patient. Raymond RN witnessed waste of the remaining 0.5mg  Dilaudid. Will continue to monitor patient for safety and comfort.

## 2020-09-29 DEATH — deceased

## 2022-07-04 IMAGING — MR MR HEAD W/O CM
12 of 13 series · 44 of 48 positions shown · non-contrast
Comparison: Brain MRI and MRA 01/25/2016.

CLINICAL DATA: [AGE] male with slurred speech and neurologic
deficit since 07/25/2020. Left AMA from [HOSPITAL] [facility]
waiting for admission.

EXAM:
MRI HEAD WITHOUT CONTRAST
TECHNIQUE: Multiplanar, multiecho pulse sequences of the brain and surrounding
structures were obtained without intravenous contrast.

[Series 5: DWI · axial · 3.0mm · 0.88mm/px · z∈[-93,+50]mm · 8 of 100 slices shown (1 of 4)]
[im 1/100]
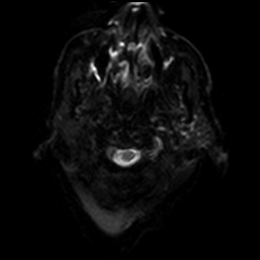
[im 15/100]
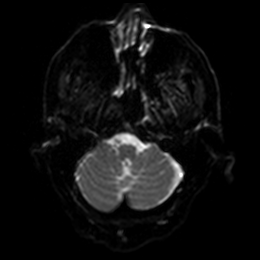
[im 29/100]
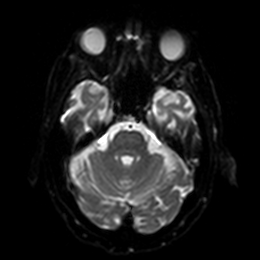
[im 43/100]
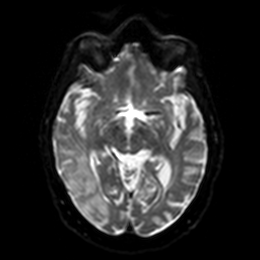
[im 57/100]
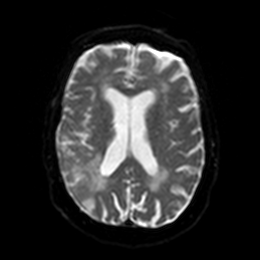
[im 71/100]
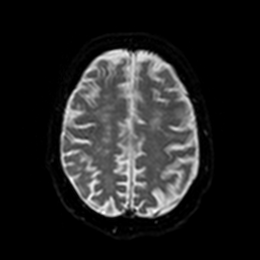
[im 85/100]
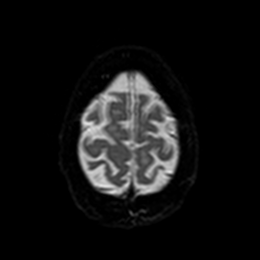
[im 100/100]
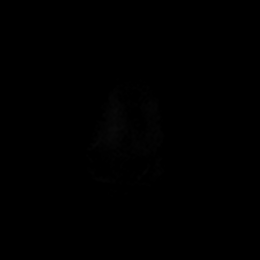

[Series 6: DWI · axial · 3.0mm · 0.88mm/px · z∈[-93,+50]mm · 4 of 50 slices shown (2 of 4)]
[im 1/50]
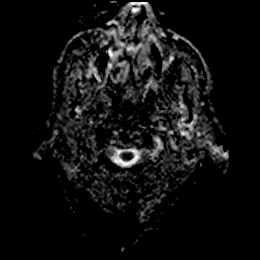
[im 17/50]
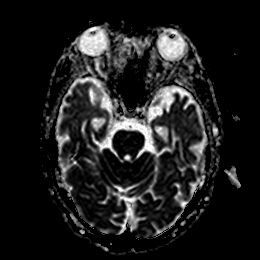
[im 33/50]
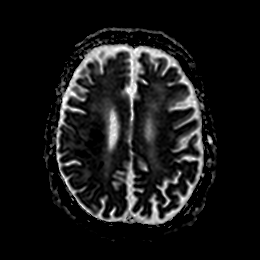
[im 50/50]
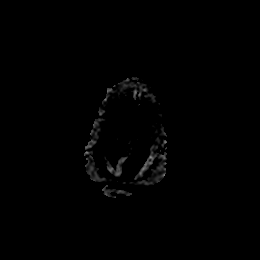

[Series 7: DWI · coronal · 4.0mm · 0.88mm/px · 5 of 70 slices shown (3 of 4)]
[im 1/70]
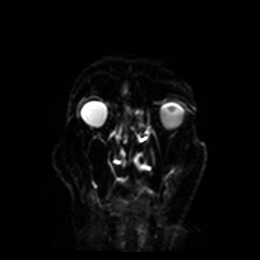
[im 18/70]
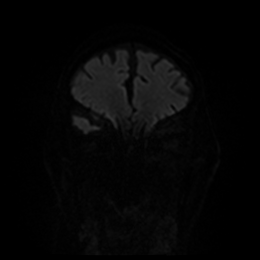
[im 35/70]
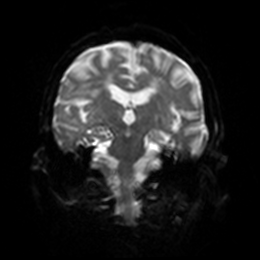
[im 52/70]
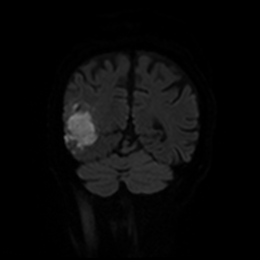
[im 70/70]
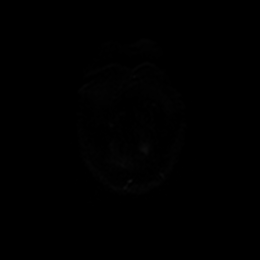

[Series 8: DWI · coronal · 4.0mm · 0.88mm/px · 3 of 35 slices shown (4 of 4)]
[im 1/35]
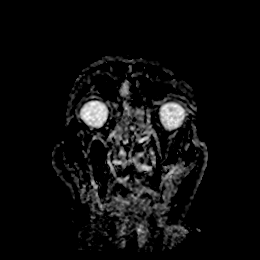
[im 18/35]
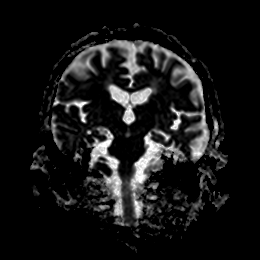
[im 35/35]
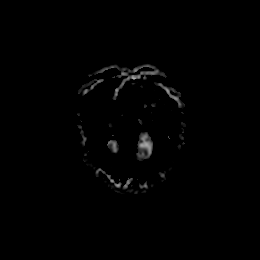

[Series 9: FLAIR · axial · 5.0mm · 0.90mm/px · z∈[-89,+51]mm · 2 of 25 slices shown]
[im 1/25]
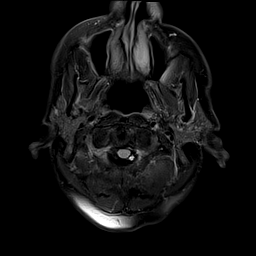
[im 25/25]
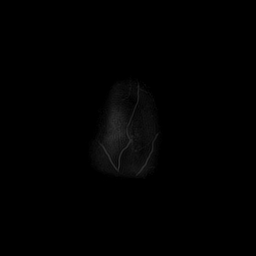

[Series 10: mag_images · axial · 3.0mm · 0.90mm/px · z∈[-91,+57]mm · 4 of 52 slices shown]
[im 1/52]
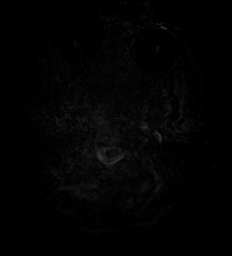
[im 18/52]
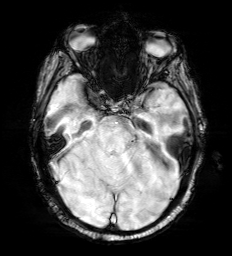
[im 35/52]
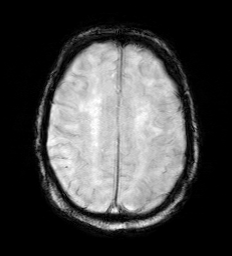
[im 52/52]
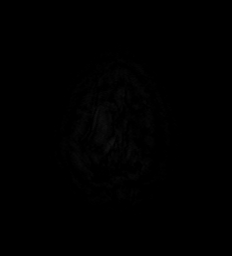

[Series 11: pha_images · axial · 3.0mm · 0.90mm/px · z∈[-91,+57]mm · 4 of 52 slices shown]
[im 1/52]
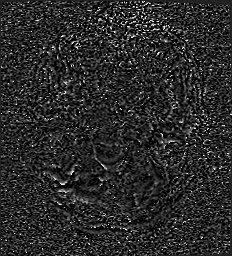
[im 18/52]
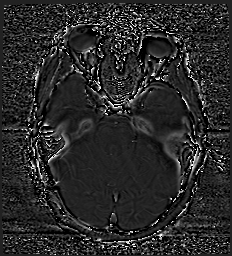
[im 35/52]
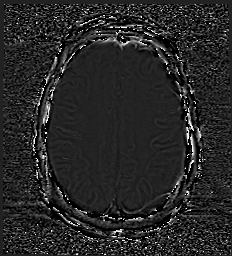
[im 52/52]
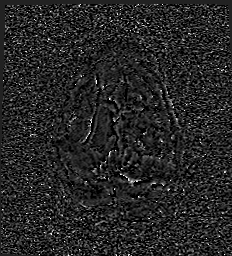

[Series 12: swi_images · axial · 3.0mm · 0.90mm/px · z∈[-91,+57]mm · 4 of 52 slices shown]
[im 1/52]
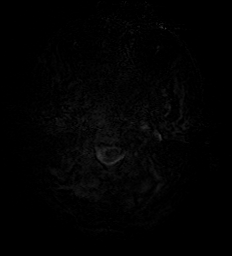
[im 18/52]
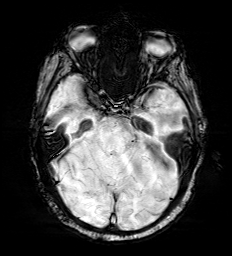
[im 35/52]
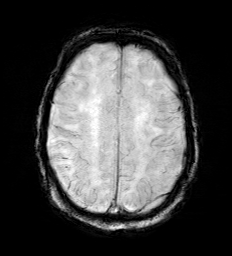
[im 52/52]
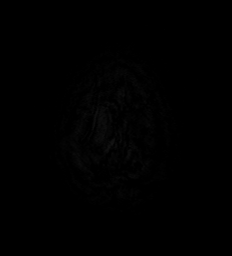

[Series 13: mip_images(sw) · axial · 24.0mm · 0.90mm/px · z∈[-81,+47]mm · 4 of 45 slices shown]
[im 1/45]
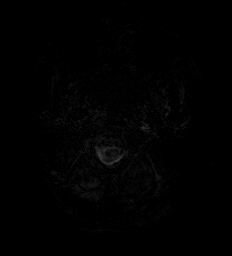
[im 15/45]
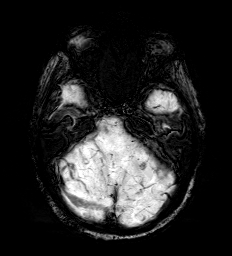
[im 30/45]
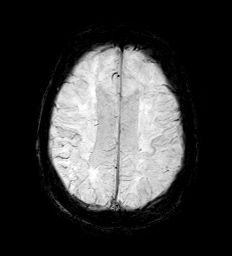
[im 45/45]
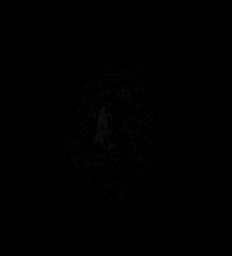

[Series 14: T2 · axial · 5.0mm · 0.72mm/px · z∈[-87,+52]mm · 2 of 25 slices shown (1 of 2)]
[im 1/25]
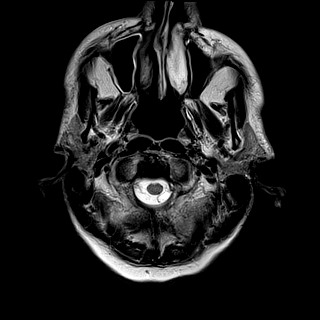
[im 25/25]
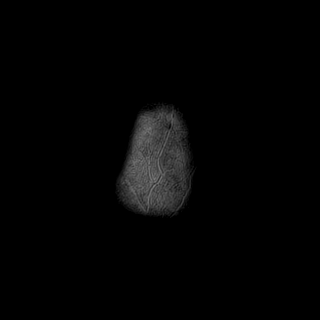

[Series 15: T1 · sagittal · 5.0mm · 0.75mm/px · 2 of 26 slices shown]
[im 1/26]
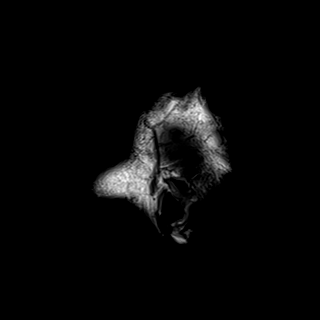
[im 26/26]
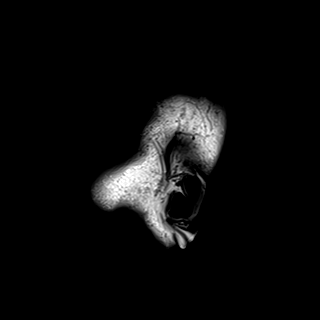

[Series 17: T2 · coronal · 5.0mm · 0.34mm/px · 2 of 31 slices shown (2 of 2)]
[im 1/31]
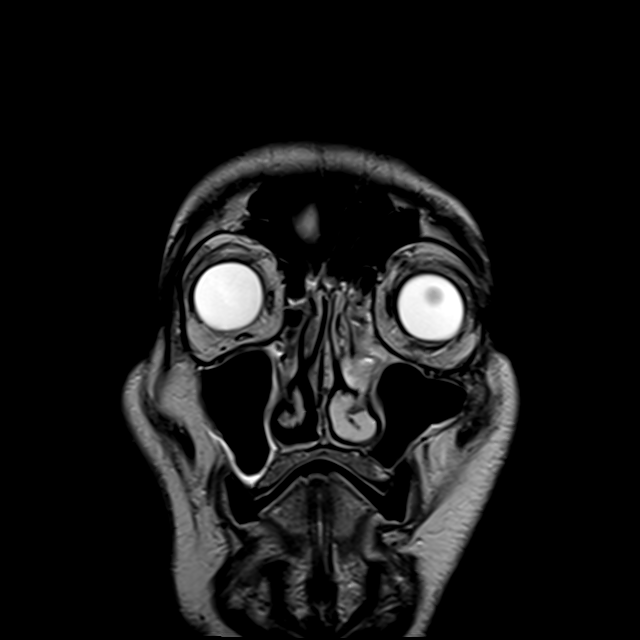
[im 31/31]
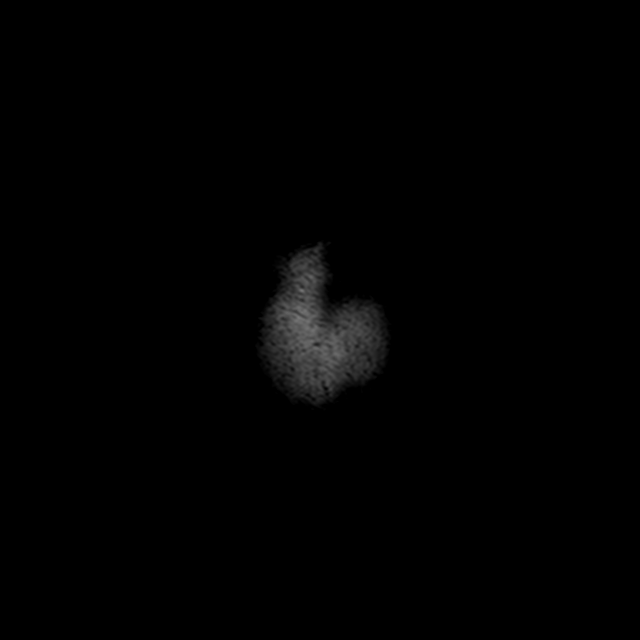

[44 of 48 positions shown; findings below may reference images not displayed]

FINDINGS: Brain: Widespread patchy and confluent restricted diffusion in the
posterior right MCA territory relatively sparing the superior
perirolandic cortex. Patchy white matter and gray matter
involvement, including the posterior right insula. The infarct is
most confluent in a 7 cm area of the the right MCA/PCA watershed
territory (series 5, image 72). No deep gray nuclei involvement.

Cytotoxic edema with confluent T2 and FLAIR hyperintensity. Some of
the affected parenchyma is T1 hypointense. Questionable petechial
hemorrhage on SWI but no malignant hemorrhagic transformation. No
mass effect.

No restricted diffusion elsewhere. No midline shift, mass effect,
evidence of mass lesion, ventriculomegaly, extra-axial collection.
No extra-axial or intraventricular blood. Cervicomedullary junction
and pituitary are within normal limits.

Patchy chronic bilateral white matter T2 and FLAIR hyperintensity in
both hemispheres has mildly progressed since 5034. No definite
chronic cortical encephalomalacia. Subtle chronic infarcts in the
cerebellum, including series 14, image 3 on the left which is new
since 5034. Occasional cerebellar microhemorrhages in the left
hemisphere.

Vascular: Chronically poor distal vertebral artery flow voids.
Evidence of reconstituted flow in the basilar, stable since 5034.
Fetal type PCA origins demonstrated previously. ICA flow voids
appears stable.

Skull and upper cervical spine: Chronic cervical spine degeneration
with mild if any visible spinal stenosis. Background bone marrow
signal within normal limits.

Sinuses/Orbits: Negative orbits. Mild paranasal sinus mucosal
thickening not significantly changed from 5034.

Other: Mastoids remain clear. Visible internal auditory structures
appear normal.

Negative scalp and face soft tissues.
IMPRESSION: 1. Acute to subacute infarct in the posterior right MCA territory
infarct, and especially at the right MCA/PCA watershed (fetal type
PCA origins, see #2). Cytotoxic edema with possible petechial
hemorrhage but no malignant hemorrhagic transformation or mass
effect.

2. Chronically poor distal vertebral artery flow with evidence of
reconstituted basilar, grossly stable since [DATE]. Mild progression of underlying chronic small vessel disease since
